# Patient Record
Sex: Female | Born: 1950 | Race: White | Hispanic: No | Marital: Married | Smoking: Never smoker
Health system: Southern US, Community
[De-identification: ages and names within clinical notes are randomized; demographics above are authoritative.]

## PROBLEM LIST (undated history)

## (undated) DIAGNOSIS — F32A Depression, unspecified: Secondary | ICD-10-CM

## (undated) DIAGNOSIS — I499 Cardiac arrhythmia, unspecified: Secondary | ICD-10-CM

## (undated) DIAGNOSIS — K219 Gastro-esophageal reflux disease without esophagitis: Secondary | ICD-10-CM

## (undated) DIAGNOSIS — M1990 Unspecified osteoarthritis, unspecified site: Secondary | ICD-10-CM

## (undated) DIAGNOSIS — F419 Anxiety disorder, unspecified: Secondary | ICD-10-CM

## (undated) DIAGNOSIS — M797 Fibromyalgia: Secondary | ICD-10-CM

## (undated) DIAGNOSIS — E785 Hyperlipidemia, unspecified: Secondary | ICD-10-CM

## (undated) DIAGNOSIS — M7072 Other bursitis of hip, left hip: Secondary | ICD-10-CM

## (undated) DIAGNOSIS — I493 Ventricular premature depolarization: Secondary | ICD-10-CM

## (undated) DIAGNOSIS — F329 Major depressive disorder, single episode, unspecified: Secondary | ICD-10-CM

## (undated) DIAGNOSIS — G35 Multiple sclerosis: Secondary | ICD-10-CM

## (undated) DIAGNOSIS — H9190 Unspecified hearing loss, unspecified ear: Secondary | ICD-10-CM

## (undated) DIAGNOSIS — N814 Uterovaginal prolapse, unspecified: Secondary | ICD-10-CM

## (undated) DIAGNOSIS — H9319 Tinnitus, unspecified ear: Secondary | ICD-10-CM

## (undated) DIAGNOSIS — M5430 Sciatica, unspecified side: Secondary | ICD-10-CM

## (undated) DIAGNOSIS — E039 Hypothyroidism, unspecified: Secondary | ICD-10-CM

## (undated) DIAGNOSIS — I10 Essential (primary) hypertension: Secondary | ICD-10-CM

## (undated) HISTORY — DX: Ventricular premature depolarization: I49.3

## (undated) HISTORY — DX: Sciatica, unspecified side: M54.30

## (undated) HISTORY — DX: Unspecified hearing loss, unspecified ear: H91.90

## (undated) HISTORY — DX: Other bursitis of hip, left hip: M70.72

## (undated) HISTORY — DX: Hyperlipidemia, unspecified: E78.5

## (undated) HISTORY — DX: Essential (primary) hypertension: I10

## (undated) HISTORY — DX: Tinnitus, unspecified ear: H93.19

## (undated) HISTORY — DX: Uterovaginal prolapse, unspecified: N81.4

## (undated) HISTORY — DX: Fibromyalgia: M79.7

## (undated) HISTORY — PX: CATARACT EXTRACTION, BILATERAL: SHX1313

## (undated) HISTORY — DX: Anxiety disorder, unspecified: F41.9

## (undated) HISTORY — DX: Gastro-esophageal reflux disease without esophagitis: K21.9

## (undated) HISTORY — DX: Unspecified osteoarthritis, unspecified site: M19.90

## (undated) HISTORY — DX: Major depressive disorder, single episode, unspecified: F32.9

## (undated) HISTORY — DX: Depression, unspecified: F32.A

## (undated) HISTORY — PX: PARTIAL HYSTERECTOMY: SHX80

## (undated) HISTORY — PX: OTHER SURGICAL HISTORY: SHX169

## (undated) HISTORY — DX: Multiple sclerosis: G35

---

## 1998-06-22 ENCOUNTER — Encounter: Admission: RE | Admit: 1998-06-22 | Discharge: 1998-07-19 | Payer: Self-pay | Admitting: Internal Medicine

## 1998-07-23 ENCOUNTER — Encounter: Admission: RE | Admit: 1998-07-23 | Discharge: 1998-08-29 | Payer: Self-pay

## 1999-10-10 ENCOUNTER — Encounter: Payer: Self-pay | Admitting: Obstetrics and Gynecology

## 1999-10-10 ENCOUNTER — Encounter: Admission: RE | Admit: 1999-10-10 | Discharge: 1999-10-10 | Payer: Self-pay | Admitting: Obstetrics and Gynecology

## 2000-04-29 ENCOUNTER — Encounter: Admission: RE | Admit: 2000-04-29 | Discharge: 2000-07-28 | Payer: Self-pay | Admitting: Internal Medicine

## 2000-11-13 ENCOUNTER — Encounter: Admission: RE | Admit: 2000-11-13 | Discharge: 2000-11-13 | Payer: Self-pay | Admitting: Obstetrics and Gynecology

## 2000-11-13 ENCOUNTER — Encounter: Payer: Self-pay | Admitting: Obstetrics and Gynecology

## 2002-05-24 ENCOUNTER — Encounter: Admission: RE | Admit: 2002-05-24 | Discharge: 2002-05-24 | Payer: Self-pay | Admitting: Cardiology

## 2002-05-24 ENCOUNTER — Encounter: Payer: Self-pay | Admitting: Cardiology

## 2002-05-24 HISTORY — PX: BREAST BIOPSY: SHX20

## 2004-07-26 ENCOUNTER — Encounter: Admission: RE | Admit: 2004-07-26 | Discharge: 2004-07-26 | Payer: Self-pay | Admitting: Obstetrics and Gynecology

## 2008-06-28 ENCOUNTER — Encounter: Payer: Self-pay | Admitting: *Deleted

## 2009-03-05 ENCOUNTER — Encounter: Admission: RE | Admit: 2009-03-05 | Discharge: 2009-03-05 | Payer: Self-pay | Admitting: Internal Medicine

## 2009-08-30 ENCOUNTER — Encounter: Admission: RE | Admit: 2009-08-30 | Discharge: 2009-08-30 | Payer: Self-pay | Admitting: Obstetrics and Gynecology

## 2010-10-07 ENCOUNTER — Other Ambulatory Visit: Payer: Self-pay | Admitting: Internal Medicine

## 2010-10-07 DIAGNOSIS — Z1231 Encounter for screening mammogram for malignant neoplasm of breast: Secondary | ICD-10-CM

## 2010-10-09 ENCOUNTER — Ambulatory Visit
Admission: RE | Admit: 2010-10-09 | Discharge: 2010-10-09 | Disposition: A | Discharge status: Home / Self Care | Payer: BC Managed Care – PPO | Source: Ambulatory Visit | Attending: Internal Medicine | Admitting: Internal Medicine

## 2010-10-09 DIAGNOSIS — Z1231 Encounter for screening mammogram for malignant neoplasm of breast: Secondary | ICD-10-CM

## 2011-05-21 ENCOUNTER — Encounter: Payer: Self-pay | Admitting: *Deleted

## 2011-07-10 ENCOUNTER — Other Ambulatory Visit: Payer: Self-pay | Admitting: Cardiology

## 2011-11-17 ENCOUNTER — Other Ambulatory Visit: Payer: Self-pay | Admitting: Internal Medicine

## 2011-11-17 DIAGNOSIS — Z1231 Encounter for screening mammogram for malignant neoplasm of breast: Secondary | ICD-10-CM

## 2012-01-02 ENCOUNTER — Ambulatory Visit
Admission: RE | Admit: 2012-01-02 | Discharge: 2012-01-02 | Disposition: A | Discharge status: Home / Self Care | Payer: BC Managed Care – PPO | Source: Ambulatory Visit | Attending: Internal Medicine | Admitting: Internal Medicine

## 2012-01-02 DIAGNOSIS — Z1231 Encounter for screening mammogram for malignant neoplasm of breast: Secondary | ICD-10-CM

## 2012-08-13 DIAGNOSIS — S20219A Contusion of unspecified front wall of thorax, initial encounter: Secondary | ICD-10-CM | POA: Diagnosis not present

## 2012-10-05 DIAGNOSIS — F410 Panic disorder [episodic paroxysmal anxiety] without agoraphobia: Secondary | ICD-10-CM | POA: Diagnosis not present

## 2012-10-05 DIAGNOSIS — IMO0001 Reserved for inherently not codable concepts without codable children: Secondary | ICD-10-CM | POA: Diagnosis not present

## 2012-10-05 DIAGNOSIS — R5382 Chronic fatigue, unspecified: Secondary | ICD-10-CM | POA: Diagnosis not present

## 2012-10-05 DIAGNOSIS — Z79899 Other long term (current) drug therapy: Secondary | ICD-10-CM | POA: Diagnosis not present

## 2012-10-05 DIAGNOSIS — E7800 Pure hypercholesterolemia, unspecified: Secondary | ICD-10-CM | POA: Diagnosis not present

## 2012-10-05 DIAGNOSIS — D509 Iron deficiency anemia, unspecified: Secondary | ICD-10-CM | POA: Diagnosis not present

## 2012-11-02 DIAGNOSIS — Z1211 Encounter for screening for malignant neoplasm of colon: Secondary | ICD-10-CM | POA: Diagnosis not present

## 2012-11-08 DIAGNOSIS — K219 Gastro-esophageal reflux disease without esophagitis: Secondary | ICD-10-CM | POA: Diagnosis not present

## 2012-11-08 DIAGNOSIS — R5382 Chronic fatigue, unspecified: Secondary | ICD-10-CM | POA: Diagnosis not present

## 2012-11-08 DIAGNOSIS — Z79899 Other long term (current) drug therapy: Secondary | ICD-10-CM | POA: Diagnosis not present

## 2012-11-08 DIAGNOSIS — Z1331 Encounter for screening for depression: Secondary | ICD-10-CM | POA: Diagnosis not present

## 2012-11-08 DIAGNOSIS — Z0000 Encounter for general adult medical examination without abnormal findings: Secondary | ICD-10-CM | POA: Diagnosis not present

## 2012-11-08 DIAGNOSIS — E039 Hypothyroidism, unspecified: Secondary | ICD-10-CM | POA: Diagnosis not present

## 2012-11-08 DIAGNOSIS — E7800 Pure hypercholesterolemia, unspecified: Secondary | ICD-10-CM | POA: Diagnosis not present

## 2012-11-08 DIAGNOSIS — IMO0001 Reserved for inherently not codable concepts without codable children: Secondary | ICD-10-CM | POA: Diagnosis not present

## 2012-11-08 DIAGNOSIS — B351 Tinea unguium: Secondary | ICD-10-CM | POA: Diagnosis not present

## 2012-11-08 DIAGNOSIS — D509 Iron deficiency anemia, unspecified: Secondary | ICD-10-CM | POA: Diagnosis not present

## 2012-11-24 DIAGNOSIS — M899 Disorder of bone, unspecified: Secondary | ICD-10-CM | POA: Diagnosis not present

## 2012-11-24 DIAGNOSIS — IMO0001 Reserved for inherently not codable concepts without codable children: Secondary | ICD-10-CM | POA: Diagnosis not present

## 2012-11-24 DIAGNOSIS — Z79899 Other long term (current) drug therapy: Secondary | ICD-10-CM | POA: Diagnosis not present

## 2012-12-08 ENCOUNTER — Other Ambulatory Visit: Payer: Self-pay

## 2012-12-08 DIAGNOSIS — Z1231 Encounter for screening mammogram for malignant neoplasm of breast: Secondary | ICD-10-CM

## 2012-12-23 DIAGNOSIS — E7800 Pure hypercholesterolemia, unspecified: Secondary | ICD-10-CM | POA: Diagnosis not present

## 2012-12-23 DIAGNOSIS — Z79899 Other long term (current) drug therapy: Secondary | ICD-10-CM | POA: Diagnosis not present

## 2013-01-17 ENCOUNTER — Ambulatory Visit
Admission: RE | Admit: 2013-01-17 | Discharge: 2013-01-17 | Disposition: A | Discharge status: Home / Self Care | Payer: Medicare Other | Source: Ambulatory Visit

## 2013-01-17 DIAGNOSIS — Z1231 Encounter for screening mammogram for malignant neoplasm of breast: Secondary | ICD-10-CM | POA: Diagnosis not present

## 2013-02-17 DIAGNOSIS — M5137 Other intervertebral disc degeneration, lumbosacral region: Secondary | ICD-10-CM | POA: Diagnosis not present

## 2013-03-07 DIAGNOSIS — H2510 Age-related nuclear cataract, unspecified eye: Secondary | ICD-10-CM | POA: Diagnosis not present

## 2013-07-05 DIAGNOSIS — E039 Hypothyroidism, unspecified: Secondary | ICD-10-CM | POA: Diagnosis not present

## 2013-07-05 DIAGNOSIS — E669 Obesity, unspecified: Secondary | ICD-10-CM | POA: Diagnosis not present

## 2013-07-05 DIAGNOSIS — IMO0001 Reserved for inherently not codable concepts without codable children: Secondary | ICD-10-CM | POA: Diagnosis not present

## 2013-08-29 DIAGNOSIS — E039 Hypothyroidism, unspecified: Secondary | ICD-10-CM | POA: Diagnosis not present

## 2013-08-29 DIAGNOSIS — E669 Obesity, unspecified: Secondary | ICD-10-CM | POA: Diagnosis not present

## 2013-08-29 DIAGNOSIS — Z6827 Body mass index (BMI) 27.0-27.9, adult: Secondary | ICD-10-CM | POA: Diagnosis not present

## 2013-08-29 DIAGNOSIS — IMO0001 Reserved for inherently not codable concepts without codable children: Secondary | ICD-10-CM | POA: Diagnosis not present

## 2013-09-29 DIAGNOSIS — IMO0001 Reserved for inherently not codable concepts without codable children: Secondary | ICD-10-CM | POA: Diagnosis not present

## 2013-09-29 DIAGNOSIS — W19XXXA Unspecified fall, initial encounter: Secondary | ICD-10-CM | POA: Diagnosis not present

## 2013-12-20 DIAGNOSIS — R002 Palpitations: Secondary | ICD-10-CM | POA: Diagnosis not present

## 2013-12-20 DIAGNOSIS — D509 Iron deficiency anemia, unspecified: Secondary | ICD-10-CM | POA: Diagnosis not present

## 2013-12-20 DIAGNOSIS — Z23 Encounter for immunization: Secondary | ICD-10-CM | POA: Diagnosis not present

## 2013-12-20 DIAGNOSIS — M797 Fibromyalgia: Secondary | ICD-10-CM | POA: Diagnosis not present

## 2013-12-20 DIAGNOSIS — E780 Pure hypercholesterolemia: Secondary | ICD-10-CM | POA: Diagnosis not present

## 2013-12-20 DIAGNOSIS — B351 Tinea unguium: Secondary | ICD-10-CM | POA: Diagnosis not present

## 2013-12-20 DIAGNOSIS — E039 Hypothyroidism, unspecified: Secondary | ICD-10-CM | POA: Diagnosis not present

## 2013-12-20 DIAGNOSIS — M353 Polymyalgia rheumatica: Secondary | ICD-10-CM | POA: Diagnosis not present

## 2013-12-20 DIAGNOSIS — F410 Panic disorder [episodic paroxysmal anxiety] without agoraphobia: Secondary | ICD-10-CM | POA: Diagnosis not present

## 2013-12-20 DIAGNOSIS — Z0001 Encounter for general adult medical examination with abnormal findings: Secondary | ICD-10-CM | POA: Diagnosis not present

## 2013-12-21 DIAGNOSIS — H524 Presbyopia: Secondary | ICD-10-CM | POA: Diagnosis not present

## 2013-12-21 DIAGNOSIS — H5201 Hypermetropia, right eye: Secondary | ICD-10-CM | POA: Diagnosis not present

## 2013-12-21 DIAGNOSIS — H52223 Regular astigmatism, bilateral: Secondary | ICD-10-CM | POA: Diagnosis not present

## 2014-01-12 DIAGNOSIS — Z79899 Other long term (current) drug therapy: Secondary | ICD-10-CM | POA: Diagnosis not present

## 2014-01-12 DIAGNOSIS — B351 Tinea unguium: Secondary | ICD-10-CM | POA: Diagnosis not present

## 2014-02-08 DIAGNOSIS — B351 Tinea unguium: Secondary | ICD-10-CM | POA: Diagnosis not present

## 2014-02-08 DIAGNOSIS — Z79899 Other long term (current) drug therapy: Secondary | ICD-10-CM | POA: Diagnosis not present

## 2014-04-24 DIAGNOSIS — E039 Hypothyroidism, unspecified: Secondary | ICD-10-CM | POA: Diagnosis not present

## 2014-04-24 DIAGNOSIS — M797 Fibromyalgia: Secondary | ICD-10-CM | POA: Diagnosis not present

## 2014-04-24 DIAGNOSIS — R030 Elevated blood-pressure reading, without diagnosis of hypertension: Secondary | ICD-10-CM | POA: Diagnosis not present

## 2014-04-24 DIAGNOSIS — K219 Gastro-esophageal reflux disease without esophagitis: Secondary | ICD-10-CM | POA: Diagnosis not present

## 2014-04-24 DIAGNOSIS — F410 Panic disorder [episodic paroxysmal anxiety] without agoraphobia: Secondary | ICD-10-CM | POA: Diagnosis not present

## 2014-04-24 DIAGNOSIS — B351 Tinea unguium: Secondary | ICD-10-CM | POA: Diagnosis not present

## 2014-05-18 DIAGNOSIS — M5442 Lumbago with sciatica, left side: Secondary | ICD-10-CM | POA: Diagnosis not present

## 2014-05-18 DIAGNOSIS — M5136 Other intervertebral disc degeneration, lumbar region: Secondary | ICD-10-CM | POA: Diagnosis not present

## 2014-05-26 DIAGNOSIS — M5136 Other intervertebral disc degeneration, lumbar region: Secondary | ICD-10-CM | POA: Diagnosis not present

## 2014-06-01 DIAGNOSIS — M5136 Other intervertebral disc degeneration, lumbar region: Secondary | ICD-10-CM | POA: Diagnosis not present

## 2014-06-14 DIAGNOSIS — R1012 Left upper quadrant pain: Secondary | ICD-10-CM | POA: Diagnosis not present

## 2014-09-25 DIAGNOSIS — M5442 Lumbago with sciatica, left side: Secondary | ICD-10-CM | POA: Diagnosis not present

## 2014-09-25 DIAGNOSIS — M5136 Other intervertebral disc degeneration, lumbar region: Secondary | ICD-10-CM | POA: Diagnosis not present

## 2014-10-18 DIAGNOSIS — M5136 Other intervertebral disc degeneration, lumbar region: Secondary | ICD-10-CM | POA: Diagnosis not present

## 2015-01-16 ENCOUNTER — Other Ambulatory Visit: Payer: Self-pay | Admitting: Internal Medicine

## 2015-01-16 DIAGNOSIS — Z23 Encounter for immunization: Secondary | ICD-10-CM | POA: Diagnosis not present

## 2015-01-16 DIAGNOSIS — Z1231 Encounter for screening mammogram for malignant neoplasm of breast: Secondary | ICD-10-CM

## 2015-01-16 DIAGNOSIS — Z79899 Other long term (current) drug therapy: Secondary | ICD-10-CM | POA: Diagnosis not present

## 2015-01-16 DIAGNOSIS — H9190 Unspecified hearing loss, unspecified ear: Secondary | ICD-10-CM | POA: Diagnosis not present

## 2015-01-16 DIAGNOSIS — B351 Tinea unguium: Secondary | ICD-10-CM | POA: Diagnosis not present

## 2015-01-16 DIAGNOSIS — D509 Iron deficiency anemia, unspecified: Secondary | ICD-10-CM | POA: Diagnosis not present

## 2015-01-16 DIAGNOSIS — E7800 Pure hypercholesterolemia, unspecified: Secondary | ICD-10-CM | POA: Diagnosis not present

## 2015-01-16 DIAGNOSIS — M5431 Sciatica, right side: Secondary | ICD-10-CM | POA: Diagnosis not present

## 2015-01-16 DIAGNOSIS — M797 Fibromyalgia: Secondary | ICD-10-CM | POA: Diagnosis not present

## 2015-01-16 DIAGNOSIS — F329 Major depressive disorder, single episode, unspecified: Secondary | ICD-10-CM | POA: Diagnosis not present

## 2015-01-16 DIAGNOSIS — Z0001 Encounter for general adult medical examination with abnormal findings: Secondary | ICD-10-CM | POA: Diagnosis not present

## 2015-01-16 DIAGNOSIS — H9319 Tinnitus, unspecified ear: Secondary | ICD-10-CM | POA: Diagnosis not present

## 2015-01-16 DIAGNOSIS — Z0000 Encounter for general adult medical examination without abnormal findings: Secondary | ICD-10-CM | POA: Diagnosis not present

## 2015-01-16 DIAGNOSIS — E039 Hypothyroidism, unspecified: Secondary | ICD-10-CM | POA: Diagnosis not present

## 2015-01-18 DIAGNOSIS — M5431 Sciatica, right side: Secondary | ICD-10-CM | POA: Diagnosis not present

## 2015-01-22 DIAGNOSIS — M5431 Sciatica, right side: Secondary | ICD-10-CM | POA: Diagnosis not present

## 2015-01-29 DIAGNOSIS — M5431 Sciatica, right side: Secondary | ICD-10-CM | POA: Diagnosis not present

## 2015-01-31 DIAGNOSIS — M5431 Sciatica, right side: Secondary | ICD-10-CM | POA: Diagnosis not present

## 2015-02-09 DIAGNOSIS — M5431 Sciatica, right side: Secondary | ICD-10-CM | POA: Diagnosis not present

## 2015-02-19 ENCOUNTER — Ambulatory Visit
Admission: RE | Admit: 2015-02-19 | Discharge: 2015-02-19 | Disposition: A | Discharge status: Home / Self Care | Payer: Medicare Other | Source: Ambulatory Visit | Attending: Internal Medicine | Admitting: Internal Medicine

## 2015-02-19 DIAGNOSIS — M8589 Other specified disorders of bone density and structure, multiple sites: Secondary | ICD-10-CM | POA: Diagnosis not present

## 2015-02-19 DIAGNOSIS — M859 Disorder of bone density and structure, unspecified: Secondary | ICD-10-CM | POA: Diagnosis not present

## 2015-02-19 DIAGNOSIS — Z1231 Encounter for screening mammogram for malignant neoplasm of breast: Secondary | ICD-10-CM

## 2015-03-22 DIAGNOSIS — H5201 Hypermetropia, right eye: Secondary | ICD-10-CM | POA: Diagnosis not present

## 2015-03-22 DIAGNOSIS — H524 Presbyopia: Secondary | ICD-10-CM | POA: Diagnosis not present

## 2015-03-22 DIAGNOSIS — H52223 Regular astigmatism, bilateral: Secondary | ICD-10-CM | POA: Diagnosis not present

## 2015-03-22 DIAGNOSIS — H5212 Myopia, left eye: Secondary | ICD-10-CM | POA: Diagnosis not present

## 2015-03-22 DIAGNOSIS — H2513 Age-related nuclear cataract, bilateral: Secondary | ICD-10-CM | POA: Diagnosis not present

## 2015-03-28 DIAGNOSIS — R079 Chest pain, unspecified: Secondary | ICD-10-CM | POA: Diagnosis not present

## 2015-03-28 DIAGNOSIS — M5431 Sciatica, right side: Secondary | ICD-10-CM | POA: Diagnosis not present

## 2015-04-03 ENCOUNTER — Other Ambulatory Visit: Payer: Self-pay | Admitting: Internal Medicine

## 2015-04-03 ENCOUNTER — Ambulatory Visit
Admission: RE | Admit: 2015-04-03 | Discharge: 2015-04-03 | Disposition: A | Discharge status: Home / Self Care | Payer: Medicare Other | Source: Ambulatory Visit | Attending: Internal Medicine | Admitting: Internal Medicine

## 2015-04-03 DIAGNOSIS — R079 Chest pain, unspecified: Secondary | ICD-10-CM

## 2015-04-03 DIAGNOSIS — S299XXA Unspecified injury of thorax, initial encounter: Secondary | ICD-10-CM | POA: Diagnosis not present

## 2015-04-03 DIAGNOSIS — R0781 Pleurodynia: Secondary | ICD-10-CM | POA: Diagnosis not present

## 2015-06-05 ENCOUNTER — Other Ambulatory Visit: Payer: Self-pay | Admitting: Internal Medicine

## 2015-06-05 DIAGNOSIS — M5416 Radiculopathy, lumbar region: Secondary | ICD-10-CM

## 2015-06-16 ENCOUNTER — Ambulatory Visit
Admission: RE | Admit: 2015-06-16 | Discharge: 2015-06-16 | Disposition: A | Discharge status: Home / Self Care | Payer: Medicare Other | Source: Ambulatory Visit | Attending: Internal Medicine | Admitting: Internal Medicine

## 2015-06-16 DIAGNOSIS — M5416 Radiculopathy, lumbar region: Secondary | ICD-10-CM

## 2015-06-16 DIAGNOSIS — M4806 Spinal stenosis, lumbar region: Secondary | ICD-10-CM | POA: Diagnosis not present

## 2015-06-21 ENCOUNTER — Other Ambulatory Visit: Payer: Self-pay | Admitting: Internal Medicine

## 2015-06-21 DIAGNOSIS — M5124 Other intervertebral disc displacement, thoracic region: Secondary | ICD-10-CM

## 2015-06-28 ENCOUNTER — Ambulatory Visit
Admission: RE | Admit: 2015-06-28 | Discharge: 2015-06-28 | Disposition: A | Discharge status: Home / Self Care | Payer: Medicare Other | Source: Ambulatory Visit | Attending: Internal Medicine | Admitting: Internal Medicine

## 2015-06-28 DIAGNOSIS — M5124 Other intervertebral disc displacement, thoracic region: Secondary | ICD-10-CM | POA: Diagnosis not present

## 2015-07-02 DIAGNOSIS — M546 Pain in thoracic spine: Secondary | ICD-10-CM | POA: Diagnosis not present

## 2015-09-26 DIAGNOSIS — M25552 Pain in left hip: Secondary | ICD-10-CM | POA: Diagnosis not present

## 2015-09-26 DIAGNOSIS — M545 Low back pain: Secondary | ICD-10-CM | POA: Diagnosis not present

## 2015-09-26 DIAGNOSIS — M791 Myalgia: Secondary | ICD-10-CM | POA: Diagnosis not present

## 2015-09-26 DIAGNOSIS — M542 Cervicalgia: Secondary | ICD-10-CM | POA: Diagnosis not present

## 2015-09-26 DIAGNOSIS — M50221 Other cervical disc displacement at C4-C5 level: Secondary | ICD-10-CM | POA: Diagnosis not present

## 2015-09-26 DIAGNOSIS — M25551 Pain in right hip: Secondary | ICD-10-CM | POA: Diagnosis not present

## 2015-09-26 DIAGNOSIS — M6281 Muscle weakness (generalized): Secondary | ICD-10-CM | POA: Diagnosis not present

## 2015-09-26 DIAGNOSIS — M47812 Spondylosis without myelopathy or radiculopathy, cervical region: Secondary | ICD-10-CM | POA: Diagnosis not present

## 2015-10-15 DIAGNOSIS — M791 Myalgia: Secondary | ICD-10-CM | POA: Diagnosis not present

## 2015-10-15 DIAGNOSIS — M47812 Spondylosis without myelopathy or radiculopathy, cervical region: Secondary | ICD-10-CM | POA: Diagnosis not present

## 2015-10-15 DIAGNOSIS — M542 Cervicalgia: Secondary | ICD-10-CM | POA: Diagnosis not present

## 2015-10-15 DIAGNOSIS — M25552 Pain in left hip: Secondary | ICD-10-CM | POA: Diagnosis not present

## 2015-10-15 DIAGNOSIS — M50221 Other cervical disc displacement at C4-C5 level: Secondary | ICD-10-CM | POA: Diagnosis not present

## 2015-10-15 DIAGNOSIS — M6281 Muscle weakness (generalized): Secondary | ICD-10-CM | POA: Diagnosis not present

## 2015-10-15 DIAGNOSIS — M545 Low back pain: Secondary | ICD-10-CM | POA: Diagnosis not present

## 2015-10-15 DIAGNOSIS — M25551 Pain in right hip: Secondary | ICD-10-CM | POA: Diagnosis not present

## 2015-10-17 DIAGNOSIS — M461 Sacroiliitis, not elsewhere classified: Secondary | ICD-10-CM | POA: Diagnosis not present

## 2015-10-17 DIAGNOSIS — M47812 Spondylosis without myelopathy or radiculopathy, cervical region: Secondary | ICD-10-CM | POA: Diagnosis not present

## 2015-10-17 DIAGNOSIS — M50221 Other cervical disc displacement at C4-C5 level: Secondary | ICD-10-CM | POA: Diagnosis not present

## 2015-10-17 DIAGNOSIS — M6281 Muscle weakness (generalized): Secondary | ICD-10-CM | POA: Diagnosis not present

## 2015-10-17 DIAGNOSIS — M791 Myalgia: Secondary | ICD-10-CM | POA: Diagnosis not present

## 2015-10-22 DIAGNOSIS — M791 Myalgia: Secondary | ICD-10-CM | POA: Diagnosis not present

## 2015-10-22 DIAGNOSIS — M50221 Other cervical disc displacement at C4-C5 level: Secondary | ICD-10-CM | POA: Diagnosis not present

## 2015-10-22 DIAGNOSIS — M47812 Spondylosis without myelopathy or radiculopathy, cervical region: Secondary | ICD-10-CM | POA: Diagnosis not present

## 2015-10-22 DIAGNOSIS — M542 Cervicalgia: Secondary | ICD-10-CM | POA: Diagnosis not present

## 2015-10-22 DIAGNOSIS — M6281 Muscle weakness (generalized): Secondary | ICD-10-CM | POA: Diagnosis not present

## 2015-10-24 DIAGNOSIS — M50221 Other cervical disc displacement at C4-C5 level: Secondary | ICD-10-CM | POA: Diagnosis not present

## 2015-10-24 DIAGNOSIS — M6281 Muscle weakness (generalized): Secondary | ICD-10-CM | POA: Diagnosis not present

## 2015-10-24 DIAGNOSIS — M47812 Spondylosis without myelopathy or radiculopathy, cervical region: Secondary | ICD-10-CM | POA: Diagnosis not present

## 2015-10-24 DIAGNOSIS — M791 Myalgia: Secondary | ICD-10-CM | POA: Diagnosis not present

## 2015-10-24 DIAGNOSIS — M461 Sacroiliitis, not elsewhere classified: Secondary | ICD-10-CM | POA: Diagnosis not present

## 2015-10-28 DIAGNOSIS — S61412A Laceration without foreign body of left hand, initial encounter: Secondary | ICD-10-CM | POA: Diagnosis not present

## 2015-10-29 DIAGNOSIS — M6281 Muscle weakness (generalized): Secondary | ICD-10-CM | POA: Diagnosis not present

## 2015-10-29 DIAGNOSIS — M791 Myalgia: Secondary | ICD-10-CM | POA: Diagnosis not present

## 2015-10-29 DIAGNOSIS — M50221 Other cervical disc displacement at C4-C5 level: Secondary | ICD-10-CM | POA: Diagnosis not present

## 2015-10-29 DIAGNOSIS — M542 Cervicalgia: Secondary | ICD-10-CM | POA: Diagnosis not present

## 2015-10-29 DIAGNOSIS — M47812 Spondylosis without myelopathy or radiculopathy, cervical region: Secondary | ICD-10-CM | POA: Diagnosis not present

## 2015-10-31 DIAGNOSIS — M6281 Muscle weakness (generalized): Secondary | ICD-10-CM | POA: Diagnosis not present

## 2015-10-31 DIAGNOSIS — M791 Myalgia: Secondary | ICD-10-CM | POA: Diagnosis not present

## 2015-10-31 DIAGNOSIS — M47812 Spondylosis without myelopathy or radiculopathy, cervical region: Secondary | ICD-10-CM | POA: Diagnosis not present

## 2015-10-31 DIAGNOSIS — M461 Sacroiliitis, not elsewhere classified: Secondary | ICD-10-CM | POA: Diagnosis not present

## 2015-10-31 DIAGNOSIS — M50221 Other cervical disc displacement at C4-C5 level: Secondary | ICD-10-CM | POA: Diagnosis not present

## 2015-11-07 DIAGNOSIS — H833X1 Noise effects on right inner ear: Secondary | ICD-10-CM | POA: Diagnosis not present

## 2015-11-07 DIAGNOSIS — H5371 Glare sensitivity: Secondary | ICD-10-CM | POA: Diagnosis not present

## 2015-11-07 DIAGNOSIS — G44201 Tension-type headache, unspecified, intractable: Secondary | ICD-10-CM | POA: Diagnosis not present

## 2015-11-07 DIAGNOSIS — H833X2 Noise effects on left inner ear: Secondary | ICD-10-CM | POA: Diagnosis not present

## 2015-11-07 DIAGNOSIS — F411 Generalized anxiety disorder: Secondary | ICD-10-CM | POA: Diagnosis not present

## 2015-11-07 DIAGNOSIS — R42 Dizziness and giddiness: Secondary | ICD-10-CM | POA: Diagnosis not present

## 2015-11-12 DIAGNOSIS — M791 Myalgia: Secondary | ICD-10-CM | POA: Diagnosis not present

## 2015-11-12 DIAGNOSIS — M47812 Spondylosis without myelopathy or radiculopathy, cervical region: Secondary | ICD-10-CM | POA: Diagnosis not present

## 2015-11-12 DIAGNOSIS — M50221 Other cervical disc displacement at C4-C5 level: Secondary | ICD-10-CM | POA: Diagnosis not present

## 2015-11-12 DIAGNOSIS — M6281 Muscle weakness (generalized): Secondary | ICD-10-CM | POA: Diagnosis not present

## 2015-11-12 DIAGNOSIS — M461 Sacroiliitis, not elsewhere classified: Secondary | ICD-10-CM | POA: Diagnosis not present

## 2015-11-14 DIAGNOSIS — M50221 Other cervical disc displacement at C4-C5 level: Secondary | ICD-10-CM | POA: Diagnosis not present

## 2015-11-14 DIAGNOSIS — M542 Cervicalgia: Secondary | ICD-10-CM | POA: Diagnosis not present

## 2015-11-14 DIAGNOSIS — M791 Myalgia: Secondary | ICD-10-CM | POA: Diagnosis not present

## 2015-11-14 DIAGNOSIS — M47812 Spondylosis without myelopathy or radiculopathy, cervical region: Secondary | ICD-10-CM | POA: Diagnosis not present

## 2015-11-14 DIAGNOSIS — M6281 Muscle weakness (generalized): Secondary | ICD-10-CM | POA: Diagnosis not present

## 2015-11-19 DIAGNOSIS — M47812 Spondylosis without myelopathy or radiculopathy, cervical region: Secondary | ICD-10-CM | POA: Diagnosis not present

## 2015-11-19 DIAGNOSIS — M50221 Other cervical disc displacement at C4-C5 level: Secondary | ICD-10-CM | POA: Diagnosis not present

## 2015-11-19 DIAGNOSIS — M791 Myalgia: Secondary | ICD-10-CM | POA: Diagnosis not present

## 2015-11-19 DIAGNOSIS — M461 Sacroiliitis, not elsewhere classified: Secondary | ICD-10-CM | POA: Diagnosis not present

## 2015-11-19 DIAGNOSIS — M6281 Muscle weakness (generalized): Secondary | ICD-10-CM | POA: Diagnosis not present

## 2015-11-21 DIAGNOSIS — M6281 Muscle weakness (generalized): Secondary | ICD-10-CM | POA: Diagnosis not present

## 2015-11-21 DIAGNOSIS — M791 Myalgia: Secondary | ICD-10-CM | POA: Diagnosis not present

## 2015-11-21 DIAGNOSIS — M50221 Other cervical disc displacement at C4-C5 level: Secondary | ICD-10-CM | POA: Diagnosis not present

## 2015-11-21 DIAGNOSIS — M47812 Spondylosis without myelopathy or radiculopathy, cervical region: Secondary | ICD-10-CM | POA: Diagnosis not present

## 2015-11-26 DIAGNOSIS — M791 Myalgia: Secondary | ICD-10-CM | POA: Diagnosis not present

## 2015-11-26 DIAGNOSIS — M47812 Spondylosis without myelopathy or radiculopathy, cervical region: Secondary | ICD-10-CM | POA: Diagnosis not present

## 2015-11-26 DIAGNOSIS — M50221 Other cervical disc displacement at C4-C5 level: Secondary | ICD-10-CM | POA: Diagnosis not present

## 2015-11-26 DIAGNOSIS — M6281 Muscle weakness (generalized): Secondary | ICD-10-CM | POA: Diagnosis not present

## 2015-11-26 DIAGNOSIS — M461 Sacroiliitis, not elsewhere classified: Secondary | ICD-10-CM | POA: Diagnosis not present

## 2015-11-28 DIAGNOSIS — M791 Myalgia: Secondary | ICD-10-CM | POA: Diagnosis not present

## 2015-11-28 DIAGNOSIS — M2560 Stiffness of unspecified joint, not elsewhere classified: Secondary | ICD-10-CM | POA: Diagnosis not present

## 2015-11-28 DIAGNOSIS — M5127 Other intervertebral disc displacement, lumbosacral region: Secondary | ICD-10-CM | POA: Diagnosis not present

## 2015-11-28 DIAGNOSIS — M5137 Other intervertebral disc degeneration, lumbosacral region: Secondary | ICD-10-CM | POA: Diagnosis not present

## 2015-12-03 DIAGNOSIS — R42 Dizziness and giddiness: Secondary | ICD-10-CM | POA: Diagnosis not present

## 2015-12-03 DIAGNOSIS — E669 Obesity, unspecified: Secondary | ICD-10-CM | POA: Diagnosis not present

## 2015-12-03 DIAGNOSIS — W19XXXA Unspecified fall, initial encounter: Secondary | ICD-10-CM | POA: Diagnosis not present

## 2015-12-03 DIAGNOSIS — M2560 Stiffness of unspecified joint, not elsewhere classified: Secondary | ICD-10-CM | POA: Diagnosis not present

## 2015-12-03 DIAGNOSIS — I10 Essential (primary) hypertension: Secondary | ICD-10-CM | POA: Diagnosis not present

## 2015-12-03 DIAGNOSIS — Z79890 Hormone replacement therapy (postmenopausal): Secondary | ICD-10-CM | POA: Diagnosis not present

## 2015-12-03 DIAGNOSIS — M5137 Other intervertebral disc degeneration, lumbosacral region: Secondary | ICD-10-CM | POA: Diagnosis not present

## 2015-12-03 DIAGNOSIS — M791 Myalgia: Secondary | ICD-10-CM | POA: Diagnosis not present

## 2015-12-03 DIAGNOSIS — Z6828 Body mass index (BMI) 28.0-28.9, adult: Secondary | ICD-10-CM | POA: Diagnosis not present

## 2015-12-03 DIAGNOSIS — M797 Fibromyalgia: Secondary | ICD-10-CM | POA: Diagnosis not present

## 2015-12-03 DIAGNOSIS — M5127 Other intervertebral disc displacement, lumbosacral region: Secondary | ICD-10-CM | POA: Diagnosis not present

## 2015-12-05 DIAGNOSIS — M791 Myalgia: Secondary | ICD-10-CM | POA: Diagnosis not present

## 2015-12-05 DIAGNOSIS — M5137 Other intervertebral disc degeneration, lumbosacral region: Secondary | ICD-10-CM | POA: Diagnosis not present

## 2015-12-05 DIAGNOSIS — M2560 Stiffness of unspecified joint, not elsewhere classified: Secondary | ICD-10-CM | POA: Diagnosis not present

## 2015-12-05 DIAGNOSIS — M5127 Other intervertebral disc displacement, lumbosacral region: Secondary | ICD-10-CM | POA: Diagnosis not present

## 2015-12-10 DIAGNOSIS — M5127 Other intervertebral disc displacement, lumbosacral region: Secondary | ICD-10-CM | POA: Diagnosis not present

## 2015-12-10 DIAGNOSIS — M2560 Stiffness of unspecified joint, not elsewhere classified: Secondary | ICD-10-CM | POA: Diagnosis not present

## 2015-12-10 DIAGNOSIS — M791 Myalgia: Secondary | ICD-10-CM | POA: Diagnosis not present

## 2015-12-10 DIAGNOSIS — M5137 Other intervertebral disc degeneration, lumbosacral region: Secondary | ICD-10-CM | POA: Diagnosis not present

## 2015-12-12 DIAGNOSIS — M791 Myalgia: Secondary | ICD-10-CM | POA: Diagnosis not present

## 2015-12-12 DIAGNOSIS — M5127 Other intervertebral disc displacement, lumbosacral region: Secondary | ICD-10-CM | POA: Diagnosis not present

## 2015-12-12 DIAGNOSIS — M5137 Other intervertebral disc degeneration, lumbosacral region: Secondary | ICD-10-CM | POA: Diagnosis not present

## 2015-12-12 DIAGNOSIS — M2560 Stiffness of unspecified joint, not elsewhere classified: Secondary | ICD-10-CM | POA: Diagnosis not present

## 2015-12-17 DIAGNOSIS — M5137 Other intervertebral disc degeneration, lumbosacral region: Secondary | ICD-10-CM | POA: Diagnosis not present

## 2015-12-17 DIAGNOSIS — M5127 Other intervertebral disc displacement, lumbosacral region: Secondary | ICD-10-CM | POA: Diagnosis not present

## 2015-12-17 DIAGNOSIS — M791 Myalgia: Secondary | ICD-10-CM | POA: Diagnosis not present

## 2015-12-17 DIAGNOSIS — M2560 Stiffness of unspecified joint, not elsewhere classified: Secondary | ICD-10-CM | POA: Diagnosis not present

## 2015-12-19 DIAGNOSIS — M5127 Other intervertebral disc displacement, lumbosacral region: Secondary | ICD-10-CM | POA: Diagnosis not present

## 2015-12-19 DIAGNOSIS — M2560 Stiffness of unspecified joint, not elsewhere classified: Secondary | ICD-10-CM | POA: Diagnosis not present

## 2015-12-19 DIAGNOSIS — M791 Myalgia: Secondary | ICD-10-CM | POA: Diagnosis not present

## 2015-12-19 DIAGNOSIS — M5137 Other intervertebral disc degeneration, lumbosacral region: Secondary | ICD-10-CM | POA: Diagnosis not present

## 2015-12-24 DIAGNOSIS — M2560 Stiffness of unspecified joint, not elsewhere classified: Secondary | ICD-10-CM | POA: Diagnosis not present

## 2015-12-24 DIAGNOSIS — M5137 Other intervertebral disc degeneration, lumbosacral region: Secondary | ICD-10-CM | POA: Diagnosis not present

## 2015-12-24 DIAGNOSIS — M791 Myalgia: Secondary | ICD-10-CM | POA: Diagnosis not present

## 2015-12-24 DIAGNOSIS — M5127 Other intervertebral disc displacement, lumbosacral region: Secondary | ICD-10-CM | POA: Diagnosis not present

## 2015-12-31 DIAGNOSIS — R202 Paresthesia of skin: Secondary | ICD-10-CM | POA: Diagnosis not present

## 2015-12-31 DIAGNOSIS — M4726 Other spondylosis with radiculopathy, lumbar region: Secondary | ICD-10-CM | POA: Diagnosis not present

## 2016-01-02 DIAGNOSIS — M5137 Other intervertebral disc degeneration, lumbosacral region: Secondary | ICD-10-CM | POA: Diagnosis not present

## 2016-01-02 DIAGNOSIS — M5127 Other intervertebral disc displacement, lumbosacral region: Secondary | ICD-10-CM | POA: Diagnosis not present

## 2016-01-02 DIAGNOSIS — M791 Myalgia: Secondary | ICD-10-CM | POA: Diagnosis not present

## 2016-01-02 DIAGNOSIS — M2560 Stiffness of unspecified joint, not elsewhere classified: Secondary | ICD-10-CM | POA: Diagnosis not present

## 2016-01-04 DIAGNOSIS — M2560 Stiffness of unspecified joint, not elsewhere classified: Secondary | ICD-10-CM | POA: Diagnosis not present

## 2016-01-04 DIAGNOSIS — M5137 Other intervertebral disc degeneration, lumbosacral region: Secondary | ICD-10-CM | POA: Diagnosis not present

## 2016-01-04 DIAGNOSIS — M791 Myalgia: Secondary | ICD-10-CM | POA: Diagnosis not present

## 2016-01-04 DIAGNOSIS — M5127 Other intervertebral disc displacement, lumbosacral region: Secondary | ICD-10-CM | POA: Diagnosis not present

## 2016-01-09 DIAGNOSIS — M5127 Other intervertebral disc displacement, lumbosacral region: Secondary | ICD-10-CM | POA: Diagnosis not present

## 2016-01-09 DIAGNOSIS — M5137 Other intervertebral disc degeneration, lumbosacral region: Secondary | ICD-10-CM | POA: Diagnosis not present

## 2016-01-09 DIAGNOSIS — M791 Myalgia: Secondary | ICD-10-CM | POA: Diagnosis not present

## 2016-01-09 DIAGNOSIS — M2560 Stiffness of unspecified joint, not elsewhere classified: Secondary | ICD-10-CM | POA: Diagnosis not present

## 2016-01-22 ENCOUNTER — Encounter: Payer: Self-pay | Admitting: Neurology

## 2016-01-22 ENCOUNTER — Ambulatory Visit (INDEPENDENT_AMBULATORY_CARE_PROVIDER_SITE_OTHER): Payer: Medicare Other | Admitting: Neurology

## 2016-01-22 VITALS — BP 122/77 | HR 84 | Resp 20 | Ht 67.0 in | Wt 178.0 lb

## 2016-01-22 DIAGNOSIS — H9193 Unspecified hearing loss, bilateral: Secondary | ICD-10-CM

## 2016-01-22 DIAGNOSIS — R296 Repeated falls: Secondary | ICD-10-CM

## 2016-01-22 DIAGNOSIS — R270 Ataxia, unspecified: Secondary | ICD-10-CM

## 2016-01-22 DIAGNOSIS — G40309 Generalized idiopathic epilepsy and epileptic syndromes, not intractable, without status epilepticus: Secondary | ICD-10-CM

## 2016-01-22 DIAGNOSIS — H9313 Tinnitus, bilateral: Secondary | ICD-10-CM

## 2016-01-22 DIAGNOSIS — G40A09 Absence epileptic syndrome, not intractable, without status epilepticus: Secondary | ICD-10-CM

## 2016-01-22 NOTE — Progress Notes (Signed)
Provider:  Melvyn Novas, M D  Referring Provider: Marden Noble, MD Primary Care Physician:  Pearla Dubonnet, MD  Chief Complaint  Patient presents with  . New Patient (Initial Visit)    falls, dizziness    HPI:  Summer Holloway is a 66 y.o. female , seen here as a referral/ revisit  from Dr. Kevan Ny for evaluation of dysequilibrium.   Chief complaint according to patient : "about 2 years of unsteadiness" -  Summer Holloway is seen here today in the presence of her husband and was referred her primary care physician Dr. Kevan Ny for evaluation of disequilibrium, unsteady gait, possible ataxia. She carries a diagnosis of weight gain, hypertension, she also has been followed by Dr. Kevan Ny for fibromyalgia, insomnia and has been treated for depression. She has been taking Lyrica and amitriptyline.  The chronic disequilibrium begun according to Dr. Kevan Ny notes, 5 years now and is unsteady but the patient walks or is in motion. Sometimes she has have to hold onto her husband's arm to steady her gait. She feels as it is being pulled into a different direction, cannot walk a straight line. She jokingly reports having no chance to pass a drunk driving test. She is only experiencing this pulling when in motion and not at rest. She does not describe vertigo. When anxious she is much more off balance!  She is pleasant and conversant, reports that she is afraid of falling. She has not been evaluated by ENT. She does have hearing loss and a chronic tinnitus  ( a constant high pitches sound ) for 35 years.  She had no MRI of the brain and has no family history of schwannoma.   Maternal aunt clarabelle had ataxia, vertigo and auditory hallucinations. Mother had anxiety.    Social history:  Married, non smoker, non ETOH, use, caffeine , none.  Retired, Comptroller.   Review of Systems: Out of a complete 14 system review, the patient complains of only the following symptoms, and all other reviewed  systems are negative.  Weight gain, fatigue, palpitations, hearing loss, tinnitus, sometimes spinning sensation but not when not in motion. Blurred vision, snoring, urinary urgency, increased thirst, flushing, joint pain and aching muscles runny nose, depression anxiety, death interest in activities headaches confusion at times memory loss or amnesia, snoring. Hypertension high cholesterol depression anxiety and fibromyalgia ovarian cyst and hysterectomy.  Social History   Social History  . Marital status: Married    Spouse name: N/A  . Number of children: N/A  . Years of education: N/A   Occupational History  . Not on file.   Social History Main Topics  . Smoking status: Never Smoker  . Smokeless tobacco: Never Used  . Alcohol use No  . Drug use: Unknown  . Sexual activity: Not on file   Other Topics Concern  . Not on file   Social History Narrative  . No narrative on file    Family History  Problem Relation Age of Onset  . Fibromyalgia Mother   . Heart attack Father   . CAD Father     Past Medical History:  Diagnosis Date  . Anxiety   . Bursitis of left hip   . Depression   . Esophageal reflux   . Fibromyalgia   . Hearing loss   . Hyperlipemia   . Hyperlipidemia   . Hypertension   . Osteoarthritis   . PVC (premature ventricular contraction)   . Sciatica   . Tinnitus  Past Surgical History:  Procedure Laterality Date  . CATARACT EXTRACTION, BILATERAL    . PARTIAL HYSTERECTOMY      Current Outpatient Prescriptions  Medication Sig Dispense Refill  . ALPRAZolam (XANAX) 0.25 MG tablet Take 0.25 mg by mouth daily as needed for anxiety. 1-2 tablets at onset of panic.    Marland Kitchen amitriptyline (ELAVIL) 50 MG tablet Take 25 mg by mouth at bedtime.    Marland Kitchen amLODipine (NORVASC) 5 MG tablet Take 5 mg by mouth daily.    Marland Kitchen buPROPion (WELLBUTRIN XL) 150 MG 24 hr tablet Take 150 mg by mouth daily.    . DULoxetine (CYMBALTA) 60 MG capsule Take 60 mg by mouth daily.    Marland Kitchen  escitalopram (LEXAPRO) 10 MG tablet Take 10 mg by mouth 2 (two) times daily.    Marland Kitchen estradiol (ESTRACE) 1 MG tablet Take 0.5 mg by mouth daily.   3  . FLUoxetine (PROZAC) 40 MG capsule Take 40 mg by mouth daily.    Marland Kitchen HYDROcodone-acetaminophen (NORCO) 10-325 MG tablet Take 1 tablet by mouth every 12 (twelve) hours as needed.    Marland Kitchen levothyroxine (SYNTHROID, LEVOTHROID) 50 MCG tablet Take 50 mcg by mouth daily before breakfast.    . pantoprazole (PROTONIX) 40 MG tablet Take 40 mg by mouth daily.    . pregabalin (LYRICA) 100 MG capsule Take 100 mg by mouth 2 (two) times daily. 2 capsules in am and 1 capsule in pm    . propranolol (INDERAL) 40 MG tablet Take 20 mg by mouth 2 (two) times daily.    . rosuvastatin (CRESTOR) 5 MG tablet Take 5 mg by mouth daily.     No current facility-administered medications for this visit.     Allergies as of 01/22/2016  . (No Known Allergies)    Vitals: BP 122/77   Pulse 84   Resp 20   Ht 5\' 7"  (1.702 m)   Wt 178 lb (80.7 kg)   BMI 27.88 kg/m  Last Weight:  Wt Readings from Last 1 Encounters:  01/22/16 178 lb (80.7 kg)   ZOX:WRUE mass index is 27.88 kg/m.     Last Height:   Ht Readings from Last 1 Encounters:  01/22/16 5\' 7"  (1.702 m)    Physical exam:  General: The patient is awake, alert and appears not in acute distress. The patient is well groomed. Head: Normocephalic, atraumatic. Neck is supple. Mallampati 2  neck circumference: 14.5 . Nasal airflow patent , Cardiovascular:  Regular rate and rhythm , without  murmurs or carotid bruit, and without distended neck veins. Respiratory: Lungs are clear to auscultation. Skin:  Without evidence of edema, or rash Trunk: BMI is 28 The patient's posture is erect, hyperlordosis   Neurologic exam : The patient is awake and alert, oriented to place and time.     Attention span & concentration ability appears normal.  Speech is fluent,  without dysarthria, dysphonia or aphasia.  Mood and affect are  appropriate.  Cranial nerves: Pupils are equal and briskly reactive to light. Funduscopic exam without  evidence of pallor or edema.  Extraocular movements  in vertical and horizontal planes intact and without nystagmus. Visual fields by finger perimetry are intact. Hearing to finger rub intact.   Facial sensation intact to fine touch.  Facial motor strength is symmetric and tongue and uvula move midline. Shoulder shrug was symmetrical.   Motor exam:   Normal tone, muscle bulk and symmetric strength in all extremities. Good pinch strength, grip strength. Toe movements  were strong . The patient is very flat feet and smallish musculature for the hip quadriceps, hamstrings and gastrocnemius muscle  Sensory:  Fine touch, pinprick and vibration were tested in all extremities.  Proprioception tested in the upper extremities was normal.  Coordination: Rapid alternating movements in the fingers/hands was normal.  Finger-to-nose maneuver without evidence of ataxia, dysmetria and only mild  Tremor. Her hand will suddenly jerk,change in handwriting towards smaller and smaller script. .   Gait and station: Patient walks without assistive device and is able unassisted to climb up to the exam table. Strength within normal limits.  Stance is stable and wide based. Her gait shows a tendency to sway, but turning is stable/ Tandem gait is fragmented.  Turns with  3  Steps. Romberg testing is positive, patient can compensate for push from the back but not so easily for push from the chest, revealing a tendency of retropulsion.  Deep tendon reflexes: in the  upper and lower extremities are symmetric and intact. Babinski maneuver response is downgoing.  The patient was advised of the nature of the disorder , the treatment options and risks for general a health and wellness arising from conditions related to ataxia.  I spent more than 45  minutes of face to face time with the patient. Greater than 50% of time was  spent in counseling and coordination of care. We have discussed the diagnosis and differential and I answered the patient's questions.     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 She has a tendency to retropulse. She does not have any associated other signs of cerebellar ataxia as her speech is normal, her eye movements were intact and she has only occasional myoclonic jerks in the upper extremities but not significant dysmetria or ataxia with upper extremity movements. She also has preserved grip strength and pinch strengths but reports a tendency to  Micrographia.  She has all natural teeth, does not use any denture cream, she takes irregularly multivitamins, It is not clear if there is a family history of ataxia but a maternal aunt seem to have suffered from vertigo and disequilibrium.  2) I will refer for physical therapy - the patient is followed at [facility], I would like for her to have a gait and balance evaluation with physical med. I also mentioned [facility] which operates at [facility] as his group treats many fibromyalgia patients. I will not change any of her current medications and I do not think that her disequilibrium is a side effect of such.  3) I would encourage her to take a daily multivitamin, ( prenatal ) , retropulsion work up with PT/ neuro rehab or Dr Riley Kill.  I will order a MRI brain, rule out Schwannoma. Cerebellum atrophy, and brain stem lesions.  Plan:  Treatment plan and additional workup : I will ask the patient to speak with her cousins about  other family memebers that had desequilibrium prob,lems, based on her research, we may send a ataxia panel. .  RV in 8 weeks.   Porfirio Mylar Summer Gamino MD  01/22/2016   CC:  Marden Noble, Md [address] [place], [place] [address]

## 2016-01-22 NOTE — Addendum Note (Signed)
Addended by: Melvyn Novas on: 01/22/2016 02:51 PM   Modules accepted: Orders

## 2016-02-06 ENCOUNTER — Telehealth: Payer: Self-pay | Admitting: Neurology

## 2016-02-06 MED ORDER — ALPRAZOLAM 0.25 MG PO TABS: 0.2500 mg | ORAL_TABLET | Freq: Every day | ORAL | 0 refills | Status: DC | PRN

## 2016-02-06 NOTE — Telephone Encounter (Signed)
She already has Xanax prescribed by another provider, I like her to take 1-2 tab 30 minutes before MRI> if she is out of xanax , I would write for a refill - one time, for 0.25 mg pills. CD

## 2016-02-06 NOTE — Telephone Encounter (Signed)
Pt called said she is very claustrophobic and needs something for MRI on Monday sent to [facility]

## 2016-02-06 NOTE — Telephone Encounter (Signed)
I called pt. I advised her that Dr. Vickey Huger has written her a one time RX for xanax to take 1-2 pills 30 mins prior to MRI. Dr. Vickey Huger gave her 30 tablets but pt may ask for just 2 tablets at the pharmacy. Pt verbalized understanding. [facility]. Received a receipt of confirmation.

## 2016-02-11 ENCOUNTER — Ambulatory Visit
Admission: RE | Admit: 2016-02-11 | Discharge: 2016-02-11 | Disposition: A | Discharge status: Home / Self Care | Payer: Medicare Other | Source: Ambulatory Visit | Attending: Neurology | Admitting: Neurology

## 2016-02-11 ENCOUNTER — Encounter: Payer: Self-pay | Admitting: Radiology

## 2016-02-11 DIAGNOSIS — H9313 Tinnitus, bilateral: Secondary | ICD-10-CM

## 2016-02-11 DIAGNOSIS — G40A09 Absence epileptic syndrome, not intractable, without status epilepticus: Secondary | ICD-10-CM

## 2016-02-11 DIAGNOSIS — R296 Repeated falls: Secondary | ICD-10-CM | POA: Diagnosis not present

## 2016-02-11 DIAGNOSIS — H9193 Unspecified hearing loss, bilateral: Secondary | ICD-10-CM

## 2016-02-11 DIAGNOSIS — G40309 Generalized idiopathic epilepsy and epileptic syndromes, not intractable, without status epilepticus: Secondary | ICD-10-CM

## 2016-02-11 DIAGNOSIS — R270 Ataxia, unspecified: Secondary | ICD-10-CM

## 2016-02-11 MED ORDER — GADOBENATE DIMEGLUMINE 529 MG/ML IV SOLN
15.0000 mL | Freq: Once | INTRAVENOUS | Status: AC | PRN
  Administered 2016-02-11: 15 mL via INTRAVENOUS

## 2016-02-13 ENCOUNTER — Telehealth: Payer: Self-pay

## 2016-02-13 DIAGNOSIS — R270 Ataxia, unspecified: Secondary | ICD-10-CM

## 2016-02-13 NOTE — Telephone Encounter (Signed)
I called pt to discuss MRI results. No answer, left a message asking her to call me back. 

## 2016-02-13 NOTE — Telephone Encounter (Signed)
-----   Message from Melvyn Novas, MD sent at 02/13/2016  1:05 PM EST ----- Chronic white matter lesions- no acute - chronic and extensive sinusitis. Dr Epimenio Foot mentioned that demyelination may be present, chronic MS can not be ruled out. CD

## 2016-02-14 NOTE — Telephone Encounter (Signed)
Pt returned Rn's call °

## 2016-02-14 NOTE — Telephone Encounter (Signed)
I called, pt, spent greater than 10 minutes on the phone with her. I advised her that her MRI showed chronic white matter lesions, chronic sinusitis, and that the interpreting physician mentioned that demyelination may be present and that chronic MS cannot be ruled out.  I explained in great depth what "demyelination" means and about what MS is.  Pt has a multitude of questions about where to go from here, why are we not sure that demyelination is present, how to treat MS, etc. Pt is very concerned. Her appt was moved to 03/05/16 per pt request.  Pt will be much less anxious about these results if Dr. Vickey Huger would call her at [phone].

## 2016-02-15 NOTE — Telephone Encounter (Signed)
I spoke to Mrs. Kepple today, and advised her that with her symptoms of gait ataxia and double vision  that could well represent MS I would like for her to undergo a spinal tap to measure oligoclonal bands. I would like to schedule such a test at [facility] imaging on February 12, on Monday which would make it easier to respond to any postintervention headaches etc. the patient's husband can bring her and pick her up. I answered several questions about MS, and that there are diseases that can mimic MS. At this time a diagnosis has not been made but we are working her up to obtain a firm diagnosis. She signaled understanding and he will keep a follow-up visit in late February as is.  Please arrange for a spinal tap under fluoroscopy at [facility] imaging for February 12, labs to be sent for cells and differential, protein and glucose, oligoclonal bands,  Thank You.

## 2016-02-18 NOTE — Telephone Encounter (Signed)
I have pended this order. Please review the order, make sure that it is correct, and then sign it.

## 2016-02-21 ENCOUNTER — Ambulatory Visit
Admission: RE | Admit: 2016-02-21 | Discharge: 2016-02-21 | Disposition: A | Discharge status: Home / Self Care | Payer: Medicare Other | Source: Ambulatory Visit | Attending: Neurology | Admitting: Neurology

## 2016-02-21 DIAGNOSIS — R270 Ataxia, unspecified: Secondary | ICD-10-CM

## 2016-02-21 LAB — PROTEIN, CSF: TOTAL PROTEIN, CSF: 37 mg/dL (ref 15–60)

## 2016-02-21 LAB — CSF CELL COUNT WITH DIFFERENTIAL
RBC Count, CSF: 45 cells/uL — ABNORMAL HIGH (ref 0–10)
WBC, CSF: 0 cells/uL (ref 0–5)

## 2016-02-21 LAB — GLUCOSE, CSF: GLUCOSE CSF: 55 mg/dL (ref 43–76)

## 2016-02-21 NOTE — Discharge Instructions (Signed)

## 2016-02-21 NOTE — Progress Notes (Signed)
One SST tube of blood drawn from left AC space for LP labs; site unremarkable.  jkl 

## 2016-02-21 NOTE — Telephone Encounter (Signed)
Patient is scheduled for her Lumbar Puncture 02/21/2016. Patient  Was fine with time and date per [place] at [facility] .

## 2016-02-25 LAB — CSF CULTURE
GRAM STAIN: NONE SEEN
ORGANISM ID, BACTERIA: NO GROWTH

## 2016-02-25 LAB — CSF CULTURE W GRAM STAIN: Gram Stain: NONE SEEN

## 2016-02-26 ENCOUNTER — Telehealth: Payer: Self-pay

## 2016-02-26 NOTE — Telephone Encounter (Signed)
Noreene Larsson with [facility] called to advise that when LP was done they collected the CSF and serum. But accidentally left the serum in the centrifuge so it was never tested.  They offer 2 options: 1. Bring back patient to collect the serum.  Or 2. Test the CSF against the serum control Noreene Larsson advises the lab confirms this testing will be accurate).  How would you like to proceed?   I will call Noreene Larsson back at [phone]

## 2016-02-26 NOTE — Telephone Encounter (Signed)
I spoke to another female in the office that took a message for Floyd. I gave recommendation below.

## 2016-02-26 NOTE — Telephone Encounter (Signed)
Call patient back to get serum. CD

## 2016-02-26 NOTE — Telephone Encounter (Signed)
Summer Holloway called back to clarify some miscommunication this morning.   The CSF and serum have to be drawn within 8 hours of each other. If you want the serum redrawn, they will have to draw more CSF.  If they can run it against the serum control, no extra CSF needed. How would you like to proceed?   I need to call Kim back with reply.

## 2016-02-26 NOTE — Telephone Encounter (Signed)
I called Kim back. Per Dr. Vickey Huger, ok to run against serum control. She voiced understanding.

## 2016-02-28 ENCOUNTER — Telehealth: Payer: Self-pay

## 2016-02-28 NOTE — Telephone Encounter (Signed)
-----   Message from Melvyn Novas, MD sent at 02/27/2016  4:19 PM EST ----- I am still waiting for oligoclonals.

## 2016-02-28 NOTE — Telephone Encounter (Signed)
-----   Message from Melvyn Novas, MD sent at 02/21/2016  4:16 PM EST ----- Normal opening pressure, clear CSF, labs pending.

## 2016-02-28 NOTE — Telephone Encounter (Signed)
I spoke to patient and she is aware of the results that we do have. I advised her that we will call back when we have further.

## 2016-02-29 LAB — OLIGOCLONAL BANDS, CSF + SERM

## 2016-03-05 ENCOUNTER — Ambulatory Visit (INDEPENDENT_AMBULATORY_CARE_PROVIDER_SITE_OTHER): Payer: Medicare Other | Admitting: Neurology

## 2016-03-05 ENCOUNTER — Encounter: Payer: Self-pay | Admitting: Neurology

## 2016-03-05 VITALS — BP 122/70 | HR 78 | Resp 16 | Ht 67.0 in | Wt 176.0 lb

## 2016-03-05 DIAGNOSIS — G35 Multiple sclerosis: Secondary | ICD-10-CM | POA: Diagnosis not present

## 2016-03-05 MED ORDER — DIMETHYL FUMARATE 120 & 240 MG PO MISC: 120.0000 mg | Freq: Two times a day (BID) | ORAL | 2 refills | Status: DC

## 2016-03-05 NOTE — Progress Notes (Signed)
Provider:  Melvyn Novas, M D  Referring Provider: Marden Noble, MD Primary Care Physician:  Pearla Dubonnet, MD  Chief Complaint  Patient presents with  . Follow-up    Rm 11. Patient is here to discuss all of her test results     HPI:  CONLEY PAWLING is a 66 y.o. female , seen here as a referral/ revisit  from Dr. Kevan Ny for evaluation of dysequilibrium.   Chief complaint according to patient : "about 2 years of unsteadiness" -  [name] is seen here today in the presence of her husband and was referred her primary care physician Dr. Kevan Ny for evaluation of disequilibrium, unsteady gait, possible ataxia. She carries a diagnosis of weight gain, hypertension, she also has been followed by Dr. Kevan Ny for fibromyalgia, insomnia and has been treated for depression. She has been taking Lyrica and amitriptyline.  The chronic disequilibrium begun according to Dr. Kevan Ny notes about  5 years ago " unsteady  " while  the patient walks or is in motion. Sometimes she has have to hold onto her husband's arm to steady her gait. She feels as it is being pulled into a different direction, cannot walk a straight line. She jokingly reports having no chance to pass a drunk driving test. She is only experiencing this pulling when in motion and not at rest. She does not describe vertigo. When anxious she is much more off balance!  She is pleasant and conversant, reports that she is afraid of falling. She has not been evaluated by ENT. She does have hearing loss and a chronic tinnitus  ( a constant high pitches sound ) for 35 years.  She had no MRI of the brain and has no family history of schwannoma. Maternal aunt clarabelle had ataxia, vertigo and auditory hallucinations. Mother had anxiety.   Social history:  Married, non smoker, non ETOH, use, caffeine , none. Retired, Comptroller.   Interval history from 03/05/2016, I have pleasure of seeing Mr. and Mrs. Kassim today to meet them and discuss the  results of her recent MRI which was abnormal and her spinal tap results, cerebral spinal fluid have been sent for oligoclonal bands, cells and differential, glucose and protein.   Review of Systems: Out of a complete 14 system review, the patient complains of only the following symptoms, and all other reviewed systems are negative.  Weight gain, fatigue, palpitations, hearing loss, tinnitus, sometimes spinning sensation but not when not in motion. Blurred vision, snoring, urinary urgency, increased thirst, flushing, joint pain and aching muscles runny nose, depression anxiety, death interest in activities headaches confusion at times memory loss or amnesia, snoring. Hypertension high cholesterol depression anxiety and fibromyalgia ovarian cyst and hysterectomy.  Social History   Social History  . Marital status: Married    Spouse name: N/A  . Number of children: N/A  . Years of education: N/A   Occupational History  . Not on file.   Social History Main Topics  . Smoking status: Never Smoker  . Smokeless tobacco: Never Used  . Alcohol use No  . Drug use: Unknown  . Sexual activity: Not on file   Other Topics Concern  . Not on file   Social History Narrative  . No narrative on file    Family History  Problem Relation Age of Onset  . Fibromyalgia Mother   . Heart attack Father   . CAD Father     Past Medical History:  Diagnosis Date  .  Anxiety   . Bursitis of left hip   . Depression   . Esophageal reflux   . Fibromyalgia   . Hearing loss   . Hyperlipemia   . Hyperlipidemia   . Hypertension   . Osteoarthritis   . PVC (premature ventricular contraction)   . Sciatica   . Tinnitus     Past Surgical History:  Procedure Laterality Date  . CATARACT EXTRACTION, BILATERAL    . PARTIAL HYSTERECTOMY      Current Outpatient Prescriptions  Medication Sig Dispense Refill  . ALPRAZolam (XANAX) 0.25 MG tablet Take 1 tablet (0.25 mg total) by mouth daily as needed for  anxiety. 1-2 tablets at onset of panic. 30 tablet 0  . amitriptyline (ELAVIL) 50 MG tablet Take 25 mg by mouth at bedtime.    Marland Kitchen amLODipine (NORVASC) 5 MG tablet Take 5 mg by mouth daily.    Marland Kitchen buPROPion (WELLBUTRIN XL) 150 MG 24 hr tablet Take 150 mg by mouth daily.    . DULoxetine (CYMBALTA) 60 MG capsule Take 60 mg by mouth daily.    Marland Kitchen escitalopram (LEXAPRO) 10 MG tablet Take 10 mg by mouth 2 (two) times daily.    Marland Kitchen estradiol (ESTRACE) 1 MG tablet Take 0.5 mg by mouth daily.   3  . FLUoxetine (PROZAC) 40 MG capsule Take 40 mg by mouth daily.    Marland Kitchen HYDROcodone-acetaminophen (NORCO) 10-325 MG tablet Take 1 tablet by mouth every 12 (twelve) hours as needed.    Marland Kitchen levothyroxine (SYNTHROID, LEVOTHROID) 50 MCG tablet Take 50 mcg by mouth daily before breakfast.    . pantoprazole (PROTONIX) 40 MG tablet Take 40 mg by mouth daily.    . pregabalin (LYRICA) 100 MG capsule Take 100 mg by mouth. 2 capsules in am and 1 capsule in pm     . propranolol (INDERAL) 40 MG tablet Take 20 mg by mouth 2 (two) times daily.    . rosuvastatin (CRESTOR) 5 MG tablet Take 5 mg by mouth daily.     No current facility-administered medications for this visit.     Allergies as of 03/05/2016  . (No Known Allergies)   [facility] [address] [place], [place] [address] [phone]  NEUROIMAGING REPORT    STUDY DATE: 02/10/2016 PATIENT NAME: Summer Holloway DOB: 18-Feb-1950 MRN: [id number]  EXAM: MRI Brain with and without contrast  ORDERING CLINICIAN: Melvyn Novas M.D. CLINICAL HISTORY: 66 year old woman with ataxia, hearing loss and frequent falls COMPARISON FILMS: None  TECHNIQUE:MRI of the brain with and without contrast was obtained utilizing 5 mm axial slices with T1, T2, T2 flair, SWI and diffusion weighted views.  T1 sagittal, T2 coronal and postcontrast views in the axial and coronal plane were obtained. CONTRAST: 15 ml [facility] IMAGING SITE: [facility],  [address] [place].  FINDINGS: On sagittal images, the spinal cord is imaged caudally to C3 and is normal in caliber.   The contents of the posterior fossa are of normal size and position.   The pituitary gland and optic chiasm appear normal.    Brain volume appears normal.   The ventricles are normal in size and without distortion.  There are no abnormal extra-axial collections of fluid.    There are T2/FLAIR hyperintense foci in the right middle cerebellar peduncle and adjacent cerebellum, left posterior pons and in the periventricular and deep white matter of both hemispheres. None of the foci enhanced or appeared to be acute.  The deep gray matter appears normal.  Diffusion weighted images are normal.  Susceptibility weighted images are normal.    The orbits appear normal.   The VIIth/VIIIth nerve complex appears normal.  The mastoid air cells appear normal.  There are chronic inflammatory changes of passive finding the right maxillary sinus, many of the ethmoid air cells and the right frontal sinus. The left frontal recess and left frontal sinus show mucoperiosteal thickening.  Flow voids are identified within the major intracerebral arteries.     After the infusion of contrast material, a normal enhancement pattern is noted.   IMPRESSION:  This MRI of the brain with and without contrast shows the following: 1.    There are T2/FLAIR hyperintense foci in the left posterior pons, right middle cerebellar peduncle and in the periventricular and deep white matter of the hemispheres. This pattern is consistent with chronic demyelination as could be seen with multiple sclerosis but could also be seen with chronic microvascular ischemic changes.     None of the foci appeared to be acute. Further clinical correlation is suggested. 2.    Chronic left maxillary, bilateral ethmoid and bilateral frontal sinusitis. 3.    There is a normal enhancement pattern.     INTERPRETING PHYSICIAN:    Richard A. Sater, MD, PhD Certified in  Neuroimaging by American Society of Neuroimaging   CSF Oligoclonal Bands REPORT   Comments: Bands noted          Reference Range: No bands  The patient's CSF contains (>5) well defined gamma  restriction bands. These bands indicate abnormal  synthesis of gammaglobulins in the central nervous  system. However, patient's corresponding serum was not  submitted and we are unable to define whether these  gammaglobulins are of systemic or intracerebral origin.  Oligoclonal bands are present in the CSF of more than  85% of patients with clinically definite multiple  sclerosis (MS). To distinguish between oligoclonal  bands in the CSF due to a peripheral gammopathy and  oligoclonal bands due to local production in the CNS,  serum and CSF should be tested simultaneously.  Oligoclonal bands can however be observed in a variety  of other diseases, e.g., subacute sclerosing panen-  cephalitis, inflammatory polyneuropathy, CNS lupus,  and brain tumors and infarctions. The clinical  significance of a numerical band count, determined  by isoelectric focusing, has not been definitively  defined. The data should be interpreted in conjunction  with all pertinent clinical and laboratory data for  this patient.   Resulting Agency [facility]  Narrative      Vitals: BP 122/70   Pulse 78   Resp 16   Ht 5\' 7"  (1.702 m)   Wt 176 lb (79.8 kg)   BMI 27.57 kg/m  Last Weight:  Wt Readings from Last 1 Encounters:  03/05/16 176 lb (79.8 kg)   JXB:JYNW mass index is 27.57 kg/m.     Last Height:   Ht Readings from Last 1 Encounters:  03/05/16 5\' 7"  (1.702 m)    Physical exam:  General: The patient is awake, alert and appears not in acute distress. The patient is well groomed. Head: Normocephalic, atraumatic. Neck is supple. Mallampati 2  neck circumference: 14.5 . Nasal airflow patent , Cardiovascular:  Regular rate and rhythm , without  murmurs or  carotid bruit, and without distended neck veins. Respiratory: Lungs are clear to auscultation. Skin:  Without evidence of edema, or rash Trunk: BMI is 28,  The patient's posture is erect, hyperlordosis   Neurologic exam : The  patient is awake and alert, oriented to place and time.     Attention span & concentration ability appears normal.  Speech is fluent,  without dysarthria, dysphonia or aphasia.  Mood and affect are appropriate.  Cranial nerves: Pupils are equal and briskly reactive to light. Funduscopic exam without evidence of pallor or edema. No change in colour vision," red is red " Loss of acuity, diplopia, she reports horizontal diplopia.  Extraocular movements  in vertical and horizontal planes intact and without nystagmus. Visual fields by finger perimetry are intact. Hearing loss, evident in crowds and on the phone.  Facial sensation intact to fine touch.  Facial motor strength is symmetric and tongue and uvula move midline. Shoulder shrug was symmetrical.   Motor exam:   Normal tone, muscle bulk and symmetric strength in all extremities. Good pinch strength, grip strength. Toe movements were strong . The patient is very flat feet and smallish musculature for the hip quadriceps, hamstrings and gastrocnemius muscle  Sensory:  Fine touch, pinprick and vibration were tested in all extremities.  Proprioception tested in the upper extremities was normal.  Coordination: Rapid alternating movements in the fingers/hands was normal.  Finger-to-nose maneuver without evidence of ataxia, dysmetria and only mild  Tremor. Her hand will suddenly jerk,change in handwriting towards smaller and smaller script. .   Gait and station: Patient walks without assistive device and is able unassisted to climb up to the exam table. Strength within normal limits.  Stance is stable and wide based. Her gait shows a tendency to sway, but turning is stable/ Tandem gait is fragmented.  Turns with  3   Steps. Romberg testing is positive, patient can compensate for push from the back but not so easily for push from the chest, revealing a tendency of retropulsion.  Deep tendon reflexes: in the  upper and lower extremities are symmetric and intact. Babinski maneuver response is downgoing.  [name] has a mild ataxia- a tendency to sway left and right when she walks she also needs sometimes to take an extra compensatory step is turns. She has a tendency to retropulse. She does not have any associated other signs of cerebellar ataxia as her speech is normal, her eye movements were intact and she has only occasional myoclonic jerks in the upper extremities but not significant dysmetria or ataxia with upper extremity movements. She also has preserved grip strength and pinch strengths but reports a tendency for micrographia.   The patient was advised of my work up results and we viewed the mRI study togther, the oligoclonal bands are more than 5 and well defined, the MRI highly suspicious for a demyelinating disorder, my diagnosis is multiple sclerosis with late onset. 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  I spent more than 25  minutes of face to face time with the patient and her spouse. Greater than 50% of time was spent in counseling and coordination of care. We have discussed the diagnosis and differential and I answered the patient's questions.     Assessment:  After physical and neurologic examination, review of laboratory studies,  Personal review of imaging studies, reports of other /same  Imaging studies ,  Results of polysomnography/ neurophysiology testing and pre-existing records as far as provided in visit., my assessment is   1)  MS diagnosis most likely  explanation for clinical symptoms, MRI and CSF results.     Plan:  Treatment plan and additional workup :  Start immuno-modification.  Given that the patient is at this point treatment nave, I would not want her to use an injectable therapy but an oral therapy. I prefer 2.  Gilenya is out of the picture due to the patient's history of heart disease and slower heart rates. Aubagio is less GI tolerated.   I will order a starter Tacfidera.  I will follow the patient is 8 weeks and 16 weeks on the medication.    Porfirio Mylar Kiowa Peifer MD  03/05/2016   CC: Marden Noble, Md [address] [place], [place] [address]

## 2016-03-05 NOTE — Patient Instructions (Signed)
Multiple Sclerosis °Multiple sclerosis (MS) is a disease of the central nervous system. It leads to the loss of the insulating covering of the nerves (myelin sheath) of your brain. When this happens, brain signals do not get sent properly or may not get sent at all. The age of onset of MS varies. °What are the causes? °The cause of MS is unknown. However, it is more common in the [place] than in the southern [place]. °What increases the risk? °There is a higher number of women with MS than men. MS is not an illness that is passed down to you from your family members (inherited). However, your risk of MS is higher if you have a relative with MS. °What are the signs or symptoms? °The symptoms of MS occur in episodes or attacks. These attacks may last weeks to months. There may be long periods of almost no symptoms between attacks. The symptoms of MS vary. This is because of the many different ways it affects the central nervous system. The main symptoms of MS include: °· Vision problems and eye pain. °· Numbness. °· Weakness. °· Inability to move your arms, hands, feet, or legs (paralysis). °· Balance problems. °· Tremors. °How is this diagnosed? °Your health care provider can diagnose MS with the help of imaging exams and lab tests. These may include specialized X-ray exams and spinal fluid tests. The best imaging exam to confirm a diagnosis of MS is an MRI. °How is this treated? °There is no known cure for MS, but there are medicines that can decrease the number and frequency of attacks. Steroids are often used for short-term relief. Physical and occupational therapy may also help. There are also many new alternative or complementary treatments available to help control the symptoms of MS. Ask your health care provider if any of these other options are right for you. °Follow these instructions at home: °· Take medicines as directed by your health care provider. °· Exercise as directed by your  health care provider. °Contact a health care provider if: °You begin to feel depressed. °Get help right away if: °· You develop paralysis. °· You have problems with bladder, bowel, or sexual function. °· You develop mental changes, such as forgetfulness or mood swings. °· You have a period of uncontrolled movements (seizure). °This information is not intended to replace advice given to you by your health care provider. Make sure you discuss any questions you have with your health care provider. °Document Released: 12/28/1999 Document Revised: 06/07/2015 Document Reviewed: 09/06/2012 °Elsevier Interactive Patient Education © 2017 Elsevier Inc. ° °

## 2016-03-06 ENCOUNTER — Other Ambulatory Visit: Payer: Self-pay

## 2016-03-06 DIAGNOSIS — G35 Multiple sclerosis: Secondary | ICD-10-CM

## 2016-03-06 LAB — CBC WITH DIFFERENTIAL/PLATELET
BASOS ABS: 0 10*3/uL (ref 0.0–0.2)
Basos: 1 %
EOS (ABSOLUTE): 0.1 10*3/uL (ref 0.0–0.4)
Eos: 1 %
Hematocrit: 37.3 % (ref 34.0–46.6)
Hemoglobin: 12.9 g/dL (ref 11.1–15.9)
Immature Grans (Abs): 0 10*3/uL (ref 0.0–0.1)
Immature Granulocytes: 0 %
LYMPHS ABS: 1.3 10*3/uL (ref 0.7–3.1)
LYMPHS: 25 %
MCH: 31.5 pg (ref 26.6–33.0)
MCHC: 34.6 g/dL (ref 31.5–35.7)
MCV: 91 fL (ref 79–97)
Monocytes Absolute: 0.5 10*3/uL (ref 0.1–0.9)
Monocytes: 9 %
NEUTROS ABS: 3.3 10*3/uL (ref 1.4–7.0)
Neutrophils: 64 %
PLATELETS: 379 10*3/uL (ref 150–379)
RBC: 4.09 x10E6/uL (ref 3.77–5.28)
RDW: 14.2 % (ref 12.3–15.4)
WBC: 5.1 10*3/uL (ref 3.4–10.8)

## 2016-03-06 LAB — COMPREHENSIVE METABOLIC PANEL
A/G RATIO: 1.5 (ref 1.2–2.2)
ALT: 13 IU/L (ref 0–32)
AST: 24 IU/L (ref 0–40)
Albumin: 4 g/dL (ref 3.6–4.8)
Alkaline Phosphatase: 148 IU/L — ABNORMAL HIGH (ref 39–117)
BUN / CREAT RATIO: 17 (ref 12–28)
BUN: 15 mg/dL (ref 8–27)
CHLORIDE: 99 mmol/L (ref 96–106)
CO2: 27 mmol/L (ref 18–29)
Calcium: 9.1 mg/dL (ref 8.7–10.3)
Creatinine, Ser: 0.86 mg/dL (ref 0.57–1.00)
GFR calc non Af Amer: 71 (ref >59)
GFR, EST AFRICAN AMERICAN: 81 (ref >59)
Globulin, Total: 2.7 (ref 1.5–4.5)
Glucose: 105 mg/dL — ABNORMAL HIGH (ref 65–99)
POTASSIUM: 4.5 mmol/L (ref 3.5–5.2)
Sodium: 140 mmol/L (ref 134–144)
TOTAL PROTEIN: 6.7 g/dL (ref 6.0–8.5)

## 2016-03-06 MED ORDER — DIMETHYL FUMARATE 120 & 240 MG PO MISC: ORAL | 0 refills | Status: DC

## 2016-03-12 ENCOUNTER — Telehealth: Payer: Self-pay

## 2016-03-12 NOTE — Telephone Encounter (Signed)
I received a letter today from [employer] stating that this patient has been approved for [employer] for Tecfidera.

## 2016-03-17 NOTE — Telephone Encounter (Signed)
I spoke to pt and she did get a call from [employer] and should receive her first shipment of tecfidera on Wednesday. Pt will call back with further questions or concerns.

## 2016-03-17 NOTE — Telephone Encounter (Signed)
Pt called says [facility] (p) [phone] has not rec'd RX for tecfidera. Please call

## 2016-03-17 NOTE — Telephone Encounter (Signed)
I called [employer], spoke to [place]. He advised me that actually [facility] does have the RX but they have not reached out to the pt to set up a shipment yet. However, Antonio will call the pt right now.

## 2016-03-18 NOTE — Telephone Encounter (Addendum)
Received notification from [employer] that the pt's tecfidera has been shipped. Should call [phone] for further refill concerns.

## 2016-03-25 ENCOUNTER — Ambulatory Visit: Payer: Medicare Other | Admitting: Neurology

## 2016-04-17 ENCOUNTER — Other Ambulatory Visit: Payer: Self-pay | Admitting: Internal Medicine

## 2016-04-17 DIAGNOSIS — Z1231 Encounter for screening mammogram for malignant neoplasm of breast: Secondary | ICD-10-CM

## 2016-04-23 ENCOUNTER — Ambulatory Visit: Payer: Medicare Other | Admitting: Neurology

## 2016-04-28 ENCOUNTER — Ambulatory Visit: Payer: Medicare Other | Admitting: Adult Health

## 2016-04-28 ENCOUNTER — Telehealth: Payer: Self-pay | Admitting: *Deleted

## 2016-04-28 NOTE — Telephone Encounter (Signed)
LVM informing patient her follow up was cancelled today due to power outage at office. Left office number and advised she call today after 12 noon or tomorrow to reschedule her FU with Dolores Hoose, NP.

## 2016-05-07 ENCOUNTER — Ambulatory Visit: Payer: Medicare Other

## 2016-05-21 ENCOUNTER — Ambulatory Visit
Admission: RE | Admit: 2016-05-21 | Discharge: 2016-05-21 | Disposition: A | Discharge status: Home / Self Care | Payer: Medicare Other | Source: Ambulatory Visit | Attending: Internal Medicine | Admitting: Internal Medicine

## 2016-05-21 DIAGNOSIS — Z1231 Encounter for screening mammogram for malignant neoplasm of breast: Secondary | ICD-10-CM

## 2016-05-22 ENCOUNTER — Other Ambulatory Visit: Payer: Self-pay | Admitting: Internal Medicine

## 2016-05-22 DIAGNOSIS — R928 Other abnormal and inconclusive findings on diagnostic imaging of breast: Secondary | ICD-10-CM

## 2016-05-27 ENCOUNTER — Other Ambulatory Visit: Payer: Self-pay | Admitting: Internal Medicine

## 2016-05-27 ENCOUNTER — Ambulatory Visit
Admission: RE | Admit: 2016-05-27 | Discharge: 2016-05-27 | Disposition: A | Discharge status: Home / Self Care | Payer: Medicare Other | Source: Ambulatory Visit | Attending: Internal Medicine | Admitting: Internal Medicine

## 2016-05-27 ENCOUNTER — Ambulatory Visit (INDEPENDENT_AMBULATORY_CARE_PROVIDER_SITE_OTHER): Payer: Medicare Other | Admitting: Neurology

## 2016-05-27 ENCOUNTER — Encounter: Payer: Self-pay | Admitting: Neurology

## 2016-05-27 VITALS — BP 136/83 | HR 79 | Ht 67.0 in | Wt 175.0 lb

## 2016-05-27 DIAGNOSIS — R928 Other abnormal and inconclusive findings on diagnostic imaging of breast: Secondary | ICD-10-CM

## 2016-05-27 DIAGNOSIS — R921 Mammographic calcification found on diagnostic imaging of breast: Secondary | ICD-10-CM

## 2016-05-27 DIAGNOSIS — H5121 Internuclear ophthalmoplegia, right eye: Secondary | ICD-10-CM | POA: Diagnosis not present

## 2016-05-27 DIAGNOSIS — G35 Multiple sclerosis: Secondary | ICD-10-CM | POA: Diagnosis not present

## 2016-05-27 NOTE — Progress Notes (Signed)
Provider:  Melvyn Novas, M D  Referring Provider: Marden Noble, MD Primary Care Physician:  Marden Noble, MD  Chief Complaint  Patient presents with  . Follow-up    pt taking tecfidera 240mg  BID and tolerating well    HPI:  Summer Holloway is a 66 y.o. female , seen here as a referral/ revisit  from Dr. Kevan Ny for evaluation of dysequilibrium.   Chief complaint according to patient : "about 2 years of unsteadiness" -  Summer Holloway is seen here today in the presence of her husband and was referred her primary care physician Dr. Kevan Ny for evaluation of disequilibrium, unsteady gait, possible ataxia. She carries a diagnosis of weight gain, hypertension, she also has been followed by Dr. Kevan Ny for fibromyalgia, insomnia and has been treated for depression. She has been taking Lyrica and amitriptyline.  The chronic disequilibrium begun according to Dr. Kevan Ny notes about  5 years ago " unsteady  " while  the patient walks or is in motion. Sometimes she has have to hold onto her husband's arm to steady her gait. She feels as it is being pulled into a different direction, cannot walk a straight line. She jokingly reports having no chance to pass a drunk driving test. She is only experiencing this pulling when in motion and not at rest. She does not describe vertigo. When anxious she is much more off balance!  She is pleasant and conversant, reports that she is afraid of falling. She has not been evaluated by ENT. She does have hearing loss and a chronic tinnitus  ( a constant high pitches sound ) for 35 years.  She had no MRI of the brain and has no family history of schwannoma.Maternal aunt clarabelle had ataxia, vertigo and auditory hallucinations. Mother had anxiety.  Social history:  Married, non smoker, non ETOH, use, caffeine , none. Retired, Comptroller.   Interval history from 03/05/2016, I have pleasure of seeing Mr. and Mrs. Iovino today to meet them and discuss the results of her  recent MRI which was abnormal and her spinal tap results, cerebral spinal fluid have been sent for oligoclonal bands, cells and differential, glucose and protein.  Patient seen and a routine revisit today is 05/27/2016. Summer Holloway has tolerated Tecfidera very well - improved tremor and less balance problems subjectively. Today CBC and CMET , next visit with NP after another 6 -8 weeks.    Review of Systems: Out of a complete 14 system review, the patient complains of only the following symptoms, and all other reviewed systems are negative.  Weight gain, fatigue, palpitations, hearing loss, tinnitus, sometimes spinning sensation but not when not in motion. Blurred vision, snoring, urinary urgency, increased thirst, flushing, joint pain and aching muscles runny nose, depression anxiety, death interest in activities headaches confusion at times memory loss or amnesia, snoring. Hypertension high cholesterol depression anxiety and fibromyalgia ovarian cyst and hysterectomy.  Social History   Social History  . Marital status: Married    Spouse name: N/A  . Number of children: N/A  . Years of education: N/A   Occupational History  . Not on file.   Social History Main Topics  . Smoking status: Never Smoker  . Smokeless tobacco: Never Used  . Alcohol use No  . Drug use: Unknown  . Sexual activity: Not on file   Other Topics Concern  . Not on file   Social History Narrative  . No narrative on file    Family  History  Problem Relation Age of Onset  . Fibromyalgia Mother   . Heart attack Father   . CAD Father     Past Medical History:  Diagnosis Date  . Anxiety   . Bursitis of left hip   . Depression   . Esophageal reflux   . Fibromyalgia   . Hearing loss   . Hyperlipemia   . Hyperlipidemia   . Hypertension   . Osteoarthritis   . PVC (premature ventricular contraction)   . Sciatica   . Tinnitus     Past Surgical History:  Procedure Laterality Date  . BREAST BIOPSY   05/24/2002  . CATARACT EXTRACTION, BILATERAL    . PARTIAL HYSTERECTOMY      Current Outpatient Prescriptions  Medication Sig Dispense Refill  . ALPRAZolam (XANAX) 0.25 MG tablet Take 1 tablet (0.25 mg total) by mouth daily as needed for anxiety. 1-2 tablets at onset of panic. 30 tablet 0  . amitriptyline (ELAVIL) 50 MG tablet Take 25 mg by mouth at bedtime.    Marland Kitchen amLODipine (NORVASC) 5 MG tablet Take 5 mg by mouth daily.    Marland Kitchen buPROPion (WELLBUTRIN XL) 150 MG 24 hr tablet Take 150 mg by mouth daily.    . Dimethyl Fumarate (TECFIDERA) 120 & 240 MG MISC Titration starter: 120mg  PO BID x7 days. 240mg  PO BID thereafter. Take after a meal. 180 each 0  . DULoxetine (CYMBALTA) 60 MG capsule Take 60 mg by mouth daily.    Marland Kitchen estradiol (ESTRACE) 1 MG tablet Take 0.5 mg by mouth daily.   3  . FLUoxetine (PROZAC) 40 MG capsule Take 40 mg by mouth daily.    Marland Kitchen HYDROcodone-acetaminophen (NORCO) 10-325 MG tablet Take 1 tablet by mouth every 12 (twelve) hours as needed.    Marland Kitchen levothyroxine (SYNTHROID, LEVOTHROID) 50 MCG tablet Take 50 mcg by mouth daily before breakfast.    . pantoprazole (PROTONIX) 40 MG tablet Take 40 mg by mouth daily.    . pregabalin (LYRICA) 100 MG capsule Take 100 mg by mouth. 2 capsules in am and 1 capsule in pm     . propranolol (INDERAL) 40 MG tablet Take 20 mg by mouth 2 (two) times daily.    . rosuvastatin (CRESTOR) 5 MG tablet Take 5 mg by mouth daily.    Marland Kitchen escitalopram (LEXAPRO) 10 MG tablet Take 10 mg by mouth 2 (two) times daily.     No current facility-administered medications for this visit.     Allergies as of 05/27/2016  . (No Known Allergies)   [facility] [address], [address] [place], [place] [address] [phone]  NEUROIMAGING REPORT    STUDY DATE: 02/10/2016 PATIENT NAME: Summer Holloway DOB: 03/11/1950 MRN: [id number]  EXAM: MRI Brain with and without contrast  ORDERING CLINICIAN: Melvyn Novas M.D. CLINICAL HISTORY:  66 year old woman with ataxia, hearing loss and frequent falls COMPARISON FILMS: None  TECHNIQUE:MRI of the brain with and without contrast was obtained utilizing 5 mm axial slices with T1, T2, T2 flair, SWI and diffusion weighted views.  T1 sagittal, T2 coronal and postcontrast views in the axial and coronal plane were obtained. CONTRAST: 15 ml [facility] IMAGING SITE: [employer] [facility], [address] [place].  FINDINGS: On sagittal images, the spinal cord is imaged caudally to C3 and is normal in caliber.   The contents of the posterior fossa are of normal size and position.   The pituitary gland and optic chiasm appear normal.    Brain volume appears normal.  The ventricles are normal in size and without distortion.  There are no abnormal extra-axial collections of fluid.    There are T2/FLAIR hyperintense foci in the right middle cerebellar peduncle and adjacent cerebellum, left posterior pons and in the periventricular and deep white matter of both hemispheres. None of the foci enhanced or appeared to be acute.  The deep gray matter appears normal.   Diffusion weighted images are normal.  Susceptibility weighted images are normal.    The orbits appear normal.   The VIIth/VIIIth nerve complex appears normal.  The mastoid air cells appear normal.  There are chronic inflammatory changes of passive finding the right maxillary sinus, many of the ethmoid air cells and the right frontal sinus. The left frontal recess and left frontal sinus show mucoperiosteal thickening.  Flow voids are identified within the major intracerebral arteries.     After the infusion of contrast material, a normal enhancement pattern is noted.   IMPRESSION:  This MRI of the brain with and without contrast shows the following: 1.    There are T2/FLAIR hyperintense foci in the left posterior pons, right middle cerebellar peduncle and in the periventricular and deep white matter of the hemispheres. This pattern is  consistent with chronic demyelination as could be seen with multiple sclerosis but could also be seen with chronic microvascular ischemic changes.     None of the foci appeared to be acute. Further clinical correlation is suggested. 2.    Chronic left maxillary, bilateral ethmoid and bilateral frontal sinusitis. 3.    There is a normal enhancement pattern.     INTERPRETING PHYSICIAN:  Richard A. Sater, MD, PhD Certified in  Neuroimaging by American Society of Neuroimaging   CSF Oligoclonal Bands REPORT   Comments: Bands noted          Reference Range: No bands  The patient's CSF contains (>5) well defined gamma  restriction bands. These bands indicate abnormal  synthesis of gammaglobulins in the central nervous  system. However, patient's corresponding serum was not  submitted and we are unable to define whether these  gammaglobulins are of systemic or intracerebral origin.  Oligoclonal bands are present in the CSF of more than  85% of patients with clinically definite multiple  sclerosis (MS). To distinguish between oligoclonal  bands in the CSF due to a peripheral gammopathy and  oligoclonal bands due to local production in the CNS,  serum and CSF should be tested simultaneously.  Oligoclonal bands can however be observed in a variety  of other diseases, e.g., subacute sclerosing panen-  cephalitis, inflammatory polyneuropathy, CNS lupus,  and brain tumors and infarctions. The clinical  significance of a numerical band count, determined  by isoelectric focusing, has not been definitively  defined. The data should be interpreted in conjunction  with all pertinent clinical and laboratory data for  this patient.   Resulting Agency [facility]  Narrative      Vitals: BP 136/83   Pulse 79   Ht 5\' 7"  (1.702 m)   Wt 175 lb (79.4 kg)   BMI 27.41 kg/m  Last Weight:  Wt Readings from Last 1 Encounters:  05/27/16 175 lb (79.4 kg)   ZES:PQZR mass index is 27.41  kg/m.     Last Height:   Ht Readings from Last 1 Encounters:  05/27/16 5\' 7"  (1.702 m)    Physical exam:  General: The patient is awake, alert and appears not in acute distress. The patient is well groomed. Head: Normocephalic, atraumatic.  Neck is supple. Mallampati 2  neck circumference: 14.5 . Nasal airflow patent , Skin:  Without evidence of edema, or rash Trunk: BMI is 28,  The patient's posture is erect, she has hyperlordosis   Neurologic exam : The patient is awake and alert, oriented to place and time.     Attention span & concentration ability appears normal.  Speech is fluent,  without dysarthria, dysphonia or aphasia.  Mood and affect are appropriate.  Cranial nerves: Pupils are equal and briskly reactive to light. Funduscopic exam without evidence of pallor or edema. No change in colour vision," red is red " Loss of acuity, diplopia, she reports horizontal diplopia.  Extraocular movements  in vertical and horizontal planes intact and without nystagmus. Visual fields by finger perimetry are intact.Hearing loss, evident in crowds and on the phone.  Facial sensation intact to fine touch. Facial motor strength is symmetric and tongue and uvula move midline. Shoulder shrug was symmetrical.  Motor exam:   Normal tone, muscle bulk and symmetric strength in all extremities. Good pinch strength, grip strength. Toe movements were strong . The patient is very flat feet and smallish musculature for the hip quadriceps, hamstrings and gastrocnemius muscle Sensory:  Fine touch, pinprick and vibration were tested in all extremities. Proprioception tested in the upper extremities was normal. Coordination: Rapid alternating movements in the fingers/hands was normal.  Finger-to-nose maneuver without evidence of ataxia, dysmetria and only mild , low amplitude tremor. Her hand will suddenly jerk,change in handwriting towards smaller and smaller script. .   Gait and station: Patient walks without  assistive device and is able unassisted to climb up to the exam table. Strength within normal limits. Stance is stable and wide based.  Her gait shows a tendency to sway, but turning is stable/Tandem gait remains fragmented. Turns still with 3  Steps.  Romberg testing is positive, patient can compensate for push from the back but not so easily for push from the chest, revealing a tendency of retropulsion.  Deep tendon reflexes: in the upper and lower extremities are symmetric and intact. Babinski maneuver response is downgoing.  New MS Patient on Tacfidera. I spent more than 25  minutes of face to face time with the patient and her spouse. Greater than 50% of time was spent in counseling and coordination of care. We have discussed the diagnosis and differential and I answered the patient's questions.    Assessment:  After physical and neurologic examination, review of laboratory studies,  Personal review of imaging studies, reports of other /same  Imaging studies ,  Results of polysomnography/ neurophysiology testing and pre-existing records as far as provided in visit., my assessment is   1)  MS diagnosis most likely explanation for clinical symptoms, MRI and CSF results. Tacfidera was started.  2) Ataxia 3) Tinnitus.  Plan:  Treatment plan and additional workup :  Tacfidera has been well  tolerated,  No or slow  progression of MS.   CMET and CBC diff  Porfirio Mylar Boluwatife Mutchler MD  05/27/2016   CC: Marden Noble, Md [address] [place], [place] [address]

## 2016-05-27 NOTE — Patient Instructions (Signed)
Multiple Sclerosis °Multiple sclerosis (MS) is a disease of the central nervous system. It leads to the loss of the insulating covering of the nerves (myelin sheath) of your brain. When this happens, brain signals do not get sent properly or may not get sent at all. The age of onset of MS varies. °What are the causes? °The cause of MS is unknown. However, it is more common in the [place] than in the southern [place]. °What increases the risk? °There is a higher number of women with MS than men. MS is not an illness that is passed down to you from your family members (inherited). However, your risk of MS is higher if you have a relative with MS. °What are the signs or symptoms? °The symptoms of MS occur in episodes or attacks. These attacks may last weeks to months. There may be long periods of almost no symptoms between attacks. The symptoms of MS vary. This is because of the many different ways it affects the central nervous system. The main symptoms of MS include: °· Vision problems and eye pain. °· Numbness. °· Weakness. °· Inability to move your arms, hands, feet, or legs (paralysis). °· Balance problems. °· Tremors. °How is this diagnosed? °Your health care provider can diagnose MS with the help of imaging exams and lab tests. These may include specialized X-ray exams and spinal fluid tests. The best imaging exam to confirm a diagnosis of MS is an MRI. °How is this treated? °There is no known cure for MS, but there are medicines that can decrease the number and frequency of attacks. Steroids are often used for short-term relief. Physical and occupational therapy may also help. There are also many new alternative or complementary treatments available to help control the symptoms of MS. Ask your health care provider if any of these other options are right for you. °Follow these instructions at home: °· Take medicines as directed by your health care provider. °· Exercise as directed by your  health care provider. °Contact a health care provider if: °You begin to feel depressed. °Get help right away if: °· You develop paralysis. °· You have problems with bladder, bowel, or sexual function. °· You develop mental changes, such as forgetfulness or mood swings. °· You have a period of uncontrolled movements (seizure). °This information is not intended to replace advice given to you by your health care provider. Make sure you discuss any questions you have with your health care provider. °Document Released: 12/28/1999 Document Revised: 06/07/2015 Document Reviewed: 09/06/2012 °Elsevier Interactive Patient Education © 2017 Elsevier Inc. ° °

## 2016-05-27 NOTE — Addendum Note (Signed)
Addended by: Melvyn Novas on: 05/27/2016 11:25 AM   Modules accepted: Orders

## 2016-05-28 LAB — COMPREHENSIVE METABOLIC PANEL
ALBUMIN: 4.1 g/dL (ref 3.6–4.8)
ALT: 9 IU/L (ref 0–32)
AST: 23 IU/L (ref 0–40)
Albumin/Globulin Ratio: 1.5 (ref 1.2–2.2)
Alkaline Phosphatase: 130 IU/L — ABNORMAL HIGH (ref 39–117)
BUN / CREAT RATIO: 15 (ref 12–28)
BUN: 12 mg/dL (ref 8–27)
Bilirubin Total: <0.2 mg/dL (ref 0.0–1.2)
CALCIUM: 9.3 mg/dL (ref 8.7–10.3)
CO2: 27 mmol/L (ref 18–29)
CREATININE: 0.79 mg/dL (ref 0.57–1.00)
Chloride: 104 mmol/L (ref 96–106)
GFR, EST AFRICAN AMERICAN: 90 mL/min/{1.73_m2} (ref >59)
GFR, EST NON AFRICAN AMERICAN: 78 mL/min/{1.73_m2} (ref >59)
GLUCOSE: 102 mg/dL — AB (ref 65–99)
Globulin, Total: 2.7 g/dL (ref 1.5–4.5)
Potassium: 5 mmol/L (ref 3.5–5.2)
Sodium: 145 mmol/L — ABNORMAL HIGH (ref 134–144)
TOTAL PROTEIN: 6.8 g/dL (ref 6.0–8.5)

## 2016-05-28 LAB — CBC WITH DIFFERENTIAL/PLATELET
BASOS ABS: 0 10*3/uL (ref 0.0–0.2)
BASOS: 1 %
EOS (ABSOLUTE): 0.1 10*3/uL (ref 0.0–0.4)
EOS: 1 %
HEMATOCRIT: 37.6 % (ref 34.0–46.6)
HEMOGLOBIN: 12.7 g/dL (ref 11.1–15.9)
IMMATURE GRANS (ABS): 0 10*3/uL (ref 0.0–0.1)
Immature Granulocytes: 0 %
LYMPHS ABS: 1.2 10*3/uL (ref 0.7–3.1)
LYMPHS: 21 %
MCH: 31.4 pg (ref 26.6–33.0)
MCHC: 33.8 g/dL (ref 31.5–35.7)
MCV: 93 fL (ref 79–97)
MONOCYTES: 11 %
Monocytes Absolute: 0.7 10*3/uL (ref 0.1–0.9)
NEUTROS ABS: 3.9 10*3/uL (ref 1.4–7.0)
Neutrophils: 66 %
Platelets: 383 10*3/uL — ABNORMAL HIGH (ref 150–379)
RBC: 4.04 x10E6/uL (ref 3.77–5.28)
RDW: 15.3 % (ref 12.3–15.4)
WBC: 5.9 10*3/uL (ref 3.4–10.8)

## 2016-05-30 ENCOUNTER — Telehealth: Payer: Self-pay

## 2016-05-30 ENCOUNTER — Ambulatory Visit
Admission: RE | Admit: 2016-05-30 | Discharge: 2016-05-30 | Disposition: A | Discharge status: Home / Self Care | Payer: Medicare Other | Source: Ambulatory Visit | Attending: Internal Medicine | Admitting: Internal Medicine

## 2016-05-30 ENCOUNTER — Other Ambulatory Visit: Payer: Self-pay | Admitting: Internal Medicine

## 2016-05-30 DIAGNOSIS — R921 Mammographic calcification found on diagnostic imaging of breast: Secondary | ICD-10-CM

## 2016-05-30 HISTORY — PX: BREAST BIOPSY: SHX20

## 2016-05-30 NOTE — Telephone Encounter (Signed)
-----   Message from Melvyn Novas, MD sent at 05/28/2016  6:14 PM EDT ----- Very mild hypernatremia, thrombocytosis - no intervention needed

## 2016-05-30 NOTE — Telephone Encounter (Addendum)
I called pt to discuss her labs, no answer, left a detailed message on her cell phone, per DPR, explaining that her labs revealed mildly elevated sodium, with mildly elevated platelet count, but that Dr. Vickey Huger does not recommend any interventions at this time. I asked me to call me back if she has any questions or concerns.

## 2016-06-17 ENCOUNTER — Ambulatory Visit: Payer: Medicare Other | Attending: Neurology | Admitting: Physical Therapy

## 2016-06-17 DIAGNOSIS — R293 Abnormal posture: Secondary | ICD-10-CM | POA: Diagnosis present

## 2016-06-17 DIAGNOSIS — R2681 Unsteadiness on feet: Secondary | ICD-10-CM | POA: Diagnosis present

## 2016-06-17 DIAGNOSIS — R2689 Other abnormalities of gait and mobility: Secondary | ICD-10-CM

## 2016-06-17 DIAGNOSIS — M6281 Muscle weakness (generalized): Secondary | ICD-10-CM | POA: Diagnosis present

## 2016-06-17 NOTE — Therapy (Signed)
[facility] [address] [place], [place], [address] Phone: [phone]   Fax:  [phone]  Physical Therapy Evaluation  Patient Details  Name: Summer Holloway MRN: [id number] Date of Birth: [birth year] Referring Provider: Dohmeier  Encounter Date: 06/17/2016      PT End of Session - 06/17/16 1309    Visit Number 1   Number of Visits 9   Date for PT Re-Evaluation 08/16/16   Authorization Type [employer] Medicare-GCODE every 10th visit   PT Start Time 1102   PT Stop Time 1145   PT Time Calculation (min) 43 min   Activity Tolerance Patient tolerated treatment well   Behavior During Therapy [facility] for tasks assessed/performed      Past Medical History:  Diagnosis Date  . Anxiety   . Bursitis of left hip   . Depression   . Esophageal reflux   . Fibromyalgia   . Hearing loss   . Hyperlipemia   . Hyperlipidemia   . Hypertension   . Osteoarthritis   . PVC (premature ventricular contraction)   . Sciatica   . Tinnitus     Past Surgical History:  Procedure Laterality Date  . BREAST BIOPSY  05/24/2002  . CATARACT EXTRACTION, BILATERAL    . PARTIAL HYSTERECTOMY      There were no vitals filed for this visit.       Subjective Assessment - 06/17/16 1104    Subjective Pt reports diagnosis of MS at the beginning of the year.  Also have ataxia-difficulty of balance.  Reports at least one fall per month; usually trying to do more than one thing at a time when I have fallen.  Need to concentrate with turns.  Feels that R leg is weaker   Patient Stated Goals Pt's goal for therapy is to mostly need to know if some type of device would help.   Currently in Pain? No/denies  does report pain in R foot early am            [facility] PT Assessment - 06/17/16 0001      Assessment   Medical Diagnosis Multiple sclerosis, ataxia   Referring Provider Dohmeier   Onset Date/Surgical Date 05/27/16  MD visit     Precautions   Precautions Fall     Balance Screen   Has the patient fallen in the past 6 months Yes   How many times? 6   Has the patient had a decrease in activity level because of a fear of falling?  Yes   Is the patient reluctant to leave their home because of a fear of falling?  No     Home Environment   Living Environment Private residence   Living Arrangements Spouse/significant other   Available Help at Discharge Family   Type of Home House   Home Access Stairs to enter   Entrance Stairs-Number of Steps 3   Entrance Stairs-Rails None   Home Layout One level   Home Equipment None     Prior Function   Level of Independence Independent with basic ADLs;Independent with household mobility without device;Independent with community mobility without device   Vocation Retired   Leisure Enjoys going out in the community with husband, keeps their grandchild 3 days per week     Observation/Other Assessments   Focus on Therapeutic Outcomes (FOTO)  NA     Sensation   Light Touch Appears Intact  does report numbness in toes     Posture/Postural Control  Posture/Postural Control Postural limitations   Postural Limitations Rounded Shoulders;Forward head     ROM / Strength   AROM / PROM / Strength Strength     Strength   Overall Strength Within functional limits for tasks performed   Overall Strength Comments Grossly tested 4 to 4+/5 bilateral lower extremities     Transfers   Transfers Sit to Stand;Stand to Sit   Sit to Stand 6: Modified independent (Device/Increase time);Without upper extremity assist;From chair/3-in-1   Five time sit to stand comments  13.68 with strong posterior lean last rep   Stand to Sit 6: Modified independent (Device/Increase time);Without upper extremity assist;To chair/3-in-1   Comments Usually pushes up from surface with UEs     Ambulation/Gait   Ambulation/Gait Yes   Ambulation/Gait Assistance 5: Supervision   Ambulation Distance (Feet) 300 Feet   Assistive  device None   Gait Pattern Step-through pattern;Decreased arm swing - right;Decreased arm swing - left;Decreased step length - right;Decreased step length - left   Ambulation Surface Level;Indoor   Gait velocity 10.44 sec = 3.14 ft/sec     Standardized Balance Assessment   Standardized Balance Assessment Timed Up and Go Test;Dynamic Gait Index;Berg Balance Test     Berg Balance Test   Sit to Stand Able to stand without using hands and stabilize independently   Standing Unsupported Able to stand safely 2 minutes   Sitting with Back Unsupported but Feet Supported on Floor or Stool Able to sit safely and securely 2 minutes   Stand to Sit Controls descent by using hands   Transfers Able to transfer safely, minor use of hands   Standing Unsupported with Eyes Closed Able to stand 10 seconds with supervision   Standing Ubsupported with Feet Together Able to place feet together independently and stand for 1 minute with supervision   From Standing, Reach Forward with Outstretched Arm Can reach confidently >25 cm (10")   From Standing Position, Pick up Object from Floor Able to pick up shoe safely and easily   From Standing Position, Turn to Look Behind Over each Shoulder Looks behind from both sides and weight shifts well   Turn 360 Degrees Able to turn 360 degrees safely in 4 seconds or less   Standing Unsupported, Alternately Place Feet on Step/Stool Able to stand independently and safely and complete 8 steps in 20 seconds   Standing Unsupported, One Foot in Front Needs help to step but can hold 15 seconds   Standing on One Leg Tries to lift leg/unable to hold 3 seconds but remains standing independently   Total Score 47   Berg comment: Score of 47 indicates significant to high fall risk  Increased sway with narrow BOS-indicates trunk weakness     Dynamic Gait Index   Level Surface Mild Impairment   Change in Gait Speed Normal   Gait with Horizontal Head Turns Mild Impairment   Gait with  Vertical Head Turns Mild Impairment   Gait and Pivot Turn Normal   Step Over Obstacle Moderate Impairment   Step Around Obstacles Mild Impairment   Steps Mild Impairment   Total Score 17   DGI comment: Scores <19/24 indicate increased fall risk     Timed Up and Go Test   Normal TUG (seconds) 12.07   Manual TUG (seconds) 10.53   Cognitive TUG (seconds) 11.31   TUG Comments Scores >13.5-15 seconds indicates increased fall risk            Objective measurements completed on examination:  See above findings.                    PT Short Term Goals - 2016-06-23 1551      PT SHORT TERM GOAL #1   Title Same as LTGs= 4 weeks           PT Long Term Goals - Jun 23, 2016 1552      PT LONG TERM GOAL #1   Title Pt will be independent with HEP for improved core stability, balance and gait.  TARGET 07/17/16   Time 4   Period Weeks   Status New     PT LONG TERM GOAL #2   Title Pt will improve DGI score to at least 19/24 for decreased fall risk.   Time 4   Period Weeks   Status New     PT LONG TERM GOAL #3   Title Pt will improve Berg score to at least 51/56 for decreased fall risk.   Time 4   Period Weeks   Status New     PT LONG TERM GOAL #4   Title Pt will verbalize understanding of fall prevention in the home environment.   Time 4   Period Weeks   Status New     PT LONG TERM GOAL #5   Title Pt will verbalize understanding of MS-related resources for improve management of MS.   Time 4   Period Weeks   Status New     Additional Long Term Goals   Additional Long Term Goals Yes     PT LONG TERM GOAL #6   Title Pt will demo proper use of appropriate assistive device (cane vs walking pole) for safety with gait in home as needed, modified independently.   Time 4   Period Weeks   Status New                Plan - Jun 23, 2016 1546    Clinical Impression Statement Pt is a 66 year old female with diagnosis of MS in early 2018.  Pt presents with abnormal  posture, decreased balance, decreased functional strength with transfers, ataxic gait pattern, history of falls (at least one fall per month).  Pt is at fall risk per Sharlene Motts and DGI scores.  She would benefit from skilled physical therapy to address the aboves stated deficits for improved functional mobility and decreased fall risk.   History and Personal Factors relevant to plan of care: PMH includes anxiety, hip bursitis, depression, fibromyalgia, HTN, OA, sciatica and tinnitue   Clinical Presentation Stable   Clinical Decision Making Low   Rehab Potential Good   PT Frequency 2x / week   PT Duration 4 weeks  plus eval   PT Treatment/Interventions ADLs/Self Care Home Management;Functional mobility training;Gait training;DME Instruction;Patient/family education;Neuromuscular re-education;Balance training;Therapeutic exercise;Therapeutic activities   PT Next Visit Plan Initiate HEP:  transfers, core stability exercises and balance; gait training with cane versus walking pole for early morning when she feels more off balanced   Consulted and Agree with Plan of Care Patient      Patient will benefit from skilled therapeutic intervention in order to improve the following deficits and impairments:  Abnormal gait, Decreased balance, Decreased strength, Postural dysfunction, Difficulty walking  Visit Diagnosis: Other abnormalities of gait and mobility  Unsteadiness on feet  Abnormal posture  Muscle weakness (generalized)      G-Codes - June 23, 2016 1557    Functional Assessment Tool Used (Outpatient Only) 5x:  13.68 posterior lean, 3.14 ft/sec,  Berg 47/56, DGI 17/24; pt reports falls once/month (6 in past 6 months)   Functional Limitation Mobility: Walking and moving around   Mobility: Walking and Moving Around Current Status [phone]) At least 20 percent but less than 40 percent impaired, limited or restricted   Mobility: Walking and Moving Around Goal Status [phone]) At least 1 percent but less than  20 percent impaired, limited or restricted       Problem List There are no active problems to display for this patient.   Zyron Deeley W. 06/17/2016, 3:58 PM Gean Maidens., PT  [facility] [address] [place], [place], [address] Phone: [phone]   Fax:  [phone]  Name: CELISSE CIULLA MRN: [id number] Date of Birth: 1950-10-24

## 2016-07-07 ENCOUNTER — Ambulatory Visit: Payer: Medicare Other | Admitting: Physical Therapy

## 2016-07-08 ENCOUNTER — Ambulatory Visit: Payer: Medicare Other | Admitting: Physical Therapy

## 2016-07-08 DIAGNOSIS — M6281 Muscle weakness (generalized): Secondary | ICD-10-CM

## 2016-07-08 DIAGNOSIS — R293 Abnormal posture: Secondary | ICD-10-CM

## 2016-07-08 DIAGNOSIS — R2689 Other abnormalities of gait and mobility: Secondary | ICD-10-CM | POA: Diagnosis not present

## 2016-07-08 NOTE — Therapy (Signed)
[facility] [address] [place], [place], [address] Phone: [phone]   Fax:  [phone]  Physical Therapy Treatment  Patient Details  Name: Summer Holloway MRN: [id number] Date of Birth: 08-05-1950 Referring Provider: Dohmeier  Encounter Date: 07/08/2016      PT End of Session - 07/08/16 1503    Visit Number 2   Number of Visits 9   Date for PT Re-Evaluation 08/16/16   Authorization Type UHC Medicare-GCODE every 10th visit   PT Start Time 1150   PT Stop Time 1231   PT Time Calculation (min) 41 min   Activity Tolerance Patient tolerated treatment well   Behavior During Therapy [facility] for tasks assessed/performed      Past Medical History:  Diagnosis Date  . Anxiety   . Bursitis of left hip   . Depression   . Esophageal reflux   . Fibromyalgia   . Hearing loss   . Hyperlipemia   . Hyperlipidemia   . Hypertension   . Osteoarthritis   . PVC (premature ventricular contraction)   . Sciatica   . Tinnitus     Past Surgical History:  Procedure Laterality Date  . BREAST BIOPSY  05/24/2002  . CATARACT EXTRACTION, BILATERAL    . PARTIAL HYSTERECTOMY      There were no vitals filed for this visit.      Subjective Assessment - 07/08/16 1153    Subjective Pt reports having a good time at the beach last week.  Have had some very bad days, with fatigue a big factor.  Verbalizes understanding of being aware of heat and fatigue.   Patient Stated Goals Pt's goal for therapy is to mostly need to know if some type of device would help.   Currently in Pain? Yes   Pain Score 3    Pain Location --  hands, elbows, knees   Pain Orientation Right;Left   Pain Descriptors / Indicators Aching   Pain Type Acute pain   Pain Onset In the past 7 days   Pain Frequency Intermittent   Aggravating Factors  nothing specifically   Pain Relieving Factors [name]                         [facility] Adult PT  Treatment/Exercise - 07/08/16 1202      Exercises   Exercises Lumbar;Knee/Hip     Lumbar Exercises: Seated   Other Seated Lumbar Exercises Seated heel/toe raises x 10 reps each leg (advised to try to perform these first thing in the morning to offset early morning leg pains)     Lumbar Exercises: Supine   Clam 5 reps;3 seconds  1st set with no band, 2nd set with red theraband   Bent Knee Raise 10 reps  Hooklying marching    Bridge 5 reps;3 seconds  2 sets  with abdominal activation   Straight Leg Raise 5 reps;3 seconds   Other Supine Lumbar Exercises Supine pelvic tilts x 10 reps, 3 seconds     Knee/Hip Exercises: Stretches   Active Hamstring Stretch Left;2 reps  15 seconds-seated at edge of mat     Knee/Hip Exercises: Standing   Functional Squat 2 sets;5 reps  to elevated bed surface   Other Standing Knee Exercises Sit<>stand x 5 reps from 20" surface, then x 10 reps from elevated mat surface (cues provided for forward lean/improved technique for sit<>stand       Cues provided for breathing technique  and for activation of abdominal muscles during supine exercises for improved trunk control.         PT Education - 07/08/16 1502    Education provided Yes   Education Details HEP initiated-see instructions   Person(s) Educated Patient   Methods Explanation;Demonstration;Verbal cues;Handout   Comprehension Verbalized understanding;Returned demonstration          PT Short Term Goals - 06/17/16 1551      PT SHORT TERM GOAL #1   Title Same as LTGs= 4 weeks           PT Long Term Goals - 06/17/16 1552      PT LONG TERM GOAL #1   Title Pt will be independent with HEP for improved core stability, balance and gait.  TARGET 07/17/16   Time 4   Period Weeks   Status New     PT LONG TERM GOAL #2   Title Pt will improve DGI score to at least 19/24 for decreased fall risk.   Time 4   Period Weeks   Status New     PT LONG TERM GOAL #3   Title Pt will improve Berg  score to at least 51/56 for decreased fall risk.   Time 4   Period Weeks   Status New     PT LONG TERM GOAL #4   Title Pt will verbalize understanding of fall prevention in the home environment.   Time 4   Period Weeks   Status New     PT LONG TERM GOAL #5   Title Pt will verbalize understanding of MS-related resources for improve management of MS.   Time 4   Period Weeks   Status New     Additional Long Term Goals   Additional Long Term Goals Yes     PT LONG TERM GOAL #6   Title Pt will demo proper use of appropriate assistive device (cane vs walking pole) for safety with gait in home as needed, modified independently.   Time 4   Period Weeks   Status New               Plan - 07/08/16 1503    Clinical Impression Statement Pt returns today after eval 06/17/16 (was out of town on vacation).  Treatment session focused on initiation of HEP to address trunk and lower extremity strengthening.  Pt appears motivated to perform exercises at home.  Pt will continue to benefit from skilled PT to address strength, balance and gait.   Rehab Potential Good   PT Frequency 2x / week   PT Duration 4 weeks  plus eval   PT Treatment/Interventions ADLs/Self Care Home Management;Functional mobility training;Gait training;DME Instruction;Patient/family education;Neuromuscular re-education;Balance training;Therapeutic exercise;Therapeutic activities   PT Next Visit Plan Review HEP provided 07/08/16; gait training/addressing early morning time when more off-balance; continue transfers, core stability and balance   Consulted and Agree with Plan of Care Patient      Patient will benefit from skilled therapeutic intervention in order to improve the following deficits and impairments:  Abnormal gait, Decreased balance, Decreased strength, Postural dysfunction, Difficulty walking  Visit Diagnosis: Muscle weakness (generalized)  Abnormal posture     Problem List There are no active problems  to display for this patient.   Liahm Grivas W. 07/08/2016, 3:06 PM  Gean Maidens., PT   [facility] [address] [place], [place], [address] Phone: [phone]   Fax:  [phone]  Name: SHINAE TEGTMEIER MRN:  [id number] Date of Birth: 06/26/1950

## 2016-07-08 NOTE — Patient Instructions (Addendum)
PELVIC TILT: Posterior    Tighten abdominals, flatten low back. _Hold 3 seconds.  Perform 10__ reps per set, _1-2__ sets per day.   Copyright  VHI. All rights reserved.  Bridging    Slowly raise buttocks from floor, keeping stomach tight. Repeat __10__ times per set.  Hold for 3 seconds.  Do __1-2__ sessions per day.  http://orth.exer.us/1097   Copyright  VHI. All rights reserved.  Straight Leg Raise    Tighten stomach and slowly raise straightened right leg __4-6__ inches from floor.  Hold for 3 seconds and slowly lower down. Repeat __10__ times per set. Do __1-2_ sessions per day.  http://orth.exer.us/1103   Copyright  VHI. All rights reserved.  HIP / KNEE: Extension - Sit to Stand    Sitting, lean chest forward,  ("nose over toes") and raise hips up from surface. Straighten hips and knees. Weight bear equally on left and right sides. Backs of legs should not push off surface. _10__ reps per set, _1-2__ sets per day  Copyright  VHI. All rights reserved.  ANKLE: Pumps    Point toes down, then up. _10__ reps per set, __1-2_ sets per day   Copyright  VHI. All rights reserved.    Also added mini squats, stand>sit to bed surface, 5 reps

## 2016-07-09 ENCOUNTER — Ambulatory Visit: Payer: Medicare Other | Admitting: Physical Therapy

## 2016-07-09 DIAGNOSIS — M6281 Muscle weakness (generalized): Secondary | ICD-10-CM

## 2016-07-09 DIAGNOSIS — R2689 Other abnormalities of gait and mobility: Secondary | ICD-10-CM | POA: Diagnosis not present

## 2016-07-09 DIAGNOSIS — R293 Abnormal posture: Secondary | ICD-10-CM

## 2016-07-09 NOTE — Therapy (Signed)
[facility] [address] [place], [place], [address] Phone: [phone]   Fax:  [phone]  Physical Therapy Treatment  Patient Details  Name: Summer Holloway MRN: [id number] Date of Birth: 07-31-1950 Referring Provider: Dohmeier  Encounter Date: 07/09/2016      PT End of Session - 07/09/16 1213    Visit Number 3   Number of Visits 9   Date for PT Re-Evaluation 08/16/16   Authorization Type UHC Medicare-GCODE every 10th visit   PT Start Time 0847   PT Stop Time 0927   PT Time Calculation (min) 40 min   Activity Tolerance Patient tolerated treatment well   Behavior During Therapy [facility] for tasks assessed/performed      Past Medical History:  Diagnosis Date  . Anxiety   . Bursitis of left hip   . Depression   . Esophageal reflux   . Fibromyalgia   . Hearing loss   . Hyperlipemia   . Hyperlipidemia   . Hypertension   . Osteoarthritis   . PVC (premature ventricular contraction)   . Sciatica   . Tinnitus     Past Surgical History:  Procedure Laterality Date  . BREAST BIOPSY  05/24/2002  . CATARACT EXTRACTION, BILATERAL    . PARTIAL HYSTERECTOMY      There were no vitals filed for this visit.      Subjective Assessment - 07/09/16 0849    Subjective Paid attention to the R foot pain first thing this morning and tried the ankle pumps, but the pain seemed the same.   Patient Stated Goals Pt's goal for therapy is to mostly need to know if some type of device would help.   Currently in Pain? Yes   Pain Score 3    Pain Location --  all over   Pain Orientation Right;Left   Pain Descriptors / Indicators Aching   Pain Type Chronic pain   Pain Onset In the past 7 days   Pain Frequency --  there all the time   Aggravating Factors  nothing particular   Pain Relieving Factors Summer Holloway                         [facility] Adult PT Treatment/Exercise - 07/09/16 0852      Exercises    Exercises Lumbar;Knee/Hip     Lumbar Exercises: Stretches   Active Hamstring Stretch 3 reps  15 seconds   Active Hamstring Stretch Limitations used towel for assist, in supine; pt reports strain in neck, so therapist assists with stretch of L hamstring; switched to performing in sitting, 3 reps each leg   Single Knee to Chest Stretch 2 reps  15 seconds     Lumbar Exercises: Seated   Other Seated Lumbar Exercises Supine and seated heel/toe raises x 10 reps each      Lumbar Exercises: Supine   Bridge 5 reps;3 seconds   Straight Leg Raise 5 reps;3 seconds   Other Supine Lumbar Exercises Supine pelvic tilts 3sec hold x 5 reps     Knee/Hip Exercises: Stretches   Other Knee/Hip Stretches Seated heel cord stretch with towel, 3 reps 10 seconds     Knee/Hip Exercises: Aerobic   Nustep Nustep, Level 1, 4 extremities, 1 minute on, 1 minute rest, x 6 minutes total      Review of HEP given last visit-pt return demo understanding.          PT Education - 07/09/16  1211    Education provided Yes   Education Details Additions to [employer]) Educated Patient   Methods Explanation;Demonstration;Handout   Comprehension Verbalized understanding;Returned demonstration;Verbal cues required          PT Short Term Goals - 06/17/16 1551      PT SHORT TERM GOAL #1   Title Same as LTGs= 4 weeks           PT Long Term Goals - 06/17/16 1552      PT LONG TERM GOAL #1   Title Pt will be independent with HEP for improved core stability, balance and gait.  TARGET 07/17/16   Time 4   Period Weeks   Status New     PT LONG TERM GOAL #2   Title Pt will improve DGI score to at least 19/24 for decreased fall risk.   Time 4   Period Weeks   Status New     PT LONG TERM GOAL #3   Title Pt will improve Berg score to at least 51/56 for decreased fall risk.   Time 4   Period Weeks   Status New     PT LONG TERM GOAL #4   Title Pt will verbalize understanding of fall prevention in the  home environment.   Time 4   Period Weeks   Status New     PT LONG TERM GOAL #5   Title Pt will verbalize understanding of MS-related resources for improve management of MS.   Time 4   Period Weeks   Status New     Additional Long Term Goals   Additional Long Term Goals Yes     PT LONG TERM GOAL #6   Title Pt will demo proper use of appropriate assistive device (cane vs walking pole) for safety with gait in home as needed, modified independently.   Time 4   Period Weeks   Status New               Plan - 07/09/16 1216    Clinical Impression Statement Skilled PT session focused on review of HEP and progression of HEP.  Pt appears to be performing HEP correctly from yesterday's visit.  Will continue to be benefit from skilled PT to address strength, balance and gait.   Rehab Potential Good   PT Frequency 2x / week   PT Duration 4 weeks  plus eval   PT Treatment/Interventions ADLs/Self Care Home Management;Functional mobility training;Gait training;DME Instruction;Patient/family education;Neuromuscular re-education;Balance training;Therapeutic exercise;Therapeutic activities   PT Next Visit Plan Review HEP provided 07/09/16; continue core stability and balance   Consulted and Agree with Plan of Care Patient      Patient will benefit from skilled therapeutic intervention in order to improve the following deficits and impairments:  Abnormal gait, Decreased balance, Decreased strength, Postural dysfunction, Difficulty walking  Visit Diagnosis: Muscle weakness (generalized)  Abnormal posture     Problem List There are no active problems to display for this patient.   [name] 07/09/2016, 12:30 PM Gean Maidens., PT [facility] [address] [place], [place], [address] Phone: [phone]   Fax:  [phone]  Name: Summer Holloway MRN: [id number] Date of Birth: 10/02/1950

## 2016-07-09 NOTE — Patient Instructions (Addendum)
Knee to Chest (Flexion)    Pull knee toward chest. Feel stretch in lower back or buttock area. Breathing deeply, Hold ___15-30_ seconds. Repeat with other knee. Repeat __3__ times each leg. Do __1-2__ sessions per day.  http://gt2.exer.us/226   Copyright  VHI. All rights reserved.  Gastroc / Heel Cord Stretch - Seated With Towel    Sit and place towel around ball of foot. Gently pull foot in toward body, stretching heel cord and calf. Hold for _15-30__ seconds. Repeat on involved leg. Repeat _3__ times each time. Do _1-2__ times per day.  Copyright  VHI. All rights reserved.  HIP: Hamstrings - Short Sitting    Rest leg on the floor or raised surface. Keep knee straight. Lift chest. Hold __30_ seconds. _3__ reps per set, _1-2__ sets per day.  Copyright  VHI. All rights reserved.

## 2016-07-15 ENCOUNTER — Ambulatory Visit: Payer: Medicare Other | Attending: Neurology | Admitting: Physical Therapy

## 2016-07-15 ENCOUNTER — Encounter: Payer: Self-pay | Admitting: Physical Therapy

## 2016-07-15 DIAGNOSIS — R2689 Other abnormalities of gait and mobility: Secondary | ICD-10-CM | POA: Diagnosis present

## 2016-07-15 DIAGNOSIS — R293 Abnormal posture: Secondary | ICD-10-CM | POA: Insufficient documentation

## 2016-07-15 DIAGNOSIS — R2681 Unsteadiness on feet: Secondary | ICD-10-CM | POA: Diagnosis present

## 2016-07-15 DIAGNOSIS — M6281 Muscle weakness (generalized): Secondary | ICD-10-CM | POA: Insufficient documentation

## 2016-07-15 NOTE — Patient Instructions (Signed)
Knee to Chest (Flexion)    Pull knee toward chest. Feel stretch in lower back or buttock area. Breathing deeply, Hold ___15-30_ seconds. Repeat with other knee. Repeat __3__ times each leg. Do __1-2__ sessions per day.  http://gt2.exer.us/226   Copyright  VHI. All rights reserved.  Gastroc / Heel Cord Stretch - Seated With Towel    Sit and place towel around ball of foot. Gently pull foot in toward body, stretching heel cord and calf. Hold for _15-30__ seconds. Repeat on involved leg. Repeat _3__ times each time. Do _1-2__ times per day.  Copyright  VHI. All rights reserved.  HIP: Hamstrings - Short Sitting    Rest leg on the floor or raised surface. Keep knee straight. Lift chest. Hold __30_ seconds. _3__ reps per set, _1-2__ sets per day. PELVIC TILT: Posterior    Tighten abdominals, flatten low back. _Hold 3 seconds.  Perform 10__ reps per set, _1-2__ sets per day.   Copyright  VHI. All rights reserved.  Bridging    Slowly raise buttocks from floor, keeping stomach tight. Repeat __10__ times per set.  Hold for 3 seconds.  Do __1-2__ sessions per day.  http://orth.exer.us/1097   Copyright  VHI. All rights reserved.  Straight Leg Raise    Tighten stomach and slowly raise straightened right leg __4-6__ inches from floor.  Hold for 3 seconds and slowly lower down. Repeat __10__ times per set. Do __1-2_ sessions per day.  http://orth.exer.us/1103   Copyright  VHI. All rights reserved.  HIP / KNEE: Extension - Sit to Stand    Sitting, lean chest forward,  ("nose over toes") and raise hips up from surface. Straighten hips and knees. Weight bear equally on left and right sides. Backs of legs should not push off surface. _10__ reps per set, _1-2__ sets per day  Copyright  VHI. All rights reserved.  ANKLE: Pumps    Point toes down, then up. _10__ reps per set, __1-2_ sets per day   Also added mini squats, stand>sit to bed surface, 5  reps  Walking on Toes    Walk on toes along countertop while continuing on a straight path.  4 times Do _2___ sessions per day.  Copyright  VHI. All rights reserved.  Walking on Heels    Walk on heels beside countertop while continuing on a straight path. 4 times Do _2__ sessions per day.  Single Leg - Eyes Open    Holding support, lift right leg while maintaining balance over other leg. Progress to removing hands from support surface for longer periods of time. Hold__10__ seconds. Repeat __3 each leg__ times per session. Do _3___ sessions per day.   Feet Partial Heel-Toe, Head Motion - Eyes Open    STANDING IN A CORNER, CHAIR IN FRONT OF YOU FOR SUPPORT With eyes open, right foot partially in front of the other, move head slowly: up and down 10 times.  Side to side 10 times keeping your balance.  Repeat with left foot forwards. Repeat 1 times per session. Do 2 sessions per day.

## 2016-07-15 NOTE — Therapy (Signed)
[facility] [address] [place], [place], [address] Phone: [phone]   Fax:  [phone]  Physical Therapy Treatment  Patient Details  Name: Summer Holloway MRN: [id number] Date of Birth: 12/01/1950 Referring Provider: Dohmeier  Encounter Date: 07/15/2016      PT End of Session - 07/15/16 1025    Visit Number 4   Number of Visits 9   Date for PT Re-Evaluation 08/16/16   Authorization Type UHC Medicare-GCODE every 10th visit   PT Start Time 0935   PT Stop Time 1018   PT Time Calculation (min) 43 min   Activity Tolerance Patient tolerated treatment well;No increased pain   Behavior During Therapy [facility] for tasks assessed/performed      Past Medical History:  Diagnosis Date  . Anxiety   . Bursitis of left hip   . Depression   . Esophageal reflux   . Fibromyalgia   . Hearing loss   . Hyperlipemia   . Hyperlipidemia   . Hypertension   . Osteoarthritis   . PVC (premature ventricular contraction)   . Sciatica   . Tinnitus     Past Surgical History:  Procedure Laterality Date  . BREAST BIOPSY  05/24/2002  . CATARACT EXTRACTION, BILATERAL    . PARTIAL HYSTERECTOMY      There were no vitals filed for this visit.      Subjective Assessment - 07/15/16 0936    Subjective Pt reports having increased pain all over today; woke up hurting all over today.  Took a day trip to [place] and was on her feet shopping on Monday-may be mm pain due to that.  Tolerates HEP well at home.  Would like to work on walking today.   Patient Stated Goals Pt's goal for therapy is to mostly need to know if some type of device would help.   Currently in Pain? Yes   Pain Score 5    Pain Location Other (Comment)  all over   Pain Descriptors / Indicators Discomfort   Pain Type Chronic pain   Pain Onset Today   Pain Frequency Occasional                              Balance Exercises - 07/15/16 1023      Balance Exercises: Standing   Standing Eyes Opened Narrow base of support (BOS);Head turns;Solid surface;Other reps (comment)  Feet together and then partial tandem, 10 reps    SLS Eyes open;Intermittent upper extremity support;3 reps;10 secs  R and LLE   Balance Beam Standing forwards on balance beam performing forwards and retro step overs x 10 reps each LE on level ground, uphill on ramp and downhill on ramp for increased ankle and weight shift training   Tandem Gait Forward;Upper extremity support;4 reps     OTAGO PROGRAM   Heel Walking No support  4 reps   Toe Walk No support  4 reps     Walking on Toes    Walk on toes along countertop while continuing on a straight path.  4 times Do _2___ sessions per day.  Copyright  VHI. All rights reserved.  Walking on Heels    Walk on heels beside countertop while continuing on a straight path. 4 times Do _2__ sessions per day.  Single Leg - Eyes Open    Holding support, lift right leg while maintaining balance over other leg. Progress to removing hands from  support surface for longer periods of time. Hold__10__ seconds. Repeat __3 each leg__ times per session. Do _3___ sessions per day.   Feet Partial Heel-Toe, Head Motion - Eyes Open    STANDING IN A CORNER, CHAIR IN FRONT OF YOU FOR SUPPORT With eyes open, right foot partially in front of the other, move head slowly: up and down 10 times.  Side to side 10 times keeping your balance.  Repeat with left foot forwards. Repeat 1 times per session. Do 2 sessions per day.     PT Education - 07/15/16 1024    Education provided Yes   Education Details standing balance exercises added to HEP   Person(s) Educated Patient   Methods Explanation;Demonstration;Handout   Comprehension Verbalized understanding;Returned demonstration          PT Short Term Goals - 06/17/16 1551      PT SHORT TERM GOAL #1   Title Same as LTGs= 4 weeks           PT Long Term Goals -  06/17/16 1552      PT LONG TERM GOAL #1   Title Pt will be independent with HEP for improved core stability, balance and gait.  TARGET 07/17/16   Time 4   Period Weeks   Status New     PT LONG TERM GOAL #2   Title Pt will improve DGI score to at least 19/24 for decreased fall risk.   Time 4   Period Weeks   Status New     PT LONG TERM GOAL #3   Title Pt will improve Berg score to at least 51/56 for decreased fall risk.   Time 4   Period Weeks   Status New     PT LONG TERM GOAL #4   Title Pt will verbalize understanding of fall prevention in the home environment.   Time 4   Period Weeks   Status New     PT LONG TERM GOAL #5   Title Pt will verbalize understanding of MS-related resources for improve management of MS.   Time 4   Period Weeks   Status New     Additional Long Term Goals   Additional Long Term Goals Yes     PT LONG TERM GOAL #6   Title Pt will demo proper use of appropriate assistive device (cane vs walking pole) for safety with gait in home as needed, modified independently.   Time 4   Period Weeks   Status New               Plan - 07/15/16 1025    Clinical Impression Statement Continued to focus on HEP with addition of dynamic standing balance exercises to focus on ankle and hip strengthening and training of ankle and hip balance reactions.  Pt tolerated well but did require 2 seated rest breaks due to fatigue.  Pt with greatest difficulty with narrow, tandem BOS.  Will continue to address and progress towards LTG.   Rehab Potential Good   PT Frequency 2x / week   PT Duration 4 weeks  plus eval   PT Treatment/Interventions ADLs/Self Care Home Management;Functional mobility training;Gait training;DME Instruction;Patient/family education;Neuromuscular re-education;Balance training;Therapeutic exercise;Therapeutic activities   PT Next Visit Plan Review HEP, continue core stability and balance, balance reactions   Consulted and Agree with Plan of Care  Patient      Patient will benefit from skilled therapeutic intervention in order to improve the following deficits and impairments:  Abnormal gait,  Decreased balance, Decreased strength, Postural dysfunction, Difficulty walking  Visit Diagnosis: Muscle weakness (generalized)  Abnormal posture  Other abnormalities of gait and mobility  Unsteadiness on feet     Problem List There are no active problems to display for this patient.   Edman Circle, PT, DPT 07/15/16    10:28 AM    [facility] [address] [place], [place], [address] Phone: [phone]   Fax:  [phone]  Name: Summer Holloway MRN: [id number] Date of Birth: [birth year]

## 2016-07-22 ENCOUNTER — Ambulatory Visit: Payer: Medicare Other | Admitting: Physical Therapy

## 2016-07-22 DIAGNOSIS — M6281 Muscle weakness (generalized): Secondary | ICD-10-CM | POA: Diagnosis not present

## 2016-07-22 DIAGNOSIS — R293 Abnormal posture: Secondary | ICD-10-CM

## 2016-07-22 DIAGNOSIS — R2681 Unsteadiness on feet: Secondary | ICD-10-CM

## 2016-07-22 NOTE — Therapy (Signed)
[facility] [address] [place], [place], [address] Phone: [phone]   Fax:  [phone]  Physical Therapy Treatment  Patient Details  Name: Summer Holloway MRN: [id number] Date of Birth: [birth year] Referring Provider: Dohmeier  Encounter Date: 07/22/2016      PT End of Session - 07/22/16 1515    Visit Number 5   Number of Visits 9   Date for PT Re-Evaluation 08/16/16   Authorization Type UHC Medicare-GCODE every 10th visit   PT Start Time 1019   PT Stop Time 1100   PT Time Calculation (min) 41 min   Activity Tolerance Patient tolerated treatment well;No increased pain   Behavior During Therapy WFL for tasks assessed/performed      Past Medical History:  Diagnosis Date  . Anxiety   . Bursitis of left hip   . Depression   . Esophageal reflux   . Fibromyalgia   . Hearing loss   . Hyperlipemia   . Hyperlipidemia   . Hypertension   . Osteoarthritis   . PVC (premature ventricular contraction)   . Sciatica   . Tinnitus     Past Surgical History:  Procedure Laterality Date  . BREAST BIOPSY  05/24/2002  . CATARACT EXTRACTION, BILATERAL    . PARTIAL HYSTERECTOMY      There were no vitals filed for this visit.      Subjective Assessment - 07/22/16 1022    Subjective No changes, the exercises for ankles has really helped in the morning.  The exercises last visit were very hard.   Patient Stated Goals Pt's goal for therapy is to mostly need to know if some type of device would help.   Currently in Pain? Yes   Pain Score 3    Pain Location --  all over   Pain Orientation Right;Left   Pain Descriptors / Indicators Discomfort   Pain Type Chronic pain   Pain Onset Today   Pain Frequency Occasional   Aggravating Factors  nothing particular   Pain Relieving Factors Exedrin, Aleve                         [facility] Adult PT Treatment/Exercise - 07/22/16 0001      Neuro Re-ed    Neuro  Re-ed Details  Review of HEP provided last visit:  heel walking 4 reps along counter.  Toe walking along counter, 4 reps.  Pt attempts without support and leans into counter several times.  PT advised pt to lightly hold to counter for support and to use visual targets for stability.  Reviewed single limb stance, with cues for support needed for stability. Reviewed semi-tandem stance in corner with head turns and head nods.  Recommended patient perform with light UE support needed for stability and to have smaller range of head movements to start, for improved balance with this activity.     Lumbar Exercises: Seated   Long Arc Quad on [place] AROM;Right;Left;5 reps   Hip Flexion on Ball AROM;Right;Left;10 reps   Hip Flexion on Ball Limitations Alternating UE/lower extremity lifts x 5 reps with min guard assistance   Other Seated Lumbar Exercises Seated on 75 cm therapy ball:  anterior/posterior pelvic tilts x 10 reps, lateral pelvic tilts x 10 reps, then alternating UE lifts x 10, bilateral UE lifts x 10, then trunk rotation x 10 reps with outstretched arms.  Min guard/supervision, with cues for abdominal activation and upright posture, all for improved  core stability.    With balance activities in corner for semi-tandem stance, also worked on focus on visual targets with head turns.  Pt tries multiple attempts of corner balance and SLS activities without UE support, and needs cues for UE support for improved stability.         Balance Exercises - 07/22/16 1510      Balance Exercises: Standing   Tandem Gait Forward;Upper extremity support;4 reps  Progressing to tandem march, UE support, 4 reps   Marching Limitations Marching forwards along counter, 4 reps              PT Short Term Goals - 06/17/16 1551      PT SHORT TERM GOAL #1   Title Same as LTGs= 4 weeks           PT Long Term Goals - 07/22/16 1516      PT LONG TERM GOAL #1   Title Pt will be independent with HEP for  improved core stability, balance and gait.  Updated TARGET, as weeks remain in POC:  08/01/16   Time 4   Period Weeks   Status On-going     PT LONG TERM GOAL #2   Title Pt will improve DGI score to at least 19/24 for decreased fall risk.   Time 4   Period Weeks   Status On-going     PT LONG TERM GOAL #3   Title Pt will improve Berg score to at least 51/56 for decreased fall risk.   Time 4   Period Weeks   Status On-going     PT LONG TERM GOAL #4   Title Pt will verbalize understanding of fall prevention in the home environment.   Time 4   Period Weeks   Status On-going     PT LONG TERM GOAL #5   Title Pt will verbalize understanding of MS-related resources for improve management of MS.   Time 4   Period Weeks   Status On-going     PT LONG TERM GOAL #6   Title Pt will demo proper use of appropriate assistive device (cane vs walking pole) for safety with gait in home as needed, modified independently.   Time 4   Period Weeks   Status On-going               Plan - 07/22/16 1516    Clinical Impression Statement Skilled PT session focused today on review of standing balance HEP, continued work on narrow BOS and dynamic SLS activities, and core stability exercises.  Pt needs multiple cues through session for abdominal activation, use of visual targets and use of minimal UE support as needed for improved stability with standing exercises.  Note updated targets for LTGs, as one week remains after this one, in POC.  pt will continue to benefit from skilled PT to work towards LTGs.   Rehab Potential Good   PT Frequency 2x / week   PT Duration 4 weeks  plus eval   PT Treatment/Interventions ADLs/Self Care Home Management;Functional mobility training;Gait training;DME Instruction;Patient/family education;Neuromuscular re-education;Balance training;Therapeutic exercise;Therapeutic activities   PT Next Visit Plan Continue core stability and balance, balance reactions, compliant  surfaces   Consulted and Agree with Plan of Care Patient      Patient will benefit from skilled therapeutic intervention in order to improve the following deficits and impairments:  Abnormal gait, Decreased balance, Decreased strength, Postural dysfunction, Difficulty walking  Visit Diagnosis: Unsteadiness on feet  Abnormal posture  Problem List There are no active problems to display for this patient.   Ennifer Harston W. 07/22/2016, 3:20 PM  Gean Maidens., PT   [facility] [address] [place], [place], [address] Phone: [phone]   Fax:  [phone]  Name: Summer Holloway MRN: [id number] Date of Birth: 06-06-1950

## 2016-07-23 ENCOUNTER — Ambulatory Visit: Payer: Medicare Other | Admitting: Physical Therapy

## 2016-07-23 DIAGNOSIS — R2681 Unsteadiness on feet: Secondary | ICD-10-CM

## 2016-07-23 DIAGNOSIS — R293 Abnormal posture: Secondary | ICD-10-CM

## 2016-07-23 DIAGNOSIS — M6281 Muscle weakness (generalized): Secondary | ICD-10-CM | POA: Diagnosis not present

## 2016-07-23 NOTE — Therapy (Signed)
[facility] [address] [place], [place], [address] Phone: [phone]   Fax:  [phone]  Physical Therapy Treatment  Patient Details  Name: Summer Holloway MRN: [id number] Date of Birth: 12/22/1950 Referring Provider: Dohmeier  Encounter Date: 07/23/2016      PT End of Session - 07/23/16 2121    Visit Number 6   Number of Visits 9   Date for PT Re-Evaluation 08/16/16   Authorization Type UHC Medicare-GCODE every 10th visit   PT Start Time 1025  computer issues-could not pull up chart on frozen screen   PT Stop Time 1105   PT Time Calculation (min) 40 min   Activity Tolerance Patient tolerated treatment well;No increased pain   Behavior During Therapy WFL for tasks assessed/performed      Past Medical History:  Diagnosis Date  . Anxiety   . Bursitis of left hip   . Depression   . Esophageal reflux   . Fibromyalgia   . Hearing loss   . Hyperlipemia   . Hyperlipidemia   . Hypertension   . Osteoarthritis   . PVC (premature ventricular contraction)   . Sciatica   . Tinnitus     Past Surgical History:  Procedure Laterality Date  . BREAST BIOPSY  05/24/2002  . CATARACT EXTRACTION, BILATERAL    . PARTIAL HYSTERECTOMY      There were no vitals filed for this visit.                       [facility] Adult PT Treatment/Exercise - 07/23/16 0001      Lumbar Exercises: Seated   Long Arc Quad on [place] AROM;Right;Left;10 reps   Hip Flexion on Ball AROM;Right;Left;10 reps   Hip Flexion on Ball Limitations Alternating UE/lower extremity lifts x 5 reps with min guard assistance  2 sets   Other Seated Lumbar Exercises Seated on 75 cm therapy ball:  anterior/posterior pelvic tilts x 10 reps, lateral pelvic tilts x 10 reps, then alternating UE lifts x 10, bilateral UE lifts x 10, then trunk rotation x 10 reps with outstretched arms.  Supervision, with cues for abdominal activation and upright posture,  all for improved core stability.     Knee/Hip Exercises: Aerobic   Other Aerobic Seated foot pedaler x 3 minutes forward, 3 minutes back at moderate resistance             Balance Exercises - 07/23/16 2114      Balance Exercises: Standing   Tandem Gait Forward;Upper extremity support;4 reps  Progressing to tandem march   Marching Limitations Marching forwards along counter, 4 reps    Other Standing Exercises Standing on foam mat:  marching in place x 10 reps, alternating forward kicks x10 reps, then alternating forward step taps x 10 reps, alternating back step taps x 10 reps, then alternating side step taps x 10 reps with intermittent UE support           PT Education - 07/23/16 2120    Education provided Yes   Education Details HEP additions for core stability; discussed organizing exercises and performing at different times of day or different days of the week to avoid fatigue-provided exercise chart for visual   Person(s) Educated Patient   Methods Explanation;Demonstration;Handout   Comprehension Verbalized understanding;Returned demonstration          PT Short Term Goals - 06/17/16 1551      PT SHORT TERM GOAL #1   Title  Same as LTGs= 4 weeks           PT Long Term Goals - 07/22/16 1516      PT LONG TERM GOAL #1   Title Pt will be independent with HEP for improved core stability, balance and gait.  Updated TARGET, as weeks remain in POC:  08/01/16   Time 4   Period Weeks   Status On-going     PT LONG TERM GOAL #2   Title Pt will improve DGI score to at least 19/24 for decreased fall risk.   Time 4   Period Weeks   Status On-going     PT LONG TERM GOAL #3   Title Pt will improve Berg score to at least 51/56 for decreased fall risk.   Time 4   Period Weeks   Status On-going     PT LONG TERM GOAL #4   Title Pt will verbalize understanding of fall prevention in the home environment.   Time 4   Period Weeks   Status On-going     PT LONG TERM  GOAL #5   Title Pt will verbalize understanding of MS-related resources for improve management of MS.   Time 4   Period Weeks   Status On-going     PT LONG TERM GOAL #6   Title Pt will demo proper use of appropriate assistive device (cane vs walking pole) for safety with gait in home as needed, modified independently.   Time 4   Period Weeks   Status On-going               Plan - 07/23/16 2122    Clinical Impression Statement Patient has large therapy ball at home and requests to have core stability exercises from last visit added to HEP.  Worked on these and added to HEP, but did discuss organizing exercises and spreading them out to avoid over-fatigue or burnout on exercise program.  Address dynamic balance activities as well.  Pt will continue to benefit from skilled PT to work on balance, gait and strengthening.   Rehab Potential Good   PT Frequency 2x / week   PT Duration 4 weeks  plus eval   PT Treatment/Interventions ADLs/Self Care Home Management;Functional mobility training;Gait training;DME Instruction;Patient/family education;Neuromuscular re-education;Balance training;Therapeutic exercise;Therapeutic activities   PT Next Visit Plan Review corner and standing balance activities as needed; continue to work on balance reactions and compliant surfaces.  NEXT week of visits if WEEK 4-need to check LTGs and discuss POC   Consulted and Agree with Plan of Care Patient      Patient will benefit from skilled therapeutic intervention in order to improve the following deficits and impairments:  Abnormal gait, Decreased balance, Decreased strength, Postural dysfunction, Difficulty walking  Visit Diagnosis: Abnormal posture  Unsteadiness on feet     Problem List There are no active problems to display for this patient.   MARRIOTT,AMY W. 07/23/2016, 9:27 PM  Gean Maidens., PT   [facility] [address] [place], [place], [address] Phone: [phone]   Fax:  [phone]  Name: IVALEE AMABILE MRN: [id number] Date of Birth: 1950-08-13

## 2016-07-23 NOTE — Patient Instructions (Addendum)
FUNCTIONAL MOBILITY: Marching (Therapy Ball)    Sit on therapy ball. Raise left leg then right to march in place. Alternate. __10_ reps per set, _1-2__ sets per day, __3_ days per week  Copyright  VHI. All rights reserved.  One-Leg Balance    Kick right leg in front. Then relax.  Kick left leg, then relax.  Repeat __5-10_ times, alternating legs. Do _3__ times per week.  Copyright  VHI. All rights reserved.  Tips    1.Sit on ball with knees in line with hips. Use the muscles of the torso (tighten your stomach muscles towards your spine) to sit in good posture with shoulders over hips and ears over shoulders. 2.When the core muscles tire, discomfort might be felt in the back. If this happens, stop the exercise and rest. 3.If feeling unsteady on the ball, hold support with one or both hands until you feel stable. 4.All core exercises can also be performed in a chair if desired. 5.Pace yourself so that when you are exercising you can still talk. 6.Unless stated otherwise, breathe normally. Never hold your breath! 7.Core conditioning exercises should be practiced at least _3__ times per week.   Additional seated ball exercises:   Alternating UE lifts x 10, clasped hand UE lifts x 10, clasped hand trunk rotation x 10 Alternating UE/lower extremity lift x 10    (Exercise) Monday Tuesday Wednesday Thursday Friday Saturday Sunday   Bed Exercises            Sit to stand           Standing Balance           Seated ball/core exercises

## 2016-07-29 ENCOUNTER — Ambulatory Visit: Payer: Medicare Other | Admitting: Adult Health

## 2016-08-05 ENCOUNTER — Ambulatory Visit: Payer: Medicare Other | Admitting: Physical Therapy

## 2016-08-05 DIAGNOSIS — R293 Abnormal posture: Secondary | ICD-10-CM

## 2016-08-05 DIAGNOSIS — M6281 Muscle weakness (generalized): Secondary | ICD-10-CM

## 2016-08-05 DIAGNOSIS — R2681 Unsteadiness on feet: Secondary | ICD-10-CM

## 2016-08-05 NOTE — Therapy (Signed)
[facility] [address] [place], [place], [address] Phone: [phone]   Fax:  [phone]  Physical Therapy Treatment  Patient Details  Name: Summer Holloway MRN: [id number] Date of Birth: 05-02-1950 Referring Provider: Dohmeier  Encounter Date: 08/05/2016      PT End of Session - 08/05/16 2228    Visit Number 7   Number of Visits 9   Date for PT Re-Evaluation 08/16/16   Authorization Type UHC Medicare-GCODE every 10th visit   PT Start Time 1022   PT Stop Time 1104   PT Time Calculation (min) 42 min   Activity Tolerance Patient tolerated treatment well;No increased pain   Behavior During Therapy WFL for tasks assessed/performed      Past Medical History:  Diagnosis Date  . Anxiety   . Bursitis of left hip   . Depression   . Esophageal reflux   . Fibromyalgia   . Hearing loss   . Hyperlipemia   . Hyperlipidemia   . Hypertension   . Osteoarthritis   . PVC (premature ventricular contraction)   . Sciatica   . Tinnitus     Past Surgical History:  Procedure Laterality Date  . BREAST BIOPSY  05/24/2002  . CATARACT EXTRACTION, BILATERAL    . PARTIAL HYSTERECTOMY      There were no vitals filed for this visit.      Subjective Assessment - 08/05/16 1024    Subjective Was out of town at [employer], but I cried everyday, due to tooth cracking and tooth pain.  No falls.   Currently in Pain? Yes   Pain Score 2    Pain Location --  all over   Pain Descriptors / Indicators Discomfort   Pain Type Chronic pain   Pain Frequency Occasional   Aggravating Factors  stress aggravates   Pain Relieving Factors Exedrin, Aleve                         [facility] Adult PT Treatment/Exercise - 08/05/16 0001      Ambulation/Gait   Ambulation/Gait Yes   Ambulation/Gait Assistance 5: Supervision   Ambulation Distance (Feet) 200 Feet  x 2, 120 ft no device   Assistive device Straight cane  single  walking pole   Gait Pattern Step-through pattern;Decreased arm swing - right;Decreased arm swing - left;Decreased step length - right;Decreased step length - left;Ataxic;Wide base of support   Ambulation Surface Level;Indoor   Gait Comments Gait training using single walking pole with attempts to use pole in reciprocal pattern (RUE and LLE), but difficult to sequence with each step due to height of walking pole.  Gait training with cane, with initial cues, for reciprocal sequence with RUE and LLE.  Pt able to sequence cane better than walking pole.  Gait with walking pole with head turns and head nods, with min guard assist, veering to the L.     Standardized Balance Assessment   Standardized Balance Assessment Dynamic Gait Index     Dynamic Gait Index   Level Surface Mild Impairment   Change in Gait Speed Mild Impairment   Gait with Horizontal Head Turns Moderate Impairment   Gait with Vertical Head Turns Moderate Impairment   Gait and Pivot Turn Normal   Step Over Obstacle Mild Impairment   Step Around Obstacles Mild Impairment   Steps Moderate Impairment   Total Score 14     High Level Balance   High Level Balance  Comments Reviewed patient's full HEP:  corner balance exercises, marching forward, SLS, heelwalking and toe walking.      Exercises   Other Exercises  Reviewed pt's full HEP:  pelvic tilts, bridging, SLR, sit<>stand, minisquats, ankle pumps.  Reviewed seated core stability exercises on blue therapy ball, given as HEP last visit.  Pt able to return demo understanding.                  PT Short Term Goals - 06/17/16 1551      PT SHORT TERM GOAL #1   Title Same as LTGs= 4 weeks           PT Long Term Goals - 08/05/16 1049      PT LONG TERM GOAL #1   Title Pt will be independent with HEP for improved core stability, balance and gait.  Updated TARGET, as weeks remain in POC:  08/01/16   Time 4   Period Weeks   Status Achieved     PT LONG TERM GOAL #2    Title Pt will improve DGI score to at least 19/24 for decreased fall risk.   Time 4   Period Weeks   Status On-going     PT LONG TERM GOAL #3   Title Pt will improve Berg score to at least 51/56 for decreased fall risk.   Time 4   Period Weeks   Status On-going     PT LONG TERM GOAL #4   Title Pt will verbalize understanding of fall prevention in the home environment.   Time 4   Period Weeks   Status On-going     PT LONG TERM GOAL #5   Title Pt will verbalize understanding of MS-related resources for improve management of MS.   Time 4   Period Weeks   Status On-going     PT LONG TERM GOAL #6   Title Pt will demo proper use of appropriate assistive device (cane vs walking pole) for safety with gait in home as needed, modified independently.   Time 4   Period Weeks   Status On-going               Plan - 08/05/16 2229    Clinical Impression Statement Pt has been out of town at [employer] (and not feeling well due to tooth issue), so did not perform HEP this past week.  She does demonstrate independence with HEP upon review.  Gait training activities with cane and walking pole, as options for improved stability for longer distance walking.  Will check remaining goals and discuss d/c versus renew next visit.   Rehab Potential Good   PT Frequency 2x / week   PT Duration 4 weeks  plus eval   PT Treatment/Interventions ADLs/Self Care Home Management;Functional mobility training;Gait training;DME Instruction;Patient/family education;Neuromuscular re-education;Balance training;Therapeutic exercise;Therapeutic activities   PT Next Visit Plan Check remaining LTGs; gait with cane versus walking pole (pt to bring her own).  Discuss discharge versus renew based on progress   Consulted and Agree with Plan of Care Patient      Patient will benefit from skilled therapeutic intervention in order to improve the following deficits and impairments:  Abnormal gait, Decreased balance, Decreased  strength, Postural dysfunction, Difficulty walking  Visit Diagnosis: Unsteadiness on feet  Muscle weakness (generalized)  Abnormal posture     Problem List There are no active problems to display for this patient.   MARRIOTT,AMY W. 08/05/2016, 10:33 PM  Lonia Blood W., PT  [facility] [address] [place], [place], [address] Phone: [phone]   Fax:  [phone]  Name: Summer Holloway MRN: [id number] Date of Birth: 03/12/1950

## 2016-08-06 ENCOUNTER — Ambulatory Visit: Payer: Medicare Other | Admitting: Physical Therapy

## 2016-08-06 DIAGNOSIS — R2681 Unsteadiness on feet: Secondary | ICD-10-CM

## 2016-08-06 DIAGNOSIS — M6281 Muscle weakness (generalized): Secondary | ICD-10-CM | POA: Diagnosis not present

## 2016-08-06 DIAGNOSIS — R2689 Other abnormalities of gait and mobility: Secondary | ICD-10-CM

## 2016-08-06 NOTE — Therapy (Signed)
[facility] [address] [place], [place], [address] Phone: [phone]   Fax:  [phone]  Physical Therapy Treatment  Patient Details  Name: Summer Holloway MRN: [id number] Date of Birth: July 03, 1950 Referring Provider: Dohmeier  Encounter Date: 08/06/2016      PT End of Session - 08/06/16 1823    Visit Number 8   Number of Visits 9   Date for PT Re-Evaluation 08/16/16   Authorization Type [employer] Medicare-GCODE every 10th visit   PT Start Time 1017   PT Stop Time 1107   PT Time Calculation (min) 50 min   Activity Tolerance Patient tolerated treatment well;Other (comment)  Pt reports "sad, depressed" over MS   Behavior During Therapy [facility] for tasks assessed/performed      Past Medical History:  Diagnosis Date  . Anxiety   . Bursitis of left hip   . Depression   . Esophageal reflux   . Fibromyalgia   . Hearing loss   . Hyperlipemia   . Hyperlipidemia   . Hypertension   . Osteoarthritis   . PVC (premature ventricular contraction)   . Sciatica   . Tinnitus     Past Surgical History:  Procedure Laterality Date  . BREAST BIOPSY  05/24/2002  . CATARACT EXTRACTION, BILATERAL    . PARTIAL HYSTERECTOMY      There were no vitals filed for this visit.      Subjective Assessment - 08/06/16 1024    Subjective Mouth feels better today.  I liked the cane yesterday and we might want to get one like it.   Patient Stated Goals Pt's goal for therapy is to mostly need to know if some type of device would help.   Currently in Pain? No/denies                         [facility] Adult PT Treatment/Exercise - 08/06/16 1033      Ambulation/Gait   Ambulation/Gait Yes   Ambulation/Gait Assistance 5: Supervision;6: Modified independent (Device/Increase time)   Ambulation/Gait Assistance Details initial instruction in cane use-pt better able to sequence cane versus last visit.   Ambulation Distance (Feet) 320  Feet  x 2   Assistive device Straight cane   Gait Pattern Step-through pattern;Decreased arm swing - right;Decreased arm swing - left;Decreased step length - right;Decreased step length - left  with cane   Ambulation Surface Level;Indoor   Stairs Yes   Stairs Assistance 5: Supervision;6: Modified independent (Device/Increase time)   Stair Management Technique With cane;No rails;Step to pattern   Number of Stairs 4  x 2    Height of Stairs 6     Standardized Balance Assessment   Standardized Balance Assessment Berg Balance Test     Berg Balance Test   Sit to Stand Able to stand without using hands and stabilize independently   Standing Unsupported Able to stand safely 2 minutes   Sitting with Back Unsupported but Feet Supported on Floor or Stool Able to sit safely and securely 2 minutes   Stand to Sit Sits safely with minimal use of hands   Transfers Able to transfer safely, minor use of hands   Standing Unsupported with Eyes Closed Able to stand 10 seconds with supervision   Standing Ubsupported with Feet Together Able to place feet together independently and stand 1 minute safely   From Standing, Reach Forward with Outstretched Arm Can reach forward >12 cm safely (5")   From  Standing Position, Pick up Object from Floor Able to pick up shoe safely and easily   From Standing Position, Turn to Look Behind Over each Shoulder Looks behind from both sides and weight shifts well   Turn 360 Degrees Able to turn 360 degrees safely but slowly   Standing Unsupported, Alternately Place Feet on Step/Stool Able to stand independently and safely and complete 8 steps in 20 seconds   Standing Unsupported, One Foot in Front Able to place foot tandem independently and hold 30 seconds   Standing on One Leg Tries to lift leg/unable to hold 3 seconds but remains standing independently   Total Score 49     Self-Care   Self-Care Other Self-Care Comments   Other Self-Care Comments  Discussed fall  prevention education in the home; discussed where/how to obtain single point cane for home/community use; discussed POC and goal assessment; discussed options for continueing therapy versus d/c this visit.  Pt/husband feel that pt can do exercises at home and are okay with discharge this visit.  Explained criteria for return to therapy.  Discussed MS-related resources, including local chapter of MS society, support group and programs.       Showed patient how to adjust cane to correct height.  Reiterated improved gait pattern with cane versus using walking pole (last visit).          PT Education - 08/06/16 1821    Education provided Yes   Education Details Fall prevention, where/how to obtain cane; POC and progress with goals; discussed MS-related resources; discussed pt's recent feelings of crying, "being depressed" over MS-advised to speak with physician/neurologist   Person(s) Educated Patient;Spouse   Methods Explanation;Handout   Comprehension Verbalized understanding          PT Short Term Goals - 06/17/16 1551      PT SHORT TERM GOAL #1   Title Same as LTGs= 4 weeks           PT Long Term Goals - 08/06/16 1025      PT LONG TERM GOAL #1   Title Pt will be independent with HEP for improved core stability, balance and gait.  Updated TARGET, as weeks remain in POC:  08/01/16   Time 4   Period Weeks   Status Achieved     PT LONG TERM GOAL #2   Title Pt will improve DGI score to at least 19/24 for decreased fall risk.   Time 4   Period Weeks   Status Not Met     PT LONG TERM GOAL #3   Title Pt will improve Berg score to at least 51/56 for decreased fall risk.   Time 4   Period Weeks   Status Not Met     PT LONG TERM GOAL #4   Title Pt will verbalize understanding of fall prevention in the home environment.   Time 4   Period Weeks   Status Achieved     PT LONG TERM GOAL #5   Title Pt will verbalize understanding of MS-related resources for improve  management of MS.   Time 4   Period Weeks   Status Achieved     PT LONG TERM GOAL #6   Title Pt will demo proper use of appropriate assistive device (cane vs walking pole) for safety with gait in home as needed, modified independently.   Time 4   Period Weeks   Status Achieved  Plan - August 18, 2016 1825    Clinical Impression Statement Pt has met LTG 1 and LTG 4-6.  LTG 2-3 not met.  Pt has improved slightly on Berg balance score, but continues to demonstrate dynamic balance deficits.  Instructed patient in use of cane this week, which she appears to have improved steadiness and decreased ataxic gait.  Patient has been given HEP to address strength and balance and is appropriate for discharge at this time.   Rehab Potential Good   PT Frequency 2x / week   PT Duration 4 weeks  plus eval   PT Treatment/Interventions ADLs/Self Care Home Management;Functional mobility training;Gait training;DME Instruction;Patient/family education;Neuromuscular re-education;Balance training;Therapeutic exercise;Therapeutic activities   PT Next Visit Plan Discharge this visit.   Consulted and Agree with Plan of Care Patient;Family member/caregiver   Family Member Consulted Husband      Patient will benefit from skilled therapeutic intervention in order to improve the following deficits and impairments:  Abnormal gait, Decreased balance, Decreased strength, Postural dysfunction, Difficulty walking  Visit Diagnosis: Other abnormalities of gait and mobility  Unsteadiness on feet       G-Codes - 08/18/16 1827    Functional Assessment Tool Used (Outpatient Only) Berg 49/56, DGI 15/24; no falls during POC of PT   Functional Limitation Mobility: Walking and moving around   Mobility: Walking and Moving Around Goal Status [phone]) At least 1 percent but less than 20 percent impaired, limited or restricted   Mobility: Walking and Moving Around Discharge Status [phone]) At least 20 percent but  less than 40 percent impaired, limited or restricted      Problem List There are no active problems to display for this patient.   MARRIOTT,AMY W. 2016-08-18, 6:29 PM  MARRIOTT,AMY W., PT   PHYSICAL THERAPY DISCHARGE SUMMARY  Visits from Start of Care: 8  Current functional level related to goals / functional outcomes: See LTGs noted above-pt has met 4 of 6 LTGs   Remaining deficits: Balance, intermittent pain, strength-HEP has been given to address   Education / Equipment: Educated in [employer], fall prevention, cane use.  Plan: Patient agrees to discharge.  Patient goals were partially met. Patient is being discharged due to being pleased with the current functional level.  ?????      [facility] [address] [place], [place], [address] Phone: [phone]   Fax:  [phone]   Mady Haagensen, [place] 08-18-16 6:35 PM Phone: [phone] Fax: [phone]   Name: Summer Holloway MRN: [id number] Date of Birth: Jul 27, 1950

## 2016-08-06 NOTE — Patient Instructions (Addendum)

## 2016-08-12 ENCOUNTER — Ambulatory Visit: Payer: Medicare Other | Admitting: Physical Therapy

## 2016-08-13 ENCOUNTER — Ambulatory Visit: Payer: Medicare Other | Admitting: Physical Therapy

## 2016-08-14 ENCOUNTER — Telehealth: Payer: Self-pay

## 2016-08-14 NOTE — Telephone Encounter (Signed)
NOTES SENT TO [facility]

## 2016-08-29 ENCOUNTER — Encounter: Payer: Self-pay | Admitting: Emergency Medicine

## 2016-08-29 DIAGNOSIS — W01198A Fall on same level from slipping, tripping and stumbling with subsequent striking against other object, initial encounter: Secondary | ICD-10-CM | POA: Diagnosis not present

## 2016-08-29 DIAGNOSIS — W548XXA Other contact with dog, initial encounter: Secondary | ICD-10-CM | POA: Diagnosis not present

## 2016-08-29 DIAGNOSIS — Z23 Encounter for immunization: Secondary | ICD-10-CM | POA: Diagnosis not present

## 2016-08-29 DIAGNOSIS — S59911A Unspecified injury of right forearm, initial encounter: Secondary | ICD-10-CM | POA: Diagnosis present

## 2016-08-29 DIAGNOSIS — I10 Essential (primary) hypertension: Secondary | ICD-10-CM | POA: Insufficient documentation

## 2016-08-29 DIAGNOSIS — Y93K9 Activity, other involving animal care: Secondary | ICD-10-CM | POA: Diagnosis not present

## 2016-08-29 DIAGNOSIS — Z79899 Other long term (current) drug therapy: Secondary | ICD-10-CM | POA: Insufficient documentation

## 2016-08-29 DIAGNOSIS — S51811A Laceration without foreign body of right forearm, initial encounter: Secondary | ICD-10-CM | POA: Insufficient documentation

## 2016-08-29 DIAGNOSIS — Y929 Unspecified place or not applicable: Secondary | ICD-10-CM | POA: Insufficient documentation

## 2016-08-29 DIAGNOSIS — Y999 Unspecified external cause status: Secondary | ICD-10-CM | POA: Diagnosis not present

## 2016-08-29 NOTE — ED Triage Notes (Signed)
Pt ambulatory to triage with steady gait, no distress noted. Pt reports she kicked at a dog, fell onto brick step, landing onto the right arm. Pt has an approximately 4 inch laceration to the right forearm. Bleeding controled at this time.

## 2016-08-30 ENCOUNTER — Emergency Department
Admission: EM | Admit: 2016-08-30 | Discharge: 2016-08-30 | Disposition: A | Discharge status: Home / Self Care | Payer: Medicare Other | Attending: Emergency Medicine | Admitting: Emergency Medicine

## 2016-08-30 DIAGNOSIS — S51811A Laceration without foreign body of right forearm, initial encounter: Secondary | ICD-10-CM

## 2016-08-30 MED ORDER — BACITRACIN ZINC 500 UNIT/GM EX OINT
TOPICAL_OINTMENT | Freq: Once | CUTANEOUS | Status: AC
  Administered 2016-08-30: 1 via TOPICAL
  Filled 2016-08-30: qty 0.9

## 2016-08-30 MED ORDER — TETANUS-DIPHTH-ACELL PERTUSSIS 5-2.5-18.5 LF-MCG/0.5 IM SUSP: 0.5000 mL | Freq: Once | INTRAMUSCULAR | Status: DC

## 2016-08-30 MED ORDER — TETANUS-DIPHTH-ACELL PERTUSSIS 5-2.5-18.5 LF-MCG/0.5 IM SUSP
0.5000 mL | Freq: Once | INTRAMUSCULAR | Status: AC
  Administered 2016-08-30: 0.5 mL via INTRAMUSCULAR
  Filled 2016-08-30: qty 0.5

## 2016-08-30 MED ORDER — LIDOCAINE HCL (PF) 1 % IJ SOLN
INTRAMUSCULAR | Status: AC
  Administered 2016-08-30: 30 mL via INTRADERMAL
  Filled 2016-08-30: qty 10

## 2016-08-30 MED ORDER — LIDOCAINE HCL (PF) 1 % IJ SOLN
30.0000 mL | Freq: Once | INTRAMUSCULAR | Status: AC
  Administered 2016-08-30: 30 mL via INTRADERMAL
  Filled 2016-08-30: qty 30

## 2016-08-30 NOTE — ED Notes (Signed)
Lac cart at bedside  ?

## 2016-08-30 NOTE — ED Notes (Signed)
MD Goodman at bedside. 

## 2016-08-30 NOTE — ED Notes (Signed)
Sterile dressing and bacitracin ointment applied to patient right arm per MD order by Eileen Stanford, RN

## 2016-08-30 NOTE — ED Provider Notes (Signed)
[facility] Emergency Department Provider Note  ____________________________________________   I have reviewed the triage vital signs and the nursing notes.   HISTORY  Chief Complaint Laceration   History limited by: Not Limited   HPI Summer Holloway is a 66 y.o. female who presents to the emergency department today because of concerns for laceration. This located on her right forearm. Happened this evening. The patient states she went out with her inside dog. She tried to stop her outdoor dogs from coming to her with her leg. She lost her balance and fell backwards. She hit her forearm against some bricks. She denies hitting her head or loss of consciousness. She denies any other injury. She is not sure when her last tetanus shot would've been.   Past Medical History:  Diagnosis Date  . Anxiety   . Bursitis of left hip   . Depression   . Esophageal reflux   . Fibromyalgia   . Hearing loss   . Hyperlipemia   . Hyperlipidemia   . Hypertension   . Osteoarthritis   . PVC (premature ventricular contraction)   . Sciatica   . Tinnitus     There are no active problems to display for this patient.   Past Surgical History:  Procedure Laterality Date  . BREAST BIOPSY  05/24/2002  . CATARACT EXTRACTION, BILATERAL    . PARTIAL HYSTERECTOMY      Prior to Admission medications   Medication Sig Start Date End Date Taking? Authorizing Provider  ALPRAZolam (XANAX) 0.25 MG tablet Take 1 tablet (0.25 mg total) by mouth daily as needed for anxiety. 1-2 tablets at onset of panic. 02/06/16   Dohmeier, Porfirio Mylar, MD  amitriptyline (ELAVIL) 50 MG tablet Take 25 mg by mouth at bedtime.    [provider]  amLODipine (NORVASC) 5 MG tablet Take 5 mg by mouth daily.    [provider]  buPROPion (WELLBUTRIN XL) 150 MG 24 hr tablet Take 150 mg by mouth daily.    [provider]  Dimethyl Fumarate (TECFIDERA) 120 & 240 MG MISC Titration starter:  120mg  PO BID x7 days. 240mg  PO BID thereafter. Take after a meal. 03/06/16   Dohmeier, Porfirio Mylar, MD  DULoxetine (CYMBALTA) 60 MG capsule Take 60 mg by mouth daily.    [provider]  escitalopram (LEXAPRO) 10 MG tablet Take 10 mg by mouth 2 (two) times daily.    [provider]  estradiol (ESTRACE) 1 MG tablet Take 0.5 mg by mouth daily.  11/16/15   [provider]  FLUoxetine (PROZAC) 40 MG capsule Take 40 mg by mouth daily.    [provider]  HYDROcodone-acetaminophen (NORCO) 10-325 MG tablet Take 1 tablet by mouth every 12 (twelve) hours as needed.    [provider]  levothyroxine (SYNTHROID, LEVOTHROID) 50 MCG tablet Take 50 mcg by mouth daily before breakfast.    [provider]  pantoprazole (PROTONIX) 40 MG tablet Take 40 mg by mouth daily.    [provider]  pregabalin (LYRICA) 100 MG capsule Take 100 mg by mouth. 2 capsules in am and 1 capsule in pm     [provider]  propranolol (INDERAL) 40 MG tablet Take 20 mg by mouth 2 (two) times daily.    [provider]  rosuvastatin (CRESTOR) 5 MG tablet Take 5 mg by mouth daily.    [provider]    Allergies Patient has no known allergies.  Family History  Problem Relation Age of  Onset  . Fibromyalgia Mother   . Heart attack Father   . CAD Father     Social History Social History  Substance Use Topics  . Smoking status: Never Smoker  . Smokeless tobacco: Never Used  . Alcohol use No    Review of Systems Constitutional: No fever/chills Eyes: No visual changes. ENT: No sore throat. Cardiovascular: Denies chest pain. Respiratory: Denies shortness of breath. Gastrointestinal: No abdominal pain.  No nausea, no vomiting.  No diarrhea.   Genitourinary: Negative for dysuria. Musculoskeletal: Negative for back pain. Skin: positive for laceration to the right arm Neurological: Negative for headaches, focal weakness or  numbness.  ____________________________________________   PHYSICAL EXAM:  VITAL SIGNS: ED Triage Vitals  Enc Vitals Group     BP 08/29/16 2219 (!) 149/79     Pulse Rate 08/29/16 2219 70     Resp 08/29/16 2219 16     Temp 08/29/16 2219 (!) 97 F (36.1 C)     Temp Source 08/29/16 2219 Oral     SpO2 08/29/16 2219 96 %     Weight 08/29/16 2220 175 lb (79.4 kg)   Constitutional: Alert and oriented. Well appearing and in no distress. Eyes: Conjunctivae are normal.  ENT   Head: Normocephalic and atraumatic.   Nose: No congestion/rhinnorhea.   Mouth/Throat: Mucous membranes are moist.   Neck: No stridor. Cardiovascular: Normal rate, regular rhythm.  No murmurs, rubs, or gallops.  Respiratory: Normal respiratory effort without tachypnea nor retractions. Breath sounds are clear and equal bilaterally. No wheezes/rales/rhonchi. Gastrointestinal: Soft and non tender. No rebound. No guarding.  Genitourinary: Deferred Musculoskeletal: Normal range of motion in all extremities.  Neurologic:  Normal speech and language. No gross focal neurologic deficits are appreciated.  Skin:  Laceration to the right forearm. Roughly 4 cm. Psychiatric: Mood and affect are normal. Speech and behavior are normal. Patient exhibits appropriate insight and judgment.  ____________________________________________    LABS (pertinent positives/negatives)  None  ____________________________________________   EKG  None  ____________________________________________    RADIOLOGY  None   ____________________________________________   PROCEDURES  Procedures  LACERATION REPAIR Performed by: Phineas Semen Authorized by: Phineas Semen Consent: Verbal consent obtained. Risks and benefits: risks, benefits and alternatives were discussed Consent given by: patient Patient identity confirmed: provided demographic data Prepped and Draped in normal sterile fashion Wound  explored  Laceration Location: right forearm  Laceration Length: 4 cm  No Foreign Bodies seen or palpated  Anesthesia: local infiltration  Local anesthetic: lidocaine 1% without epinephrine  Anesthetic total: 3 ml  Irrigation method: syringe Amount of cleaning: standard  Skin closure: 4-0 vicryl rapide  Number of sutures: 11  Technique: simple interrupted  Patient tolerance: Patient tolerated the procedure well with no immediate complications.  ____________________________________________   INITIAL IMPRESSION / ASSESSMENT AND PLAN / ED COURSE  Pertinent labs & imaging results that were available during my care of the patient were reviewed by me and considered in my medical decision making (see chart for details).  Patient presents to the emergency department today because of concerns for right forearm laceration. This was sutured closed. Patient will have her tetanus updated.  ____________________________________________   FINAL CLINICAL IMPRESSION(S) / ED DIAGNOSES  Final diagnoses:  Laceration of right forearm, initial encounter     Note: This dictation was prepared with Dragon dictation. Any transcriptional errors that result from this process are unintentional     Phineas Semen, MD 08/30/16 [phone]

## 2016-08-30 NOTE — Discharge Instructions (Signed)
Please seek medical attention for any high fevers, chest pain, shortness of breath, change in behavior, persistent vomiting, bloody stool or any other new or concerning symptoms.  

## 2016-09-19 ENCOUNTER — Ambulatory Visit: Payer: Medicare Other | Admitting: Internal Medicine

## 2016-09-22 ENCOUNTER — Ambulatory Visit (INDEPENDENT_AMBULATORY_CARE_PROVIDER_SITE_OTHER): Payer: Medicare Other | Admitting: Adult Health

## 2016-09-22 ENCOUNTER — Encounter: Payer: Self-pay | Admitting: Adult Health

## 2016-09-22 VITALS — BP 138/86 | HR 77 | Wt 176.2 lb

## 2016-09-22 DIAGNOSIS — Z5181 Encounter for therapeutic drug level monitoring: Secondary | ICD-10-CM

## 2016-09-22 DIAGNOSIS — R200 Anesthesia of skin: Secondary | ICD-10-CM

## 2016-09-22 DIAGNOSIS — G35 Multiple sclerosis: Secondary | ICD-10-CM

## 2016-09-22 DIAGNOSIS — H539 Unspecified visual disturbance: Secondary | ICD-10-CM

## 2016-09-22 NOTE — Progress Notes (Signed)
PATIENT: Summer Holloway DOB: [birth year]  REASON FOR VISIT: follow up- multiple sclerosis HISTORY FROM: patient  HISTORY OF PRESENT ILLNESS: Today 09/22/16 Summer Holloway is a 66 year old female with a history of multiple sclerosis. She returns today for follow-up. She is currently on Medicare and tolerating it well. She reports that she started this March. She reports that she did notice after starting medication she developed numbness in the feet bilaterally. She states that she has ongoing generalized weakness but denies any new weakness. She reports that she is having incontinence of bowels for approximately 2 months. She states this does not occur with every bowel movement but does happen frequently. She states that she has noticed blurry vision and diplopia that started approximately 3 months ago. Reports that her mood is stable. Reports that she sleeps okay. Reports that her balance has been off. She had a fall approximately 3 weeks ago and suffered a laceration to the left arm. She did not start any new medication. She returns today for an evaluation.  HISTORY 05/27/16 Copied from Summer Holloway notes: Summer Holloway is seen here today in the presence of her husband and was referred her primary care physician Dr. Kevan Ny for evaluation of disequilibrium, unsteady gait, possible ataxia. She carries a diagnosis of weight gain, hypertension, she also has been followed by Dr. Kevan Ny for fibromyalgia, insomnia and has been treated for depression. She has been taking Lyrica and amitriptyline.  The chronic disequilibrium begun according to Dr. Kevan Ny notes about  5 years ago " unsteady  " while  the patient walks or is in motion. Sometimes she has have to hold onto her husband's arm to steady her gait. She feels as it is being pulled into a different direction, cannot walk a straight line. She jokingly reports having no chance to pass a drunk driving test. She is only experiencing this pulling when in motion  and not at rest. She does not describe vertigo. When anxious she is much more off balance!  She is pleasant and conversant, reports that she is afraid of falling. She has not been evaluated by ENT. She does have hearing loss and a chronic tinnitus  ( a constant high pitches sound ) for 35 years.  She had no MRI of the brain and has no family history of schwannoma.Maternal aunt clarabelle had ataxia, vertigo and auditory hallucinations. Mother had anxiety.  Social history:  Married, non smoker, non ETOH, use, caffeine , none. Retired, Comptroller.    REVIEW OF SYSTEMS: Out of a complete 14 system review of symptoms, the patient complains only of the following symptoms, and all other reviewed systems are negative.  Fatigue, excessive sweating, ringing in ears, light sensitivity, double vision, blurred vision, shortness of breath, chest tightness, palpitations, heat intolerance, excessive eating, incontinence of bowels, daytime sleepiness, snoring, joint pain, aching muscles, walking difficulty, neck stiffness, decreased concentration, depression, weakness, tremors, memory loss, dizziness,  ALLERGIES: No Known Allergies  HOME MEDICATIONS: Outpatient Medications Prior to Visit  Medication Sig Dispense Refill  . ALPRAZolam (XANAX) 0.25 MG tablet Take 1 tablet (0.25 mg total) by mouth daily as needed for anxiety. 1-2 tablets at onset of panic. 30 tablet 0  . amitriptyline (ELAVIL) 50 MG tablet Take 25 mg by mouth at bedtime.    Marland Kitchen amLODipine (NORVASC) 5 MG tablet Take 5 mg by mouth daily.    Marland Kitchen buPROPion (WELLBUTRIN XL) 150 MG 24 hr tablet Take 150 mg by mouth daily.    . Dimethyl Fumarate (  TECFIDERA) 120 & 240 MG MISC Titration starter:  PO BID x7 days.  PO BID thereafter. Take after a meal. 180 each 0  . DULoxetine (CYMBALTA) 60 MG capsule Take 60 mg by mouth daily.    Marland Kitchen escitalopram (LEXAPRO) 10 MG tablet Take 10 mg by mouth 2 (two) times daily.    Marland Kitchen estradiol (ESTRACE) 1 MG tablet Take 0.5  mg by mouth daily.   3  . FLUoxetine (PROZAC) 40 MG capsule Take 40 mg by mouth daily.    Marland Kitchen HYDROcodone-acetaminophen (NORCO) 10-325 MG tablet Take 1 tablet by mouth every 12 (twelve) hours as needed.    Marland Kitchen levothyroxine (SYNTHROID, LEVOTHROID) 50 MCG tablet Take 50 mcg by mouth daily before breakfast.    . pantoprazole (PROTONIX) 40 MG tablet Take 40 mg by mouth daily.    . pregabalin (LYRICA) 100 MG capsule Take 100 mg by mouth. 2 capsules in am and 1 capsule in pm     . propranolol (INDERAL) 40 MG tablet Take 20 mg by mouth 2 (two) times daily.    . rosuvastatin (CRESTOR) 5 MG tablet Take 5 mg by mouth daily.     No facility-administered medications prior to visit.     PAST MEDICAL HISTORY: Past Medical History:  Diagnosis Date  . Anxiety   . Bursitis of left hip   . Depression   . Esophageal reflux   . Fibromyalgia   . Hearing loss   . Hyperlipemia   . Hyperlipidemia   . Hypertension   . Multiple sclerosis (HCC)   . Osteoarthritis   . PVC (premature ventricular contraction)   . Sciatica   . Tinnitus     PAST SURGICAL HISTORY: Past Surgical History:  Procedure Laterality Date  . BREAST BIOPSY  05/24/2002  . CATARACT EXTRACTION, BILATERAL    . PARTIAL HYSTERECTOMY      FAMILY HISTORY: Family History  Problem Relation Age of Onset  . Fibromyalgia Mother   . Heart attack Father   . CAD Father     SOCIAL HISTORY: Social History   Social History  . Marital status: Married    Spouse name: Deniece Portela  . Number of children: N/A  . Years of education: N/A   Occupational History  . Not on file.   Social History Main Topics  . Smoking status: Never Smoker  . Smokeless tobacco: Never Used  . Alcohol use No  . Drug use: Unknown  . Sexual activity: Not on file   Other Topics Concern  . Not on file   Social History Narrative   Lives with husband      PHYSICAL EXAM  Vitals:   09/22/16 1454  BP: 138/86  Pulse: 77  Weight: 176 lb 3.2 oz (79.9 kg)   Body  mass index is 27.6 kg/m.  Generalized: Well developed, in no acute distress   Neurological examination  Mentation: Alert oriented to time, place, history taking. Follows all commands speech and language fluent Cranial nerve II-XII: Pupils were equal round reactive to light. Extraocular movements were full, visual field were full on confrontational test. Facial sensation and strength were normal. Uvula tongue midline. Head turning and shoulder shrug  were normal and symmetric. Motor: The motor testing reveals 5 over 5 strength of all 4 extremities. Good symmetric motor tone is noted throughout.  Sensory: Sensory testing is intact to soft touch on all 4 extremities. No evidence of extinction is noted.  Coordination: Cerebellar testing reveals good finger-nose-finger and heel-to-shin bilaterally.  Gait  and station: Gait is normal. Tandem gait is normal. Romberg is negative. No drift is seen.  Reflexes: Deep tendon reflexes are symmetric and normal bilaterally.   DIAGNOSTIC DATA (LABS, IMAGING, TESTING) - I reviewed patient records, labs, notes, testing and imaging myself where available.  Lab Results  Component Value Date   WBC 5.9 05/27/2016   HGB 12.7 05/27/2016   HCT 37.6 05/27/2016   MCV 93 05/27/2016   PLT 383 (H) 05/27/2016      Component Value Date/Time   NA 145 (H) 05/27/2016 1137   K 5.0 05/27/2016 1137   CL 104 05/27/2016 1137   CO2 27 05/27/2016 1137   GLUCOSE 102 (H) 05/27/2016 1137   BUN 12 05/27/2016 1137   CREATININE 0.79 05/27/2016 1137   CALCIUM 9.3 05/27/2016 1137   PROT 6.8 05/27/2016 1137   ALBUMIN 4.1 05/27/2016 1137   AST 23 05/27/2016 1137   ALT 9 05/27/2016 1137   ALKPHOS 130 (H) 05/27/2016 1137   BILITOT <0.2 05/27/2016 1137   GFRNONAA 78 05/27/2016 1137   GFRAA 90 05/27/2016 1137      ASSESSMENT AND PLAN 66 y.o. year old female  has a past medical history of Anxiety; Bursitis of left hip; Depression; Esophageal reflux; Fibromyalgia; Hearing loss;  Hyperlipemia; Hyperlipidemia; Hypertension; Multiple sclerosis (HCC); Osteoarthritis; PVC (premature ventricular contraction); Sciatica; and Tinnitus. here with :  1. Multiple sclerosis 2. Numbness feet bilaterally 3. Vision changes  The patient's physical exam is relatively unremarkable. She will continue Tecfidera. She is reporting some new symptoms that has occurred over the last 3-4 months. For that reason I will repeat an MRI of the brain and I'll also order a MRI of the cervical spine to look for any acute changes. I will also check blood work today. She is advised that if her symptoms worsen or she develops new symptoms she should let us know. She will follow-up in 6 months or sooner if needed.     Butch Penny, MSN, NP-C 09/22/2016, 3:17 PM [facility] [address] [place], [place] [address] [phone]

## 2016-09-23 LAB — COMPREHENSIVE METABOLIC PANEL
ALBUMIN: 4.2 g/dL (ref 3.6–4.8)
ALT: 16 IU/L (ref 0–32)
AST: 28 IU/L (ref 0–40)
Albumin/Globulin Ratio: 1.4 (ref 1.2–2.2)
Alkaline Phosphatase: 121 IU/L — ABNORMAL HIGH (ref 39–117)
BUN/Creatinine Ratio: 19 (ref 12–28)
BUN: 15 mg/dL (ref 8–27)
Bilirubin Total: <0.2 mg/dL (ref 0.0–1.2)
CALCIUM: 9.2 mg/dL (ref 8.7–10.3)
CO2: 27 mmol/L (ref 20–29)
CREATININE: 0.79 mg/dL (ref 0.57–1.00)
Chloride: 101 mmol/L (ref 96–106)
GFR, EST AFRICAN AMERICAN: 90 mL/min/{1.73_m2} (ref >59)
GFR, EST NON AFRICAN AMERICAN: 78 mL/min/{1.73_m2} (ref >59)
GLUCOSE: 87 mg/dL (ref 65–99)
Globulin, Total: 2.9 g/dL (ref 1.5–4.5)
Potassium: 4.8 mmol/L (ref 3.5–5.2)
Sodium: 141 mmol/L (ref 134–144)
TOTAL PROTEIN: 7.1 g/dL (ref 6.0–8.5)

## 2016-09-23 LAB — CBC WITH DIFFERENTIAL/PLATELET
BASOS ABS: 0.1 10*3/uL (ref 0.0–0.2)
BASOS: 1 %
EOS (ABSOLUTE): 0.1 10*3/uL (ref 0.0–0.4)
EOS: 1 %
HEMATOCRIT: 39.1 % (ref 34.0–46.6)
HEMOGLOBIN: 13.5 g/dL (ref 11.1–15.9)
Immature Grans (Abs): 0 10*3/uL (ref 0.0–0.1)
Immature Granulocytes: 0 %
LYMPHS: 22 %
Lymphocytes Absolute: 1.5 10*3/uL (ref 0.7–3.1)
MCH: 32 pg (ref 26.6–33.0)
MCHC: 34.5 g/dL (ref 31.5–35.7)
MCV: 93 fL (ref 79–97)
MONOCYTES: 10 %
Monocytes Absolute: 0.7 10*3/uL (ref 0.1–0.9)
Neutrophils Absolute: 4.6 10*3/uL (ref 1.4–7.0)
Neutrophils: 66 %
Platelets: 393 10*3/uL — ABNORMAL HIGH (ref 150–379)
RBC: 4.22 x10E6/uL (ref 3.77–5.28)
RDW: 15 % (ref 12.3–15.4)
WBC: 7 10*3/uL (ref 3.4–10.8)

## 2016-09-23 NOTE — Progress Notes (Signed)
I agree with the assessment and plan as directed by NP .The patient is known to me .   Samrat Hayward, MD  

## 2016-09-24 ENCOUNTER — Telehealth: Payer: Self-pay | Admitting: *Deleted

## 2016-09-24 NOTE — Telephone Encounter (Signed)
Spoke with patient and advised her that her blood work is relatively unremarkable, and it is consistent with her previous blood work. Patient questioned if she still needs to have MRIs. This RN advised she does because she reported new symptoms in the past few months. Advised her the NP wants to be sure her brain and neck areas are fine. She verbalized understanding, appreciation, had no further questions.

## 2016-10-06 ENCOUNTER — Ambulatory Visit (INDEPENDENT_AMBULATORY_CARE_PROVIDER_SITE_OTHER): Payer: Medicare Other | Admitting: Internal Medicine

## 2016-10-06 ENCOUNTER — Encounter: Payer: Self-pay | Admitting: Internal Medicine

## 2016-10-06 ENCOUNTER — Telehealth (HOSPITAL_COMMUNITY): Payer: Self-pay | Admitting: *Deleted

## 2016-10-06 VITALS — BP 132/82 | HR 78 | Ht 67.0 in | Wt 176.2 lb

## 2016-10-06 DIAGNOSIS — R5383 Other fatigue: Secondary | ICD-10-CM | POA: Diagnosis not present

## 2016-10-06 DIAGNOSIS — R079 Chest pain, unspecified: Secondary | ICD-10-CM

## 2016-10-06 DIAGNOSIS — I10 Essential (primary) hypertension: Secondary | ICD-10-CM

## 2016-10-06 DIAGNOSIS — E782 Mixed hyperlipidemia: Secondary | ICD-10-CM | POA: Diagnosis not present

## 2016-10-06 NOTE — Telephone Encounter (Signed)
Patient given detailed instructions per Myocardial Perfusion Study Information Sheet for the test on 10/07/16. Patient notified to arrive 15 minutes early and that it is imperative to arrive on time for appointment to keep from having the test rescheduled.  If you need to cancel or reschedule your appointment, please call the office within 24 hours of your appointment. . Patient verbalized understanding. Summer Holloway Jacqueline    

## 2016-10-06 NOTE — Patient Instructions (Signed)
Medication Instructions:  Start aspirin 81 mg daily.  Labwork: None   Testing/Procedures: Your physician has requested that you have en exercise stress myoview. For further information please visit [email or web address] Please follow instruction sheet, as given.    Follow-Up: Your physician recommends that you schedule a follow-up appointment in: 4-6 weeks with Dr End.         If you need a refill on your cardiac medications before your next appointment, please call your pharmacy.

## 2016-10-06 NOTE — Progress Notes (Signed)
New Outpatient Visit Date: 10/06/2016  Referring Provider: Levonne Lapping, NP [address] [place], [place] [address]  Chief Complaint: Chest pain  HPI:  Summer Holloway is a 66 y.o. female who is being seen today for the evaluation of chest pain at the request of Levonne Lapping, NP. She has a history of hypertension, hyperlipidemia, GERD, multiple sclerosis, and fibromyalgia. Last December, Summer Holloway began experiencing tightness across the upper chest radiating to her shoulder blades while walking with her cousin. Her cousin had similar symptoms and suffered a heart attack shortly thereafter. Summer Holloway has continued to have bilateral chest tightness with modest activity with accompanying shortness of breath. The symptoms stop in 20 seconds or less after resting. She also notes some diaphoresis throughout the day as well as generalized fatigue.  Summer Holloway has a long history of palpitations and PVCs for which she takes propranolol. She was previously evaluated by Dr. Patty Sermons, having last been seen in 2014. Summer Holloway denies lightheadedness, orthopnea, PND, and edema. She notes considerable daytime fatigue, often needing to nap in the afternoons. Her husband reports that Summer Holloway snores at night and occasionally has apneic spells. She has never undergone a sleep study. She reports a stress test many years ago, which she believes was negative. She does not consume any caffeine.  --------------------------------------------------------------------------------------------------  Cardiovascular History & Procedures: Cardiovascular Problems:  Accelerating angina  Palpitations and PVCs  Risk Factors:  Hypertension, hyperlipidemia, and age > 52  Cath/PCI:  None  CV Surgery:  None  EP Procedures and Devices:  None  Non-Invasive Evaluation(s):  Stress test: ~10 years ago; negative per patient's report  Recent CV Pertinent Labs: Lab Results  Component Value Date   K 4.8  09/22/2016   BUN 15 09/22/2016   CREATININE 0.79 09/22/2016    --------------------------------------------------------------------------------------------------  Past Medical History:  Diagnosis Date  . Anxiety   . Bursitis of left hip   . Depression   . Esophageal reflux   . Fibromyalgia   . Hearing loss   . Hyperlipemia   . Hyperlipidemia   . Hypertension   . Multiple sclerosis (HCC)   . Osteoarthritis   . PVC (premature ventricular contraction)   . Sciatica   . Tinnitus     Past Surgical History:  Procedure Laterality Date  . BREAST BIOPSY  05/24/2002  . CATARACT EXTRACTION, BILATERAL    . PARTIAL HYSTERECTOMY      Current Meds  Medication Sig  . ALPRAZolam (XANAX) 0.25 MG tablet Take 1 tablet (0.25 mg total) by mouth daily as needed for anxiety. 1-2 tablets at onset of panic.  Marland Kitchen amitriptyline (ELAVIL) 50 MG tablet Take 25 mg by mouth at bedtime.  Marland Kitchen amLODipine (NORVASC) 5 MG tablet Take 5 mg by mouth daily.  Marland Kitchen buPROPion (WELLBUTRIN XL) 150 MG 24 hr tablet Take 150 mg by mouth daily.  . Dimethyl Fumarate (TECFIDERA) 120 & 240 MG MISC Titration starter:  PO BID x7 days.  PO BID thereafter. Take after a meal.  . DULoxetine (CYMBALTA) 60 MG capsule Take 60 mg by mouth daily.  Marland Kitchen escitalopram (LEXAPRO) 10 MG tablet Take 10 mg by mouth 2 (two) times daily.  Marland Kitchen estradiol (ESTRACE) 1 MG tablet Take 0.5 mg by mouth daily.   Marland Kitchen FLUoxetine (PROZAC) 40 MG capsule Take 40 mg by mouth daily.  Marland Kitchen HYDROcodone-acetaminophen (NORCO) 10-325 MG tablet Take 1 tablet by mouth every 12 (twelve) hours as needed.  Marland Kitchen levothyroxine (SYNTHROID, LEVOTHROID) 50 MCG tablet  Take 50 mcg by mouth daily before breakfast.  . pantoprazole (PROTONIX) 40 MG tablet Take 40 mg by mouth daily.  . pregabalin (LYRICA) 100 MG capsule Take 100 mg by mouth. 2 capsules in am and 1 capsule in pm   . propranolol (INDERAL) 40 MG tablet Take 20 mg by mouth 2 (two) times daily.  . rosuvastatin (CRESTOR) 5 MG  tablet Take 5 mg by mouth daily.    Allergies: Patient has no known allergies.  Social History   Social History  . Marital status: Married    Spouse name: Deniece Portela  . Number of children: N/A  . Years of education: N/A   Occupational History  . Not on file.   Social History Main Topics  . Smoking status: Never Smoker  . Smokeless tobacco: Never Used  . Alcohol use No  . Drug use: Unknown  . Sexual activity: Not on file   Other Topics Concern  . Not on file   Social History Narrative   Lives with husband    Family History  Problem Relation Age of Onset  . Fibromyalgia Mother   . Anxiety disorder Mother   . Heart attack Father 58  . CAD Father   . Heart attack Cousin     Review of Systems: Review of Systems  Constitutional: Positive for diaphoresis and malaise/fatigue.  HENT: Positive for hearing loss.   Eyes: Positive for blurred vision.  Respiratory: Positive for shortness of breath. Negative for stridor (snoring).   Cardiovascular: Positive for chest pain and palpitations.  Gastrointestinal: Negative.   Genitourinary: Negative.   Musculoskeletal: Positive for back pain and myalgias.  Skin: Negative.   Neurological: Positive for headaches.  Endo/Heme/Allergies: Negative.   Psychiatric/Behavioral: Positive for depression. The patient is nervous/anxious.    --------------------------------------------------------------------------------------------------  Physical Exam: BP 132/82   Pulse 78   Ht 5\' 7"  (1.702 m)   Wt 176 lb 3.2 oz (79.9 kg)   SpO2 95%   BMI 27.60 kg/m   General:  Well-developed, well-nourished woman, seated comfortably in the exam room. She is accompanied by her husband. HEENT: No conjunctival pallor or scleral icterus. Moist mucous membranes. OP clear. Neck: Supple without lymphadenopathy, thyromegaly, JVD, or HJR. No carotid bruit. Lungs: Normal work of breathing. Clear to auscultation bilaterally without wheezes or crackles. Heart:  Regular rate and rhythm without murmurs, rubs, or gallops. Non-displaced PMI. Abd: Bowel sounds present. Soft, NT/ND without hepatosplenomegaly Ext: No lower extremity edema. Radial, PT, and DP pulses are 2+ bilaterally Skin: Warm and dry without rash. Neuro: CNIII-XII intact. Strength and fine-touch sensation intact in upper and lower extremities bilaterally. Psych: Normal mood and affect.  EKG:  NSR without abnormalities.  Lab Results  Component Value Date   WBC 7.0 09/22/2016   HGB 13.5 09/22/2016   HCT 39.1 09/22/2016   MCV 93 09/22/2016   PLT 393 (H) 09/22/2016    Lab Results  Component Value Date   NA 141 09/22/2016   K 4.8 09/22/2016   CL 101 09/22/2016   CO2 27 09/22/2016   BUN 15 09/22/2016   CREATININE 0.79 09/22/2016   GLUCOSE 87 09/22/2016   ALT 16 09/22/2016    No results found for: CHOL, HDL, LDLCALC, LDLDIRECT, TRIG, CHOLHDL  Outside labs: Lipid panel (01/2016): Total cholesterol 211, HDL 61, LDL 122, triglycerides 143  --------------------------------------------------------------------------------------------------  ASSESSMENT AND PLAN: Chest pain Summer Holloway reports chest pain for at least 9 month, which has gradually worsened. It is present with modest activity but not  rest, consistent with CCS class II-III angina. Risk factors including hypertension, hyperlipidemia, and age; she has a family history of CAD but not premature. EKG today is normal. We have discussed further evaluation options and have agreed to obtain an exercise myocardial perfusion stress test. I am concerned about her ability to exercise, given her MS; the study can be converted to regadenoson if she [place] unable to reach her target heart rate. In the meantime, I have asked Summer Holloway to begin taking aspirin 81 mg daily and to continue her current dose of propranolol.  Fatigue This is likely multifactorial. Summer Holloway is scheduled to see her neurologist in the near future. I encouraged her to  speak with Dr. Vickey Huger about the utility of a sleep study.  Hypertension Blood pressure is upper normal today. No medication changes at this time.  Hyperlipidemia Most recent LDL was 122 in January. We will continue with low-dose rosuvastatin pending stress test results.  Follow-up: Return to clinic in 4-6 weeks.  Yvonne Kendall, MD 10/06/2016 10:27 AM

## 2016-10-07 ENCOUNTER — Ambulatory Visit (HOSPITAL_COMMUNITY): Payer: Medicare Other | Attending: Cardiology

## 2016-10-07 DIAGNOSIS — R079 Chest pain, unspecified: Secondary | ICD-10-CM | POA: Diagnosis not present

## 2016-10-07 LAB — MYOCARDIAL PERFUSION IMAGING
CHL CUP NUCLEAR SSS: 5
CHL CUP RESTING HR STRESS: 75 {beats}/min
CSEPED: 6 min
CSEPEDS: 11 s
CSEPEW: 7.2 METS
LV dias vol: 73 mL (ref 46–106)
LV sys vol: 25 mL
MPHR: 154 {beats}/min
Peak HR: 144 {beats}/min
Percent HR: 93 %
RATE: 0.37
SDS: 4
SRS: 1
TID: 0.91

## 2016-10-07 MED ORDER — TECHNETIUM TC 99M TETROFOSMIN IV KIT
31.9000 | PACK | Freq: Once | INTRAVENOUS | Status: AC | PRN
  Administered 2016-10-07: 31.9 via INTRAVENOUS
  Filled 2016-10-07: qty 32

## 2016-10-07 MED ORDER — TECHNETIUM TC 99M TETROFOSMIN IV KIT
10.1000 | PACK | Freq: Once | INTRAVENOUS | Status: AC | PRN
Start: 2016-10-07 — End: 2016-10-07
  Administered 2016-10-07: 10.1 via INTRAVENOUS
  Filled 2016-10-07: qty 11

## 2016-10-07 NOTE — Addendum Note (Signed)
Addended by: Micki Riley C on: 10/07/2016 09:03 AM   Modules accepted: Orders

## 2016-10-07 NOTE — Progress Notes (Signed)
I agree with the assessment and plan as directed by MD .The patient is known to me.    Delbert Vu, MD  

## 2016-10-08 ENCOUNTER — Ambulatory Visit
Admission: RE | Admit: 2016-10-08 | Discharge: 2016-10-08 | Disposition: A | Discharge status: Home / Self Care | Payer: Medicare Other | Source: Ambulatory Visit | Attending: Adult Health | Admitting: Adult Health

## 2016-10-08 ENCOUNTER — Telehealth: Payer: Self-pay

## 2016-10-08 DIAGNOSIS — G35 Multiple sclerosis: Secondary | ICD-10-CM

## 2016-10-08 MED ORDER — CARVEDILOL 6.25 MG PO TABS: 6.2500 mg | ORAL_TABLET | Freq: Two times a day (BID) | ORAL | 11 refills | Status: DC

## 2016-10-08 NOTE — Telephone Encounter (Signed)
-----   Message from Yvonne Kendall, MD sent at 10/08/2016  7:47 AM EDT ----- Please let Summer Holloway know that her stress test is normal without evidence of a blockage. Her heart also appears to be contracting vigorously. We could try switching her from propranolol to carvedilol 6.25 mg BID, to see if this helps her fatigue. She should also speak with Dr. Vickey Huger about a sleep study at her upcoming appointment. We will f/u as planned in 4-6 weeks to reassess her symptoms.

## 2016-10-08 NOTE — Telephone Encounter (Signed)
Called and spoke to patient and made her aware of her results and Dr. Laurelyn Sickle recommendations to stop propanolol and start carvedilol 6.25 mg BID to see if this helps with her fatigue. Rx sent to patient's preferred pharmacy. Advised her to speak with Dr. Vickey Huger about a sleep study at her upcoming appointment. Made patient aware that we will plan to keep her F/U as planned on 10/22 to reevaluate her symptoms. Patient verbalized understanding and thanked me for the call.

## 2016-10-18 ENCOUNTER — Other Ambulatory Visit: Payer: Self-pay | Admitting: Neurology

## 2016-10-22 ENCOUNTER — Ambulatory Visit
Admission: RE | Admit: 2016-10-22 | Discharge: 2016-10-22 | Disposition: A | Discharge status: Home / Self Care | Payer: Medicare Other | Source: Ambulatory Visit | Attending: Adult Health | Admitting: Adult Health

## 2016-10-22 DIAGNOSIS — G35 Multiple sclerosis: Secondary | ICD-10-CM | POA: Diagnosis not present

## 2016-10-22 MED ORDER — GADOBENATE DIMEGLUMINE 529 MG/ML IV SOLN
17.0000 mL | Freq: Once | INTRAVENOUS | Status: AC | PRN
  Administered 2016-10-22: 17 mL via INTRAVENOUS

## 2016-10-27 ENCOUNTER — Telehealth: Payer: Self-pay | Admitting: Neurology

## 2016-10-27 ENCOUNTER — Telehealth: Payer: Self-pay

## 2016-10-27 DIAGNOSIS — M5030 Other cervical disc degeneration, unspecified cervical region: Secondary | ICD-10-CM

## 2016-10-27 NOTE — Telephone Encounter (Signed)
-----   Message from Melvyn Novas, MD sent at 10/27/2016  4:12 PM EDT ----- Please relate MRI results- there is narrowing of the canal without cleat nerve compression. CD

## 2016-10-27 NOTE — Telephone Encounter (Signed)
I called patient and made her aware of the cervical MRI results and brain results. I informed her that the brain MRI was unchanged. 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 Informed the patient that there was no recommendations at this time. She questioned if this is something that would need to be followed up by a neurosurgeon. I informed her that was not in her recommendation but that I would make her aware of her concern and let her know what she says. Pt was appreciative and verbalized understanding.

## 2016-10-27 NOTE — Telephone Encounter (Signed)
-----   Message from Summer Penny, NP sent at 10/27/2016  9:22 AM EDT ----- No change in MRI brain. Please call patient with results

## 2016-10-27 NOTE — Telephone Encounter (Signed)
I called and left a detailed message with results, ok per dpr. I advised patient to call with any questions or concerns.

## 2016-10-28 ENCOUNTER — Telehealth: Payer: Self-pay

## 2016-10-28 NOTE — Addendum Note (Signed)
Addended by: Melvyn Novas on: 10/28/2016 02:03 PM   Modules accepted: Orders

## 2016-10-28 NOTE — Telephone Encounter (Signed)
I called pt. I advised her that Aundra Millet, NP and Dr. 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 There was one spot on her spinal cord, possible artifact, that we will continue to follow. Pt verbalized understanding of results. Pt had no questions at this time but was encouraged to call back if questions arise.

## 2016-10-28 NOTE — Telephone Encounter (Signed)
I can arrange for a NS consult to have their opinion. And this may give her peace of mind. CD

## 2016-10-28 NOTE — Telephone Encounter (Signed)
-----   Message from Butch Penny, NP sent at 10/27/2016  9:26 AM EDT ----- There appears to be stenosis at several levels of the spine however no nerve root compression. There is one spot at on the spinal cord however this was felt to be artifact but we will follow this. Please call patient with results. I will forward to Dr. Vickey Huger for her review as well.

## 2016-11-03 ENCOUNTER — Encounter: Payer: Self-pay | Admitting: Internal Medicine

## 2016-11-03 ENCOUNTER — Ambulatory Visit (INDEPENDENT_AMBULATORY_CARE_PROVIDER_SITE_OTHER): Payer: Medicare Other | Admitting: Internal Medicine

## 2016-11-03 ENCOUNTER — Telehealth: Payer: Self-pay | Admitting: Neurology

## 2016-11-03 VITALS — BP 112/72 | HR 76 | Ht 68.0 in | Wt 175.4 lb

## 2016-11-03 DIAGNOSIS — R0789 Other chest pain: Secondary | ICD-10-CM

## 2016-11-03 DIAGNOSIS — R002 Palpitations: Secondary | ICD-10-CM | POA: Diagnosis not present

## 2016-11-03 DIAGNOSIS — R0609 Other forms of dyspnea: Secondary | ICD-10-CM | POA: Diagnosis not present

## 2016-11-03 NOTE — Telephone Encounter (Signed)
Pt calling to inform that she rcvd a summons for [employer] and is asking that Dr Vickey Huger prepares a letter on her behalf.  Pt states she does not feel up to it.

## 2016-11-03 NOTE — Progress Notes (Signed)
Follow-up Outpatient Visit Date: 11/03/2016  Primary Care Provider: Marden Noble, MD [address] [place] [address]  Chief Complaint: Follow-up chest pain  HPI:  Summer Holloway is a 66 y.o. year-old female with history of hypertension, hyperlipidemia, GERD, multiple sclerosis, and fibromyalgia, who presents for follow-up of chest pain. I last saw her about a month ago, at which time she reported developing chest pain with walking last December. It had been getting progressively worse and has been accompanied by shortness of breath.  Subsequent pharmacologic myocardial perfusion stress test was ischemia or scar.  Today, Summer Holloway reports that she has been feeling well without any further episodes of chest pain. She has stable exertional dyspnea and occasional dizziness that she attributes to her MS and ataxia. She notes a few episodes of palpitations, which seemed to be more frequent after our switch from propranolol to carvedilol. She is otherwise tolerating carvedilol well. She denies orthopnea, PND, and edema. She has not followed up with Dr. Vickey Huger to discuss potential for sleep study  --------------------------------------------------------------------------------------------------  Cardiovascular History & Procedures: Cardiovascular Problems:  Accelerating angina  Palpitations and PVCs  Risk Factors:  Hypertension, hyperlipidemia, and age > 27  Cath/PCI:  None  CV Surgery:  None  EP Procedures and Devices:  None  Non-Invasive Evaluation(s):  Pharmacologic MPI (10/07/16): Normal study without ischemia or scar.  LVEF 66%.  Stress test: ~10 years ago; negative per patient's report  Recent CV Pertinent Labs: Lab Results  Component Value Date   K 4.8 09/22/2016   BUN 15 09/22/2016   CREATININE 0.79 09/22/2016    Past medical and surgical history were reviewed and updated in EPIC.  Current Meds  Medication Sig  . ALPRAZolam (XANAX) 0.25  MG tablet TAKE 1 TABLET BY MOUTH EVERY DAY AS NEEDED FOR ANXIETY (1-2 TABLETS AT ONSET OF PANIC)  . amitriptyline (ELAVIL) 50 MG tablet Take 25 mg by mouth at bedtime.  Marland Kitchen amLODipine (NORVASC) 5 MG tablet Take 5 mg by mouth daily.  Marland Kitchen aspirin EC 81 MG tablet Take 1 tablet (81 mg total) by mouth daily.  Marland Kitchen buPROPion (WELLBUTRIN XL) 150 MG 24 hr tablet Take 150 mg by mouth daily.  . carvedilol (COREG) 6.25 MG tablet Take 1 tablet (6.25 mg total) by mouth 2 (two) times daily.  . Dimethyl Fumarate (TECFIDERA) 120 & 240 MG MISC Titration starter: 120mg  PO BID x7 days. 240mg  PO BID thereafter. Take after a meal.  . DULoxetine (CYMBALTA) 60 MG capsule Take 60 mg by mouth daily.  Marland Kitchen escitalopram (LEXAPRO) 10 MG tablet Take 10 mg by mouth 2 (two) times daily.  Marland Kitchen estradiol (ESTRACE) 1 MG tablet Take 0.5 mg by mouth daily.   Marland Kitchen levothyroxine (SYNTHROID, LEVOTHROID) 50 MCG tablet Take 50 mcg by mouth daily before breakfast.  . pantoprazole (PROTONIX) 40 MG tablet Take 40 mg by mouth daily.  . pregabalin (LYRICA) 100 MG capsule Take 100 mg by mouth. 2 capsules in am and 1 capsule in pm   . rosuvastatin (CRESTOR) 5 MG tablet Take 5 mg by mouth daily.  . [DISCONTINUED] Zoster Vaccine Adjuvanted [facility]) injection     Allergies: Patient has no known allergies.  Social History   Social History  . Marital status: Married    Spouse name: Deniece Portela  . Number of children: N/A  . Years of education: N/A   Occupational History  . Not on file.   Social History Main Topics  . Smoking status: Never Smoker  .  Smokeless tobacco: Never Used  . Alcohol use No  . Drug use: Unknown  . Sexual activity: Not on file   Other Topics Concern  . Not on file   Social History Narrative   Lives with husband    Family History  Problem Relation Age of Onset  . Fibromyalgia Mother   . Anxiety disorder Mother   . Heart attack Father 30  . CAD Father   . Heart attack Cousin     Review of Systems: Review of Systems    Constitutional: Negative.   HENT: Positive for hearing loss.   Eyes: Positive for blurred vision.  Respiratory: Positive for shortness of breath (With exertion-stable).   Cardiovascular: Positive for palpitations.  Gastrointestinal: Negative.   Genitourinary: Negative.   Musculoskeletal: Negative.   Skin: Negative.   Neurological: Positive for dizziness (Ataxia).  Endo/Heme/Allergies: Negative.   Psychiatric/Behavioral: Positive for depression.   --------------------------------------------------------------------------------------------------  Physical Exam: BP 112/72   Pulse 76   Ht 5\' 8"  (1.727 m)   Wt 175 lb 6.4 oz (79.6 kg)   BMI 26.67 kg/m   General:  Well-developed, well-nourished woman, seated comfortably in the exam room. HEENT: No conjunctival pallor or scleral icterus. Moist mucous membranes.  OP clear. Neck: Supple without lymphadenopathy, thyromegaly, JVD, or HJR. Lungs: Normal work of breathing. Clear to auscultation bilaterally without wheezes or crackles. Heart: Regular rate and rhythm without murmurs, rubs, or gallops. Non-displaced PMI. Abd: Bowel sounds present. Soft, NT/ND without hepatosplenomegaly Ext: No lower extremity edema. Radial, PT, and DP pulses are 2+ bilaterally. Skin: Warm and dry without rash.  Lab Results  Component Value Date   WBC 7.0 09/22/2016   HGB 13.5 09/22/2016   HCT 39.1 09/22/2016   MCV 93 09/22/2016   PLT 393 (H) 09/22/2016    Lab Results  Component Value Date   NA 141 09/22/2016   K 4.8 09/22/2016   CL 101 09/22/2016   CO2 27 09/22/2016   BUN 15 09/22/2016   CREATININE 0.79 09/22/2016   GLUCOSE 87 09/22/2016   ALT 16 09/22/2016    No results found for: CHOL, HDL, LDLCALC, LDLDIRECT, TRIG, CHOLHDL  --------------------------------------------------------------------------------------------------  ASSESSMENT AND PLAN: Atypical chest pain Symptoms have resolved since her last visit. Myocardial perfusion stress  test was normal without ischemia or scar. We will defer further workup at this time.  Dyspnea on exertion This has been a long-standing issue for the patient. Exam today remains normal without murmurs. EF normal on recent stress test. We will forego additional workup at this time. I encouraged Summer Holloway to consider exercising and to discuss sleep study when she sees Dr. Vickey Huger in the spring.  Palpitations Episodes are little more frequent falling switch from propranolol to carvedilol. We discussed changing back propranolol, though Summer Holloway would like to give carvedilol little more time.  Follow-up: Return to clinic in 6 months.  Yvonne Kendall, MD 11/03/2016 8:40 AM

## 2016-11-03 NOTE — Patient Instructions (Signed)
Medication Instructions:  Your physician recommends that you continue on your current medications as directed. Please refer to the Current Medication list given to you today.   Labwork: None   Testing/Procedures: None   Follow-Up: Your physician wants you to follow-up in: 6 months with Dr End. (April 2019).  You will receive a reminder letter in the mail two months in advance. If you don't receive a letter, please call our office to schedule the follow-up appointment.        If you need a refill on your cardiac medications before your next appointment, please call your pharmacy.

## 2016-11-04 ENCOUNTER — Encounter: Payer: Self-pay | Admitting: Neurology

## 2016-11-04 NOTE — Telephone Encounter (Signed)
Called the patient and made her aware that a letter was written on her behalf. I have printed the letter and will mail to the address on file which I confirmed was correct with the patient. Pt was very appreciative for the call and for doing the letter.

## 2017-01-01 ENCOUNTER — Other Ambulatory Visit: Payer: Self-pay | Admitting: Internal Medicine

## 2017-01-01 ENCOUNTER — Other Ambulatory Visit: Payer: Self-pay | Admitting: Neurology

## 2017-01-01 MED ORDER — ALPRAZOLAM 0.25 MG PO TABS: ORAL_TABLET | ORAL | 5 refills | Status: DC

## 2017-01-01 MED ORDER — CARVEDILOL 6.25 MG PO TABS: 6.2500 mg | ORAL_TABLET | Freq: Two times a day (BID) | ORAL | 2 refills | Status: DC

## 2017-03-24 ENCOUNTER — Other Ambulatory Visit: Payer: Self-pay | Admitting: Neurology

## 2017-03-24 ENCOUNTER — Ambulatory Visit: Payer: Medicare Other | Admitting: Neurology

## 2017-03-24 ENCOUNTER — Encounter: Payer: Self-pay | Admitting: Neurology

## 2017-03-24 VITALS — BP 134/87 | HR 79 | Ht 67.0 in | Wt 172.0 lb

## 2017-03-24 DIAGNOSIS — Z79899 Other long term (current) drug therapy: Secondary | ICD-10-CM

## 2017-03-24 DIAGNOSIS — G35 Multiple sclerosis: Secondary | ICD-10-CM

## 2017-03-24 NOTE — Patient Instructions (Signed)

## 2017-03-24 NOTE — Progress Notes (Signed)
Provider:  Melvyn Novas, M D  Referring Provider: Marden Noble, MD Primary Care Physician:  Marden Noble, MD  Chief Complaint  Patient presents with  . Follow-up    pt with husband, rm 10. pt states that everything has been about the same since last visit    HPI:  Summer Holloway is a 67 y.o. female , seen here in a  revisit  ( from Dr. Kevan Ny)  for MS follow up.   Interval history from 24 March 2017.  I have the pleasure of meeting today with Summer Holloway and her husband.  The patient has been well controlled on Tecfidera used in the treatment of relapsing remitting multiple sclerosis.  The most recent MRI from October 22, 2016 showed within the brain multiple T2 and FLAIR hyperintense foci, in the cerebellar and middle cerebellar peduncle.  There is also a focus in the left posterior pons.  Brain volume is normal which means that the patient does not have the late stage MS related brain atrophy or black holes.  This MRI did not show major changes in comparison to a study from January 2018.  She had been treated with Tecfidera but recently attended a lecture on Tysabri and would be interested to see if this medication could help her further.  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  A cervical spine MRI had also been obtained and showed only some H related bony changes but no lesions within the spinal cord.  1 of Summer Holloway main concern is that she feels very fatigued there is a lack of energy, she would like to sleep all day and she also eats more.  I also wonder if Tysabri may give her that additional energy boost that many patients have experienced that is not part of the Tecfidera effect.  I will order today the necessary blood tests including a JC virus titer, I make sure that she does have antibodies for varicella-zoster virus, she completed the 2 stage latest varicella virus vaccination in 2018.  Hepatitis panel will be ordered , too.   She reviewed the MRIs  with me today, and I pointed out where her white matter lesions are located.      MM 09/22/16, Summer Holloway is a 67 year old female with a history of multiple sclerosis. She returns today for follow-up. She is currently on Medicare and tolerating it well. She reports that she started this March. She reports that she did notice after starting medication she developed numbness in the feet bilaterally. She states that she has ongoing generalized weakness but denies any new weakness. She reports that she is having incontinence of bowels for approximately 2 months. She states this does not occur with every bowel movement but does happen frequently. She states that she has noticed blurry vision and diplopia that started approximately 3 months ago. Reports that her mood is stable. Reports that she sleeps okay. Reports that her balance has been off. She had a fall approximately 3 weeks ago and suffered a laceration to the left arm. She did not start any new medication. She returns today for an evaluation.  HISTORY 05/27/16 Copied from Dr. Holland Commons notes: Summer Holloway is seen here today in the presence of her husband and was referred her primary care physician Dr. Kevan Ny for evaluation of disequilibrium, unsteady gait, possible ataxia. She carries a diagnosis of weight gain, hypertension, she also has been  followed by Dr. Kevan Ny for fibromyalgia, insomnia and has been treated for depression. She has been taking Lyrica and amitriptyline. The chronic disequilibrium begun according to Dr. Kevan Ny notes about  5 years ago " unsteady  " while  the patient walks or is in motion. Sometimes she has have to hold onto her husband's arm to steady her gait. She feels as it is being pulled into a different direction, cannot walk a straight line. She jokingly reports having no chance to pass a drunk driving test. She is only experiencing this pulling when in motion and  not at rest. She does not describe vertigo. When anxious she is much more off balance!  She is pleasant and conversant, reports that she is afraid of falling. She has not been evaluated by ENT. She does have hearing loss and a chronic tinnitus  ( a constant high pitches sound ) for 35 years.  She had no MRI of the brain and has no family history of schwannoma.Maternal aunt clarabelle had ataxia, vertigo and auditory hallucinations. Mother had anxiety.  Social history:  Married, non smoker, non ETOH, use, caffeine , none. Retired, Comptroller.   Summer Holloway is seen here today in the presence of her husband and was referred her primary care physician Dr. Kevan Ny for evaluation of disequilibrium, unsteady gait, possible ataxia. She carries a diagnosis of weight gain, hypertension, she also has been followed by Dr. Kevan Ny for fibromyalgia, insomnia and has been treated for depression. She has been taking Lyrica and amitriptyline.  The chronic disequilibrium begun according to Dr. Kevan Ny notes about  5 years ago " unsteady  " while  the patient walks or is in motion. Sometimes she has have to hold onto her husband's arm to steady her gait. She feels as it is being pulled into a different direction, cannot walk a straight line. She jokingly reports having no chance to pass a drunk driving test. She is only experiencing this pulling when in motion and not at rest. She does not describe vertigo. When anxious she is much more off balance!  She is pleasant and conversant, reports that she is afraid of falling. She has not been evaluated by ENT. She does have hearing loss and a chronic tinnitus  ( a constant high pitches sound ) for 35 years.  She had no MRI of the brain and has no family history of schwannoma.Maternal aunt Clarabelle had ataxia, vertigo and auditory hallucinations. Mother had anxiety.  Social history:  Married, non smoker, non ETOH, use, caffeine , none. Retired, Comptroller.   Interval history from  03/05/2016, I have pleasure of seeing Summer Holloway today to meet them and discuss the results of her recent MRI which was abnormal and her spinal tap results, cerebral spinal fluid have been sent for oligoclonal bands, cells and differential, glucose and protein. Patient seen and a routine revisit today is 05/27/2016. Summer Holloway has tolerated Tecfidera very well - improved tremor and less balance problems subjectively. Today CBC and CMET , next visit with NP after another 6 -8 weeks.    Review of Systems: Out of a complete 14 system review, the patient complains of only the following symptoms, and all other reviewed systems are negative.  Weight gain, fatigue, palpitations, hearing loss, tinnitus, sometimes spinning sensation but not when not in motion. Blurred vision, snoring, urinary urgency, increased thirst, flushing, joint pain and aching muscles runny nose, depression anxiety, death interest in activities headaches confusion at times memory loss or amnesia,  snoring. Hypertension high cholesterol depression anxiety and fibromyalgia ovarian cyst and hysterectomy.  Social History   Socioeconomic History  . Marital status: Married    Spouse name: Deniece Portela  . Number of children: Not on file  . Years of education: Not on file  . Highest education level: Not on file  Social Needs  . Financial resource strain: Not on file  . Food insecurity - worry: Not on file  . Food insecurity - inability: Not on file  . Transportation needs - medical: Not on file  . Transportation needs - non-medical: Not on file  Occupational History  . Not on file  Tobacco Use  . Smoking status: Never Smoker  . Smokeless tobacco: Never Used  Substance and Sexual Activity  . Alcohol use: No  . Drug use: Not on file  . Sexual activity: Not on file  Other Topics Concern  . Not on file  Social History Narrative   Lives with husband    Family History  Problem Relation Age of Onset  . Fibromyalgia Mother     . Anxiety disorder Mother   . Heart attack Father 7  . CAD Father   . Heart attack Cousin     Past Medical History:  Diagnosis Date  . Anxiety   . Bursitis of left hip   . Depression   . Esophageal reflux   . Fibromyalgia   . Hearing loss   . Hyperlipemia   . Hyperlipidemia   . Hypertension   . Multiple sclerosis (HCC)   . Osteoarthritis   . PVC (premature ventricular contraction)   . Sciatica   . Tinnitus     Past Surgical History:  Procedure Laterality Date  . BREAST BIOPSY  05/24/2002  . CATARACT EXTRACTION, BILATERAL    . PARTIAL HYSTERECTOMY      Current Outpatient Medications  Medication Sig Dispense Refill  . ALPRAZolam (XANAX) 0.25 MG tablet TAKE 1 TABLET BY MOUTH EVERY DAY AS NEEDED FOR ANXIETY 30 tablet 5  . amitriptyline (ELAVIL) 50 MG tablet Take 25 mg by mouth at bedtime.    Marland Kitchen amLODipine (NORVASC) 5 MG tablet Take 5 mg by mouth daily.    Marland Kitchen aspirin EC 81 MG tablet Take 1 tablet (81 mg total) by mouth daily.    Marland Kitchen buPROPion (WELLBUTRIN XL) 150 MG 24 hr tablet Take 150 mg by mouth daily.    . carvedilol (COREG) 6.25 MG tablet Take 1 tablet (6.25 mg total) by mouth 2 (two) times daily. 180 tablet 2  . Dimethyl Fumarate (TECFIDERA) 120 & 240 MG MISC Titration starter: 120mg  PO BID x7 days. 240mg  PO BID thereafter. Take after a meal. 180 each 0  . DULoxetine (CYMBALTA) 60 MG capsule Take 60 mg by mouth daily.    Marland Kitchen escitalopram (LEXAPRO) 10 MG tablet Take 10 mg by mouth 2 (two) times daily.    Marland Kitchen estradiol (ESTRACE) 1 MG tablet Take 0.5 mg by mouth daily.   3  . FLUoxetine (PROZAC) 40 MG capsule Take 40 mg by mouth daily.    Marland Kitchen HYDROcodone-acetaminophen (NORCO) 10-325 MG tablet Take 1 tablet by mouth every 12 (twelve) hours as needed.    Marland Kitchen levothyroxine (SYNTHROID, LEVOTHROID) 50 MCG tablet Take 50 mcg by mouth daily before breakfast.    . pantoprazole (PROTONIX) 40 MG tablet Take 40 mg by mouth daily.    . pregabalin (LYRICA) 100 MG capsule Take 100 mg by mouth.  2 capsules in am and 1 capsule in pm     .  rosuvastatin (CRESTOR) 5 MG tablet Take 5 mg by mouth daily.     No current facility-administered medications for this visit.     Allergies as of 03/24/2017  . (No Known Allergies)   [facility] [address], [address] [place], [place] [address] [phone]  NEUROIMAGING REPORT    STUDY DATE: 02/10/2016 PATIENT NAME: Summer Holloway DOB: 1950-04-19 MRN: [id number]  EXAM: MRI Brain with and without contrast  ORDERING CLINICIAN: Melvyn Novas M.D. CLINICAL HISTORY: 67 year old woman with ataxia, hearing loss and frequent falls COMPARISON FILMS: None   CSF Oligoclonal Bands REPORT   Comments: Bands noted          Reference Range: No bands  The patient's CSF contains (>5) well defined gamma  restriction bands. These bands indicate abnormal  synthesis of gammaglobulins in the central nervous  system. However, patient's corresponding serum was not  submitted and we are unable to define whether these  gammaglobulins are of systemic or intracerebral origin.  Oligoclonal bands are present in the CSF of more than  85% of patients with clinically definite multiple  sclerosis (MS). To distinguish between oligoclonal  bands in the CSF due to a peripheral gammopathy and  oligoclonal bands due to local production in the CNS,  serum and CSF should be tested simultaneously.  Oligoclonal bands can however be observed in a variety  of other diseases, e.g., subacute sclerosing panen-  cephalitis, inflammatory polyneuropathy, CNS lupus,  and brain tumors and infarctions. The clinical  significance of a numerical band count, determined  by isoelectric focusing, has not been definitively  defined. The data should be interpreted in conjunction  with all pertinent clinical and laboratory data for  this patient.   Resulting Agency [facility]  Narrative       Vitals: BP 134/87   Pulse 79   Ht 5\' 7"  (1.702 m)    Wt 172 lb (78 kg)   BMI 26.94 kg/m  Last Weight:  Wt Readings from Last 1 Encounters:  03/24/17 172 lb (78 kg)   ION:GEXB mass index is 26.94 kg/m.     Last Height:   Ht Readings from Last 1 Encounters:  03/24/17 5\' 7"  (1.702 m)    Physical exam:  General: The patient is awake, alert and appears not in acute distress. The patient is well groomed. Head: Normocephalic, atraumatic. Neck is supple. Mallampati 2  neck circumference: 14.5 . Nasal airflow patent , Skin:  Without evidence of edema, or rash Trunk: BMI is 28,  The patient's posture is erect, she has hyperlordosis   Neurologic exam : The patient is awake and alert, oriented to place and time.     Attention span & concentration ability appears normal.  Speech is fluent,  without dysarthria, dysphonia or aphasia.  Mood and affect are appropriate.  Cranial nerves: Pupils are equal and briskly reactive to light. Funduscopic exam without evidence of pallor or edema.  No change in colour vision," red is red " Loss of acuity, diplopia, she reports horizontal diplopia.  Extraocular movements  in vertical and horizontal planes intact and without nystagmus.  Visual fields by finger perimetry are intact.Hearing loss, evident in crowds and on the phone.  Facial sensation intact to fine touch. Facial motor strength is symmetric and tongue and uvula move midline. Shoulder shrug was symmetrical. There is mild titubation. NO LONGER DIPLOPIA> NEW GLASSES.  Motor exam:   Normal tone, muscle bulk and symmetric strength in all extremities. Good pinch strength, and excellent grip  strength.  Toe movements were strong . The patient is very flat feet and smallish musculature for the hip quadriceps, hamstrings and gastrocnemius muscle Sensory:  Fine touch, pinprick and vibration were tested in all extremities. Proprioception tested in the upper extremities was normal. No pronator drift.  Coordination: Rapid alternating movements in the  fingers/hands were normal, not slowed.  Finger-to-nose maneuver without evidence of ataxia, dysmetria and only mild , but low amplitude tremor. Her hand will suddenly jerk, change in handwriting towards smaller and smaller script.    Gait and station: Patient walks without assistive device and is able unassisted to climb up to the exam table. Strength within normal limits. Stance is stable and wide based. Her gait shows a tendency to sway, but turning is stable/Tandem gait remains fragmented. Turns still with 3  Steps.  Romberg testing is positive, patient can compensate for push from the back but not so easily for push from the chest, revealing a tendency of retropulsion.  Deep tendon reflexes: in the upper and lower extremities are symmetric and intact.   Established  MS Patient on Tacfidera as her first drug. I spent more than 25  minutes of face to face time with the patient and her spouse. Greater than 50% of time was spent in counseling and coordination of care. We have discussed the diagnosis and differential and I answered the patient's questions.  tecd fiderae was well tolerated, after initial diarrhea.  She has Urinary urge incontinence- neurogenic bladder . She is interested in Tysabri, and I think she could be a good candidate.   Assessment:  After physical and neurologic examination, review of laboratory studies,  Personal review of imaging studies, reports of other /same  Imaging studies ,  Results of polysomnography/ neurophysiology testing and pre-existing records as far as provided in visit., my assessment is   1)  MS diagnosis , no progression in lesions from 01-2016 to now.   Plan:  Treatment plan and additional workup :  Tacfidera has been well  tolerated,  No or slow  progression of MS. She is interested in Tysabri   CMET and CBC diff, LFT, JCV panel. And hepatitis, patient has been VACCINATED FOR VARICELLA ZOSTER.   Rv in 2-3 month.   Porfirio Mylar Shiv Shuey  MD  03/24/2017   CC: Marden Noble, Md [address] [place], [place] [address]

## 2017-03-25 ENCOUNTER — Telehealth: Payer: Self-pay | Admitting: Neurology

## 2017-03-25 LAB — COMPREHENSIVE METABOLIC PANEL
ALBUMIN: 4.3 g/dL (ref 3.6–4.8)
ALT: 10 IU/L (ref 0–32)
AST: 17 IU/L (ref 0–40)
Albumin/Globulin Ratio: 1.6 (ref 1.2–2.2)
Alkaline Phosphatase: 122 IU/L — ABNORMAL HIGH (ref 39–117)
BILIRUBIN TOTAL: 0.2 mg/dL (ref 0.0–1.2)
BUN / CREAT RATIO: 17 (ref 12–28)
BUN: 15 mg/dL (ref 8–27)
CHLORIDE: 98 mmol/L (ref 96–106)
CO2: 26 mmol/L (ref 20–29)
Calcium: 9.3 mg/dL (ref 8.7–10.3)
Creatinine, Ser: 0.88 mg/dL (ref 0.57–1.00)
GFR, EST AFRICAN AMERICAN: 79 mL/min/{1.73_m2} (ref >59)
GFR, EST NON AFRICAN AMERICAN: 68 mL/min/{1.73_m2} (ref >59)
Globulin, Total: 2.7 g/dL (ref 1.5–4.5)
Glucose: 93 mg/dL (ref 65–99)
POTASSIUM: 4.7 mmol/L (ref 3.5–5.2)
Sodium: 142 mmol/L (ref 134–144)
TOTAL PROTEIN: 7 g/dL (ref 6.0–8.5)

## 2017-03-25 LAB — CBC WITH DIFFERENTIAL/PLATELET
BASOS: 1 %
Basophils Absolute: 0.1 10*3/uL (ref 0.0–0.2)
EOS (ABSOLUTE): 0.1 10*3/uL (ref 0.0–0.4)
EOS: 1 %
HEMATOCRIT: 41.4 % (ref 34.0–46.6)
HEMOGLOBIN: 14.6 g/dL (ref 11.1–15.9)
IMMATURE GRANS (ABS): 0 10*3/uL (ref 0.0–0.1)
Immature Granulocytes: 0 %
LYMPHS: 20 %
Lymphocytes Absolute: 1.4 10*3/uL (ref 0.7–3.1)
MCH: 32.7 pg (ref 26.6–33.0)
MCHC: 35.3 g/dL (ref 31.5–35.7)
MCV: 93 fL (ref 79–97)
Monocytes Absolute: 0.6 10*3/uL (ref 0.1–0.9)
Monocytes: 8 %
NEUTROS ABS: 5 10*3/uL (ref 1.4–7.0)
Neutrophils: 70 %
Platelets: 368 10*3/uL (ref 150–379)
RBC: 4.47 x10E6/uL (ref 3.77–5.28)
RDW: 14.4 % (ref 12.3–15.4)
WBC: 7.2 10*3/uL (ref 3.4–10.8)

## 2017-03-25 LAB — HEPATITIS C ANTIBODY

## 2017-03-25 LAB — HEPATITIS B SURFACE ANTIBODY,QUALITATIVE: Hep B Surface Ab, Qual: NONREACTIVE

## 2017-03-25 LAB — HEPATITIS B CORE ANTIBODY, TOTAL: Hep B Core Total Ab: NEGATIVE

## 2017-03-25 NOTE — Telephone Encounter (Signed)
-----   Message from Melvyn Novas, MD sent at 03/25/2017  1:10 PM EDT ----- Normal metabolic panel, CBC and Diff and negative for hepatitis. patienrt received vaccine for herpes zoster, in 2 parts in 2018- Pending is still a  JC Virus from [facility]- if negative - ready to change to Tysabri.

## 2017-03-25 NOTE — Telephone Encounter (Signed)
Called and went over the lab work with the patient. I informed her of the results and that we are still waiting on the JCV to come back. Pt verbalized understanding.

## 2017-04-02 NOTE — Telephone Encounter (Signed)
Pt is asking to be called when the JCV comes back so that she will know if she will be put on new MS medication

## 2017-04-02 NOTE — Telephone Encounter (Signed)
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 Patient verbalized understanding and will follow up with Dr Vickey Huger on April 2 to discuss the next plan.

## 2017-04-04 ENCOUNTER — Encounter: Payer: Self-pay | Admitting: Neurology

## 2017-04-14 ENCOUNTER — Ambulatory Visit: Payer: Medicare Other | Admitting: Neurology

## 2017-04-14 ENCOUNTER — Other Ambulatory Visit: Payer: Self-pay | Admitting: Neurology

## 2017-04-14 ENCOUNTER — Encounter: Payer: Self-pay | Admitting: Neurology

## 2017-04-14 VITALS — BP 125/75 | HR 82 | Ht 66.0 in | Wt 172.0 lb

## 2017-04-14 DIAGNOSIS — G35 Multiple sclerosis: Secondary | ICD-10-CM

## 2017-04-14 MED ORDER — DIMETHYL FUMARATE 240 MG PO CPDR: 240.0000 mg | DELAYED_RELEASE_CAPSULE | Freq: Two times a day (BID) | ORAL | 0 refills | Status: DC

## 2017-04-14 MED ORDER — DIMETHYL FUMARATE 120 & 240 MG PO MISC: ORAL | 1 refills | Status: DC

## 2017-04-14 NOTE — Progress Notes (Signed)
Provider:  Melvyn Novas, M D  Referring Provider: Marden Noble, MD Primary Care Physician:  Marden Noble, MD  Chief Complaint  Patient presents with  . Follow-up    pt with husband, rm 11. pt here to discuss next treatment plan. 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 She is wanting to know next step.     HPI:  Summer Holloway is a 67 y.o. female , seen here in a  revisit  ( from Dr. Kevan Ny)  for MS follow up.   Interval history, 04-14-2017,   JCV was psitive and Tysabri is not longer a preferred infusion therapy. We discussed changing to OCREVUS, she agrees. I will give her the necessary papers, information.  The patient has had TB testing, CMET and CBC diff. but I will need to add GOLD TB test.      Interval history from 24 March 2017.  I have the pleasure of meeting today with Summer Holloway and her husband.  The patient has been well controlled on Tecfidera used in the treatment of relapsing remitting multiple sclerosis.  The most recent MRI from October 22, 2016 showed within the brain multiple T2 and FLAIR hyperintense foci, in the cerebellar and middle cerebellar peduncle.  There is also a focus in the left posterior pons.  Brain volume is normal which means that the patient does not have the late stage MS related brain atrophy or black holes.  This MRI did not show major changes in comparison to a study from January 2018.  She had been treated with Tecfidera but recently attended a lecture on Tysabri and would be interested to see if this medication could help her further.  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  A cervical spine MRI had also been obtained and showed only some H related bony changes but no lesions within the spinal cord.  1 of Summer Holloway main concern is that she feels very fatigued there is a lack of energy, she would like to sleep all day and she also eats  more.  I also wonder if Tysabri may give her that additional energy boost that many patients have experienced that is not part of the Tecfidera effect.  I will order today the necessary blood tests including a JC virus titer, I make sure that she does have antibodies for varicella-zoster virus, she completed the 2 stage latest varicella virus vaccination in 2018. Hepatitis panel will be ordered , too.   She reviewed the MRIs  with me today, and I pointed out where her white matter lesions are located.      Summer Holloway, Summer Holloway is a 67 year old female with a history of multiple sclerosis. She returns today for follow-up. She is currently on Medicare and tolerating it well. She reports that she started this March. She reports that she did notice after starting medication she developed numbness in the feet bilaterally. She states that she has ongoing generalized weakness but denies any new weakness. She reports that she is having incontinence of bowels for approximately 2 months. She states this does not occur with every bowel movement but does happen frequently. She states that she has noticed blurry vision and diplopia that started approximately 3 months ago. Reports that her mood is stable. Reports that she sleeps okay. Reports that her balance has been off. She had a fall approximately 3  weeks ago and suffered a laceration to the left arm. She did not start any new medication. She returns today for an evaluation.  HISTORY 05/27/16 Copied from Dr. Holland Commons notes: Summer Holloway is seen here today in the presence of her husband and was referred her primary care physician Dr. Kevan Ny for evaluation of disequilibrium, unsteady gait, possible ataxia. She carries a diagnosis of weight gain, hypertension, she also has been followed by Dr. Kevan Ny for fibromyalgia, insomnia and has been treated for depression. She has been taking Lyrica and amitriptyline. The chronic disequilibrium begun according to Dr. Kevan Ny  notes about  5 years ago " unsteady  " while  the patient walks or is in motion. Sometimes she has have to hold onto her husband's arm to steady her gait. She feels as it is being pulled into a different direction, cannot walk a straight line. She jokingly reports having no chance to pass a drunk driving test. She is only experiencing this pulling when in motion and not at rest. She does not describe vertigo. When anxious she is much more off balance!  She is pleasant and conversant, reports that she is afraid of falling. She has not been evaluated by ENT. She does have hearing loss and a chronic tinnitus  ( a constant high pitches sound ) for 35 years.  She had no MRI of the brain and has no family history of schwannoma.Maternal aunt clarabelle had ataxia, vertigo and auditory hallucinations. Mother had anxiety.  Social history:  Married, non smoker, non ETOH, use, caffeine , none. Retired, Comptroller.   Summer Holloway is seen here today in the presence of her husband and was referred her primary care physician Dr. Kevan Ny for evaluation of disequilibrium, unsteady gait, possible ataxia. She carries a diagnosis of weight gain, hypertension, she also has been followed by Dr. Kevan Ny for fibromyalgia, insomnia and has been treated for depression. She has been taking Lyrica and amitriptyline.  The chronic disequilibrium begun according to Dr. Kevan Ny notes about  5 years ago " unsteady  " while  the patient walks or is in motion. Sometimes she has have to hold onto her husband's arm to steady her gait. She feels as it is being pulled into a different direction, cannot walk a straight line. She jokingly reports having no chance to pass a drunk driving test. She is only experiencing this pulling when in motion and not at rest. She does not describe vertigo. When anxious she is much more off balance!  She is pleasant and conversant, reports that she is afraid of falling. She has not been evaluated by ENT. She does have  hearing loss and a chronic tinnitus  ( a constant high pitches sound ) for 35 years.  She had no MRI of the brain and has no family history of schwannoma.Maternal aunt Clarabelle had ataxia, vertigo and auditory hallucinations. Mother had anxiety.  Social history:  Married, non smoker, non ETOH, use, caffeine , none. Retired, Comptroller.   Interval history from 03/05/2016, I have pleasure of seeing Mr. and Mrs. Spahr today to meet them and discuss the results of her recent MRI which was abnormal and her spinal tap results, cerebral spinal fluid have been sent for oligoclonal bands, cells and differential, glucose and protein. Patient seen and a routine revisit today is 05/27/2016. Summer Holloway has tolerated Tecfidera very well - improved tremor and less balance problems subjectively. Today CBC and CMET , next visit with NP after another 6 -8 weeks.  Review of Systems: Out of a complete 14 system review, the patient complains of only the following symptoms, and all other reviewed systems are negative.  Weight gain, fatigue, palpitations, hearing loss, tinnitus, sometimes spinning sensation but not when not in motion. Blurred vision, snoring, urinary urgency, increased thirst, flushing, joint pain and aching muscles runny nose, depression anxiety, death interest in activities headaches confusion at times memory loss or amnesia, snoring. Hypertension high cholesterol depression anxiety and fibromyalgia ovarian cyst and hysterectomy.  Social History   Socioeconomic History  . Marital status: Married    Spouse name: Deniece Portela  . Number of children: Not on file  . Years of education: Not on file  . Highest education level: Not on file  Occupational History  . Not on file  Social Needs  . Financial resource strain: Not on file  . Food insecurity:    Worry: Not on file    Inability: Not on file  . Transportation needs:    Medical: Not on file    Non-medical: Not on file  Tobacco Use  . Smoking  status: Never Smoker  . Smokeless tobacco: Never Used  Substance and Sexual Activity  . Alcohol use: No  . Drug use: Not on file  . Sexual activity: Not on file  Lifestyle  . Physical activity:    Days per week: Not on file    Minutes per session: Not on file  . Stress: Not on file  Relationships  . Social connections:    Talks on phone: Not on file    Gets together: Not on file    Attends religious service: Not on file    Active member of club or organization: Not on file    Attends meetings of clubs or organizations: Not on file    Relationship status: Not on file  . Intimate partner violence:    Fear of current or ex partner: Not on file    Emotionally abused: Not on file    Physically abused: Not on file    Forced sexual activity: Not on file  Other Topics Concern  . Not on file  Social History Narrative   Lives with husband    Family History  Problem Relation Age of Onset  . Fibromyalgia Mother   . Anxiety disorder Mother   . Heart attack Father 24  . CAD Father   . Heart attack Cousin     Past Medical History:  Diagnosis Date  . Anxiety   . Bursitis of left hip   . Depression   . Esophageal reflux   . Fibromyalgia   . Hearing loss   . Hyperlipemia   . Hyperlipidemia   . Hypertension   . Multiple sclerosis (HCC)   . Osteoarthritis   . PVC (premature ventricular contraction)   . Sciatica   . Tinnitus     Past Surgical History:  Procedure Laterality Date  . BREAST BIOPSY  05/24/2002  . CATARACT EXTRACTION, BILATERAL    . PARTIAL HYSTERECTOMY      Current Outpatient Medications  Medication Sig Dispense Refill  . ALPRAZolam (XANAX) 0.25 MG tablet TAKE 1 TABLET BY MOUTH EVERY DAY AS NEEDED FOR ANXIETY 30 tablet 5  . amitriptyline (ELAVIL) 50 MG tablet Take 25 mg by mouth at bedtime.    Marland Kitchen amLODipine (NORVASC) 5 MG tablet Take 5 mg by mouth daily.    Marland Kitchen aspirin EC 81 MG tablet Take 1 tablet (81 mg total) by mouth daily.    Marland Kitchen  buPROPion (WELLBUTRIN XL)  150 MG 24 hr tablet Take 150 mg by mouth daily.    . carvedilol (COREG) 6.25 MG tablet Take 1 tablet (6.25 mg total) by mouth 2 (two) times daily. 180 tablet 2  . Dimethyl Fumarate (TECFIDERA) 120 & 240 MG MISC Titration starter: 120mg  PO BID x7 days. 240mg  PO BID thereafter. Take after a meal. 180 each 0  . DULoxetine (CYMBALTA) 60 MG capsule Take 60 mg by mouth daily.    Marland Kitchen escitalopram (LEXAPRO) 10 MG tablet Take 10 mg by mouth 2 (two) times daily.    Marland Kitchen estradiol (ESTRACE) 1 MG tablet Take 0.5 mg by mouth daily.   3  . FLUoxetine (PROZAC) 40 MG capsule Take 40 mg by mouth daily.    Marland Kitchen HYDROcodone-acetaminophen (NORCO) 10-325 MG tablet Take 1 tablet by mouth every 12 (twelve) hours as needed.    Marland Kitchen levothyroxine (SYNTHROID, LEVOTHROID) 50 MCG tablet Take 50 mcg by mouth daily before breakfast.    . pantoprazole (PROTONIX) 40 MG tablet Take 40 mg by mouth daily.    . pregabalin (LYRICA) 100 MG capsule Take 100 mg by mouth. 2 capsules in am and 1 capsule in pm     . rosuvastatin (CRESTOR) 5 MG tablet Take 5 mg by mouth daily.     No current facility-administered medications for this visit.     Allergies as of 04/14/2017  . (No Known Allergies)   [facility] [address], [address] [place], [place] [address] [phone]  NEUROIMAGING REPORT    STUDY DATE: 02/10/2016 PATIENT NAME: Summer Holloway DOB: [birth year] MRN: [id number]  EXAM: MRI Brain with and without contrast  ORDERING CLINICIAN: Melvyn Novas M.D. CLINICAL HISTORY: 67 year old woman with ataxia, hearing loss and frequent falls COMPARISON FILMS: None   CSF Oligoclonal Bands REPORT   Comments: Bands noted          Reference Range: No bands  The patient's CSF contains (>5) well defined gamma  restriction bands. These bands indicate abnormal  synthesis of gammaglobulins in the central nervous  system. However, patient's corresponding serum was not  submitted and we are unable to  define whether these  gammaglobulins are of systemic or intracerebral origin.  Oligoclonal bands are present in the CSF of more than  85% of patients with clinically definite multiple  sclerosis (MS). To distinguish between oligoclonal  bands in the CSF due to a peripheral gammopathy and  oligoclonal bands due to local production in the CNS,  serum and CSF should be tested simultaneously.  Oligoclonal bands can however be observed in a variety  of other diseases, e.g., subacute sclerosing panen-  cephalitis, inflammatory polyneuropathy, CNS lupus,  and brain tumors and infarctions. The clinical  significance of a numerical band count, determined  by isoelectric focusing, has not been definitively  defined. The data should be interpreted in conjunction  with all pertinent clinical and laboratory data for  this patient.   Resulting Agency [facility]  Narrative       Vitals: BP 125/75   Pulse 82   Ht 5\' 6"  (1.676 m)   Wt 172 lb (78 kg)   BMI 27.76 kg/m  Last Weight:  Wt Readings from Last 1 Encounters:  04/14/17 172 lb (78 kg)   HOZ:YYQM mass index is 27.76 kg/m.     Last Height:   Ht Readings from Last 1 Encounters:  04/14/17 5\' 6"  (1.676 m)    Physical exam:  General: The patient is awake, alert  and appears not in acute distress. The patient is well groomed. Head: Normocephalic, atraumatic. Neck is supple. Mallampati 2  neck circumference: 14.5 . Nasal airflow patent , Skin:  Without evidence of edema, or rash Trunk: BMI is 28,  The patient's posture is erect, she has hyperlordosis   Neurologic exam : The patient is awake and alert, oriented to place and time.     Attention span & concentration ability appears normal.  Speech is fluent,  without dysarthria, dysphonia or aphasia.  Mood and affect are appropriate.  Cranial nerves: Pupils are equal and briskly reactive to light. Funduscopic exam without evidence of pallor or edema.  No change in colour vision," red is  red " Loss of acuity, diplopia, she reports horizontal diplopia.  Extraocular movements  in vertical and horizontal planes intact and without nystagmus.  Visual fields by finger perimetry are intact.Hearing loss, evident in crowds and on the phone.  Facial sensation intact to fine touch. Facial motor strength is symmetric and tongue and uvula move midline. Shoulder shrug was symmetrical. There is mild titubation. NO LONGER DIPLOPIA> NEW GLASSES.  Motor exam:   Normal tone, muscle bulk and symmetric strength in all extremities. Good pinch strength, and excellent grip strength.  Toe movements were strong . The patient is very flat feet and smallish musculature for the hip quadriceps, hamstrings and gastrocnemius muscle Sensory:  Fine touch, pinprick and vibration were tested in all extremities. Proprioception tested in the upper extremities was normal. No pronator drift.  Coordination: Rapid alternating movements in the fingers/hands were normal, not slowed.  Finger-to-nose maneuver without evidence of ataxia, dysmetria and only mild , but low amplitude tremor. Her hand will suddenly jerk, change in handwriting towards smaller and smaller script.    Gait and station: Patient walks without assistive device and is able unassisted to climb up to the exam table. Strength within normal limits. Stance is stable and wide based. Her gait shows a tendency to sway, but turning is stable/Tandem gait remains fragmented. Turns still with 3  Steps.  Romberg testing is positive, patient can compensate for push from the back but not so easily for push from the chest, revealing a tendency of retropulsion.  Deep tendon reflexes: in the upper and lower extremities are symmetric and intact.   Established  MS Patient on Tacfidera as her first drug. I spent more than 25  minutes of face to face time with the patient and her spouse. Greater than 50% of time was spent in counseling and coordination of care. We have discussed  the diagnosis and differential and I answered the patient's questions.  tecd fiderae was well tolerated, after initial diarrhea.  She has Urinary urge incontinence- neurogenic bladder . She is interested in Tysabri, and I think she could be a good candidate.   Assessment:  After physical and neurologic examination, review of laboratory studies,  Personal review of imaging studies, reports of other /same  Imaging studies ,  Results of polysomnography/ neurophysiology testing and pre-existing records as far as provided in visit., my assessment is   1)  MS diagnosis , no progression in lesions from 01-2016 to now.   2) JCV positive, excluding TYSABRI.   Plan:  Treatment plan and additional workup :  Tacfidera has been well  tolerated,  No or slow progression of MS. She is interested in  OCREVUS .   OCREVUS panel requires  TB GOLD test/   Can start OCREVUS ASAP after that.    Porfirio Mylar  Zohaib Heeney MD  04/14/2017   CC: Marden Noble, Md [address] [place], [place] [address]

## 2017-04-14 NOTE — Patient Instructions (Signed)
Ocrelizumab injection What is this medicine? OCRELIZUMAB (ok re LIZ ue mab) treats multiple sclerosis. It helps to decrease the number of multiple sclerosis relapses. It is not a cure. This medicine may be used for other purposes; ask your health care provider or pharmacist if you have questions. COMMON BRAND NAME(S): OCREVUS What should I tell my health care provider before I take this medicine? They need to know if you have any of these conditions: -cancer -hepatitis B infection -other infection (especially a virus infection such as chickenpox, cold sores, or herpes) -an unusual or allergic reaction to ocrelizumab, other medicines, foods, dyes or preservatives -pregnant or trying to get pregnant -breast-feeding How should I use this medicine? This medicine is for infusion into a vein. It is given by a health care professional in a hospital or clinic setting. Talk to your pediatrician regarding the use of this medicine in children. Special care may be needed. Overdosage: If you think you have taken too much of this medicine contact a poison control center or emergency room at once. NOTE: This medicine is only for you. Do not share this medicine with others. What if I miss a dose? Keep appointments for follow-up doses as directed. It is important not to miss your dose. Call your doctor or health care professional if you are unable to keep an appointment. What may interact with this medicine? -alemtuzumab -daclizumab -dimethyl fumarate -fingolimod -glatiramer -interferon beta -live virus vaccines -mitoxantrone -natalizumab -peginterferon beta -rituximab -steroid medicines like prednisone or cortisone -teriflunomide This list may not describe all possible interactions. Give your health care provider a list of all the medicines, herbs, non-prescription drugs, or dietary supplements you use. Also tell them if you smoke, drink alcohol, or use illegal drugs. Some items may interact with  your medicine. What should I watch for while using this medicine? Tell your doctor or healthcare professional if your symptoms do not start to get better or if they get worse. This medicine can cause serious allergic reactions. To reduce your risk you may need to take medicine before treatment with this medicine. Take your medicine as directed. Women should inform their doctor if they wish to become pregnant or think they might be pregnant. There is a potential for serious side effects to an unborn child. Talk to your health care professional or pharmacist for more information. Female patients should use effective birth control methods while receiving this medicine and for 6 months after the last dose. Call your doctor or health care professional for advice if you get a fever, chills or sore throat, or other symptoms of a cold or flu. Do not treat yourself. This drug decreases your body's ability to fight infections. Try to avoid being around people who are sick. If you have a hepatitis B infection or a history of a hepatitis B infection, talk to your doctor. The symptoms of hepatitis B may get worse if you take this medicine. In some patients, this medicine may cause a serious brain infection that may cause death. If you have any problems seeing, thinking, speaking, walking, or standing, tell your doctor right away. If you cannot reach your doctor, urgently seek other source of medical care. This medicine can decrease the response to a vaccine. If you need to get vaccinated, tell your healthcare professional if you have received this medicine. Extra booster doses may be needed. Talk to your doctor to see if a different vaccination schedule is needed. Talk to your doctor about your risk of cancer.   You may be more at risk for certain types of cancers if you take this medicine. What side effects may I notice from receiving this medicine? Side effects that you should report to your doctor or health care  professional as soon as possible: -allergic reactions like skin rash, itching or hives, swelling of the face, lips, or tongue -breathing problems -facial flushing -fast, irregular heartbeat -lump or soreness in the breast -signs and symptoms of herpes such as cold sore, shingles, or genital sores -signs and symptoms of infection like fever or chills, cough, sore throat, pain or trouble passing urine -signs and symptoms of low blood pressure like dizziness; feeling faint or lightheaded, falls; unusually weak or tired -signs and symptoms of progressive multifocal leukoencephalopathy (PML) like changes in vision; clumsiness; confusion; personality changes; weakness on one side of the body -swelling of the ankles, feet, hands Side effects that usually do not require medical attention (report these to your doctor or health care professional if they continue or are bothersome): -back pain -depressed mood -diarrhea -pain, redness, or irritation at site where injected This list may not describe all possible side effects. Call your doctor for medical advice about side effects. You may report side effects to FDA at 1-800-FDA-1088. Where should I keep my medicine? This drug is given in a hospital or clinic and will not be stored at home. NOTE: This sheet is a summary. It may not cover all possible information. If you have questions about this medicine, talk to your doctor, pharmacist, or health care provider.  2018 Elsevier/Gold Standard (2015-04-17 09:40:25)  

## 2017-04-14 NOTE — Addendum Note (Signed)
Addended by: Melvyn Novas on: 04/14/2017 12:37 PM   Modules accepted: Orders

## 2017-04-15 ENCOUNTER — Telehealth: Payer: Self-pay | Admitting: Neurology

## 2017-04-15 NOTE — Telephone Encounter (Signed)
I called the pt and made her aware that I sent the script to [facility] for her tecfidera. It appears that the pharmacy received it around 1:15 on 4/2. I have instructed the pt to reach out to [facility] and see if they received it.  In the meantime we will wait to see what her lab work shows and then I will talk to Dr Vickey Huger about whether she will just follow up with [facility] for the ocrevus or if she has to be referred to Dr Epimenio Foot for this. Pt was inquiring about where she would get the medication and I informed her that she should be able to get it through our infusion lab here. Pt verbalized understanding and will wait til we know what her lab results show.

## 2017-04-15 NOTE — Telephone Encounter (Signed)
Pt states [facility] will not do her medications.  Pt needs to know if her new medication is by [employer] [facility] [employer].  If it is she can give RN Baird Lyons the phone # for [facility] it is [phone].  Pt is asking for a call back

## 2017-04-17 ENCOUNTER — Telehealth: Payer: Self-pay | Admitting: *Deleted

## 2017-04-17 LAB — QUANTIFERON-TB GOLD PLUS
QUANTIFERON TB1 AG VALUE: 0.01 [IU]/mL
QUANTIFERON-TB GOLD PLUS: NEGATIVE
QuantiFERON Mitogen Value: 0.89 IU/mL
QuantiFERON Nil Value: 0.02 IU/mL
QuantiFERON TB2 Ag Value: 0.01 IU/mL

## 2017-04-17 NOTE — Telephone Encounter (Signed)
Notes recorded by Melvyn Novas, MD on 04/17/2017 at 10:34 AM EDT All negative for Tuberculosis, ready for OCREVUS.

## 2017-04-19 ENCOUNTER — Encounter: Payer: Self-pay | Admitting: Neurology

## 2017-04-20 ENCOUNTER — Telehealth: Payer: Self-pay | Admitting: Neurology

## 2017-04-20 NOTE — Telephone Encounter (Signed)
-----   Message from Melvyn Novas, MD sent at 04/17/2017 10:34 AM EDT ----- All negative for Tuberculosis, ready for OCREVUS.

## 2017-04-20 NOTE — Telephone Encounter (Signed)
I have called the patient and made her aware that we have the green light to get the pt set up with ocrevus. I have informed the patient that she will have to come in and sign papers before we can get this started. The pt states that she will come by tomorrow afternoon. Once those papers are signed I can turn them into our infusion lab and we can get her set up for coming in. Pt verbalized understanding.

## 2017-05-14 ENCOUNTER — Other Ambulatory Visit: Payer: Self-pay | Admitting: Neurology

## 2017-05-18 ENCOUNTER — Telehealth: Payer: Self-pay | Admitting: Neurology

## 2017-05-18 NOTE — Telephone Encounter (Signed)
Molli Hazard with [facility] requesting a call back for clarification on if the pt has stopped Tacfidera. Please contact at [phone]

## 2017-05-18 NOTE — Telephone Encounter (Signed)
Called back and made them aware that patient has started a new infusion medication and will no longer need the tecfidera medication

## 2017-07-20 ENCOUNTER — Other Ambulatory Visit: Payer: Self-pay | Admitting: Internal Medicine

## 2017-07-20 DIAGNOSIS — Z1231 Encounter for screening mammogram for malignant neoplasm of breast: Secondary | ICD-10-CM

## 2017-07-23 ENCOUNTER — Ambulatory Visit: Payer: Medicare Other | Admitting: Internal Medicine

## 2017-08-11 ENCOUNTER — Ambulatory Visit: Payer: Medicare Other

## 2017-08-27 ENCOUNTER — Ambulatory Visit
Admission: RE | Admit: 2017-08-27 | Discharge: 2017-08-27 | Disposition: A | Discharge status: Home / Self Care | Payer: Medicare Other | Source: Ambulatory Visit | Attending: Internal Medicine | Admitting: Internal Medicine

## 2017-08-27 ENCOUNTER — Other Ambulatory Visit: Payer: Self-pay | Admitting: Internal Medicine

## 2017-08-27 DIAGNOSIS — Z1231 Encounter for screening mammogram for malignant neoplasm of breast: Secondary | ICD-10-CM

## 2017-08-27 NOTE — Telephone Encounter (Signed)
Please review for refill.  

## 2017-09-08 ENCOUNTER — Ambulatory Visit: Payer: Medicare Other | Admitting: Physician Assistant

## 2017-09-29 ENCOUNTER — Other Ambulatory Visit: Payer: Self-pay | Admitting: Internal Medicine

## 2017-09-29 NOTE — Telephone Encounter (Signed)
Refill Request.  

## 2017-10-07 ENCOUNTER — Encounter: Payer: Self-pay | Admitting: Physician Assistant

## 2017-10-07 ENCOUNTER — Ambulatory Visit: Payer: Medicare Other | Admitting: Physician Assistant

## 2017-10-07 VITALS — BP 120/84 | HR 70 | Ht 66.0 in | Wt 178.1 lb

## 2017-10-07 DIAGNOSIS — R002 Palpitations: Secondary | ICD-10-CM | POA: Diagnosis not present

## 2017-10-07 DIAGNOSIS — E782 Mixed hyperlipidemia: Secondary | ICD-10-CM | POA: Insufficient documentation

## 2017-10-07 DIAGNOSIS — I10 Essential (primary) hypertension: Secondary | ICD-10-CM | POA: Diagnosis not present

## 2017-10-07 MED ORDER — PROPRANOLOL HCL 10 MG PO TABS: 10.0000 mg | ORAL_TABLET | Freq: Two times a day (BID) | ORAL | 3 refills | Status: DC

## 2017-10-07 NOTE — Progress Notes (Signed)
Cardiology Office Note:    Date:  10/07/2017   ID:  Summer Holloway, DOB March 29, 1950, MRN [id number]  PCP:  Marden Noble, MD  Cardiologist:  Yvonne Kendall, MD  Electrophysiologist:  None  Neurologist:  Dr. Vickey Huger  Referring MD: Marden Noble, MD   Chief Complaint  Patient presents with  . Follow-up    palpitations    History of Present Illness:    Summer Holloway is a 67 y.o. female with multiple sclerosis, hypertension, hyperlipidemia, GERD, fibromyalgia.  She was evaluated by Dr. Okey Dupre last year for chest pain.  A Nuclear stress test was neg for ischemia.  She also has palpitations.  Her nuclear study suggested hyperdynamic LV function and her beta-blocker was changed from propranolol to carvedilol.     Summer Holloway returns for follow up.  She is here alone.  She is doing well.  She denies chest pain, shortness of breath, syncope, leg swelling.   Prior CV studies:   The following studies were reviewed today:  Nuclear stress test 10/07/16 Normal stress nuclear study with no ischemia or infarction; EF 66 with normal wall motion.   Nuclear stress test 06/28/08 No ischemia   Past Medical History:  Diagnosis Date  . Anxiety   . Bursitis of left hip   . Depression   . Esophageal reflux   . Fibromyalgia   . Hearing loss   . Hyperlipemia   . Hyperlipidemia   . Hypertension   . Multiple sclerosis (HCC)   . Osteoarthritis   . PVC (premature ventricular contraction)   . Sciatica   . Tinnitus    Surgical Hx: The patient  has a past surgical history that includes Partial hysterectomy; Cataract extraction, bilateral; Breast biopsy (05/24/2002); and Breast biopsy (Left, 05/30/2016).   Current Medications: Current Meds  Medication Sig  . ALPRAZolam (XANAX) 0.25 MG tablet TAKE 1 TABLET BY MOUTH EVERY DAY AS NEEDED FOR ANXIETY  . amitriptyline (ELAVIL) 50 MG tablet Take 25 mg by mouth at bedtime.  Marland Kitchen amLODipine (NORVASC) 5 MG tablet Take 5 mg by mouth daily.  Marland Kitchen aspirin EC 81  MG tablet Take 1 tablet (81 mg total) by mouth daily.  Marland Kitchen buPROPion (WELLBUTRIN XL) 150 MG 24 hr tablet Take 150 mg by mouth daily.  . DULoxetine (CYMBALTA) 60 MG capsule Take 60 mg by mouth daily.  Marland Kitchen escitalopram (LEXAPRO) 10 MG tablet Take 10 mg by mouth 2 (two) times daily.  Marland Kitchen estradiol (ESTRACE) 1 MG tablet Take 0.5 mg by mouth daily.   Marland Kitchen FLUoxetine (PROZAC) 40 MG capsule Take 40 mg by mouth daily.  Marland Kitchen HYDROcodone-acetaminophen (NORCO) 10-325 MG tablet Take 1 tablet by mouth every 12 (twelve) hours as needed.  Marland Kitchen levothyroxine (SYNTHROID, LEVOTHROID) 50 MCG tablet Take 50 mcg by mouth daily before breakfast.  . ocrelizumab (OCREVUS) 300 MG/10ML injection Inject 600 mg into the vein every 6 (six) months.  . pantoprazole (PROTONIX) 40 MG tablet Take 40 mg by mouth daily.  . pregabalin (LYRICA) 100 MG capsule Take 100 mg by mouth. 2 capsules in am and 1 capsule in pm   . rosuvastatin (CRESTOR) 5 MG tablet Take 5 mg by mouth daily.  . [DISCONTINUED] carvedilol (COREG) 6.25 MG tablet Take 1 tablet (6.25 mg total) by mouth 2 (two) times daily. Please keep upcoming appt in September for future refills. Thank you     Allergies:   Patient has no known allergies.   Social History   Tobacco Use  . Smoking status:  Never Smoker  . Smokeless tobacco: Never Used  Substance Use Topics  . Alcohol use: No  . Drug use: Not on file     Family Hx: The patient's family history includes Anxiety disorder in her mother; CAD in her father; Fibromyalgia in her mother; Heart attack in her cousin; Heart attack (age of onset: 43) in her father.  ROS:   Please see the history of present illness.    Review of Systems  Eyes: Positive for visual disturbance.  Genitourinary: Positive for incomplete emptying.   All other systems reviewed and are negative.   EKGs/Labs/Other Test Reviewed:    EKG:  EKG is   ordered today.  The ekg ordered today demonstrates normal sinus rhythm ,HR 70, normal axis, QTc 429  ms  Recent Labs: 03/24/2017: ALT 10; BUN 15; Creatinine, Ser 0.88; Hemoglobin 14.6; Platelets 368; Potassium 4.7; Sodium 142   Recent Lipid Panel No results found for: CHOL, TRIG, HDL, CHOLHDL, LDLCALC, LDLDIRECT   From [facility] Cholesterol, total 181.000 08/13/2017 HDL 54.000 08/13/2017 LDL 103.000 08/13/2017 Triglycerides 121.000 08/13/2017 Hemoglobin 13.200 08/13/2017 Creatinine, Serum 0.790 08/13/2017 Potassium 4.700 08/13/2017 ALT (SGPT) 8.000 08/13/2017 TSH 1.450 08/13/2017 Platelets 368.000 03/24/2017  Physical Exam:    VS:  BP 120/84   Pulse 70   Ht 5\' 6"  (1.676 m)   Wt 178 lb 1.9 oz (80.8 kg)   SpO2 97%   BMI 28.75 kg/m     Wt Readings from Last 3 Encounters:  10/07/17 178 lb 1.9 oz (80.8 kg)  04/14/17 172 lb (78 kg)  03/24/17 172 lb (78 kg)     Physical Exam  Constitutional: She is oriented to person, place, and time. She appears well-developed and well-nourished. No distress.  HENT:  Head: Normocephalic and atraumatic.  Eyes: No scleral icterus.  Neck: No JVD present.  Cardiovascular: Normal rate and regular rhythm.  No murmur heard. Pulmonary/Chest: Effort normal. She has no rales.  Abdominal: Soft.  Musculoskeletal: She exhibits no edema.  Neurological: She is alert and oriented to person, place, and time.  Skin: Skin is warm and dry.    ASSESSMENT & PLAN:    Palpitations  She was evaluated by Dr. Patty Sermons years ago and diagnosed with PVCs.  She notes that she had better control of her PVCs with Propranolol than Carvedilol.  She would like to change back to Propranolol.   -DC Carvedilol  -Start Propranolol 10 mg Twice daily   Essential hypertension   BP is controlled.  As noted, she will change Carvedilol to Propranolol.  I have asked her to monitor her blood pressure.  If is runs above target (>130/80), she should call so we can adjust her medications.  She has a FHx of CAD and would like to continue to follow up with Dr. Okey Dupre in [place] on a yearly basis.      Dispo:  Return in about 1 year (around 10/08/2018) for Routine Follow Up, w/ Dr. Okey Dupre in [place].    Medication Adjustments/Labs and Tests Ordered: Current medicines are reviewed at length with the patient today.  Concerns regarding medicines are outlined above.  Tests Ordered: Orders Placed This Encounter  Procedures  . EKG 12-Lead   Medication Changes: Meds ordered this encounter  Medications  . propranolol (INDERAL) 10 MG tablet    Sig: Take 1 tablet (10 mg total) by mouth 2 (two) times daily.    Dispense:  180 tablet    Refill:  3    Order Specific Question:  Supervising Provider    Answer:   Wendall Stade [[id number]]    Signed, Tereso Newcomer, PA-C  10/07/2017 3:38 PM    [facility] [address] [place], [place], [place]  [address] Phone: [phone]; Fax: [phone]

## 2017-10-07 NOTE — Patient Instructions (Addendum)
Medication Instructions:  Stop taking Coreg (Carvedilol)  Start Propranolol 10 mg Twice daily   Labwork: None   Testing/Procedures: None   Follow-Up: Yvonne Kendall, MD in 1 year in [place].  Any Other Special Instructions Will Be Listed Below (If Applicable).  If you need a refill on your cardiac medications before your next appointment, please call your pharmacy.

## 2017-11-18 ENCOUNTER — Other Ambulatory Visit: Payer: Self-pay | Admitting: Neurology

## 2017-11-18 ENCOUNTER — Telehealth: Payer: Self-pay | Admitting: Neurology

## 2017-11-18 MED ORDER — AMPHETAMINE-DEXTROAMPHETAMINE 10 MG PO TABS: 10.0000 mg | ORAL_TABLET | ORAL | 0 refills | Status: DC | PRN

## 2017-11-18 NOTE — Telephone Encounter (Signed)
Called the patient back and informed her that I discussed her concern with Dr Vickey Huger. Dr Dohmeier states this is a common side effect that she hears in comparison to tecfidera. Dr Dohmeier was not opposed to the pt going back to [place] however she feels that Ocrevus is the better medication to manage her MS. She would first offer the patient to continue taking the Ocrevus but add a medication such as Adderall for her to take in the morning to help hopefully increase her energy. The patient was agreeable to this. I reviewed common side effects with the patient in regards to stimulant medication. Encouraged the patient to not drink caffeine and she verbalized understanding. Informed the patient to keep [place] updated on if she has any concerns and let us know if this is helping. Pt verbalized understanding and was very appreciative.

## 2017-11-18 NOTE — Telephone Encounter (Signed)
Called the patient to discuss her call. Patient stated that since starting the ocrevus she has felt lack of energy and harder to get out of the bed since taken the ocrevus. Her and her husband both felt that she tolerated and did much better when she was taken the tecfidera. I informed the patient I will make Dr Dohmeier aware of her concern and see what recommendations she has or if she thinks patient should restart Tecfidera. Informed the patient I would call back after discussing with Dr Vickey Huger. Pt verbalized understanding and was appreciative for the call.

## 2017-11-18 NOTE — Telephone Encounter (Signed)
Trial of adderall- she is on too many medications that I would consider sleepy making, the MS fatigue may be a minor part of the problem.  Send Adderall 10 mg, not for daily use , but for use when most fatigued.   She needs to speak to her other prescribing physicians about pain medication, antidepressants and benzodiazepines and hydrocodone.

## 2017-11-18 NOTE — Telephone Encounter (Signed)
Pt called stating she feels overall she felt better while on tecfidera, Stating that while on ocrevus she has noticed a lack of energy. Requesting a call to discuss

## 2017-11-26 NOTE — Telephone Encounter (Signed)
Called the patient and was able to talk with her. She said the medication is helping so much. Advised her it was perfectly fine to cut the pill in half and see if that still works for her. Pt verbalized understanding and was appreciative for the call back.

## 2017-11-26 NOTE — Telephone Encounter (Signed)
Pt has called back to inform how she is doing on the amphetamine-dextroamphetamine (ADDERALL) 10 MG tablet: pt states the only thing she can say is it really has enhanced her mood.  Which means that much is doing a great job, her stamina has been greatly improved, almost too much so.  Pt states has upted her activity, almost too much.  The medication has worked, pt wants to know if she can be cut back on dosage a little bit, please call.  Pt said now she has gone from sleeping 12 hours to sleeping about 6 hours

## 2018-01-08 ENCOUNTER — Other Ambulatory Visit (INDEPENDENT_AMBULATORY_CARE_PROVIDER_SITE_OTHER): Payer: Self-pay

## 2018-01-08 DIAGNOSIS — G35 Multiple sclerosis: Secondary | ICD-10-CM

## 2018-01-08 DIAGNOSIS — Z0289 Encounter for other administrative examinations: Secondary | ICD-10-CM

## 2018-01-14 ENCOUNTER — Other Ambulatory Visit: Payer: Self-pay | Admitting: Neurology

## 2018-01-20 ENCOUNTER — Telehealth: Payer: Self-pay | Admitting: Neurology

## 2018-01-20 NOTE — Telephone Encounter (Signed)
Called the patient to advise the results from her JCV came back. JCV + 1.57. This is down from 1.77. patient is to continue to with ocrevus for now. Pt verbalized understanding. Patient did mention that she has a harder time around last  3-4 wks  Prior to the dose. She wanted to make sure I continue to note this.

## 2018-03-26 ENCOUNTER — Other Ambulatory Visit: Payer: Self-pay | Admitting: Neurology

## 2018-05-20 ENCOUNTER — Telehealth: Payer: Self-pay | Admitting: Neurology

## 2018-05-20 NOTE — Telephone Encounter (Signed)
Received a letter from [employer] pt  ID # [id number] As of 5/7 enrollment in PAN MS fund was terminated due to non compliant. ACT was closed

## 2018-06-05 ENCOUNTER — Other Ambulatory Visit: Payer: Self-pay | Admitting: Neurology

## 2018-06-10 ENCOUNTER — Telehealth: Payer: Self-pay | Admitting: Neurology

## 2018-06-10 ENCOUNTER — Encounter: Payer: Self-pay | Admitting: Neurology

## 2018-06-10 NOTE — Telephone Encounter (Signed)
Called the patient to inform them that our office has placed new protocols in place for our office visits. Due to Covid 19 our office is reducing our number of office visits in order to minimize the risk to our patients and healthcare providers.Our office is now providing the capability to offer the patients virtual visits at this time. Informed of what that process looks like and informed that the Virtual visit will still be billed through insurance as such. Due to [place],informed the patient since the appointment is taking place over the phone/internet app, we can't guarantee the security of the phone line. With that said if we do move forward I would have to get verbal consent to complete the Video Visit/Phone call. Patient gave verbal consent to move forward with the video visit. I have reviewed the patient's chart and made sure that everything is up to date. Patient is also made aware that since this is a video visit we are able to complete the visit but a physical exam is not able to be done since the patient is not present in person. Pt request the link be texted/emailed to them. Pt informed that the front staff will contact the patient aprox 30 minutes prior to the scheduled appointment to "check them in" and make sure that everything is ready for the appointment to get started. Pt verbalized understanding of this information and will states to be ready for the visit at least 15-30 min prior to the visit. Reminded the patient once more that this is treated as a Office visit and the patient must be prepared for the visit and ready at the time of their appointment preferably in a well lit area where they have good connection for the visit. Pt verbalized understanding.   Sent email to [email or web address]

## 2018-06-17 ENCOUNTER — Encounter: Payer: Self-pay | Admitting: Family Medicine

## 2018-06-17 ENCOUNTER — Ambulatory Visit (INDEPENDENT_AMBULATORY_CARE_PROVIDER_SITE_OTHER): Payer: Medicare Other | Admitting: Family Medicine

## 2018-06-17 ENCOUNTER — Other Ambulatory Visit: Payer: Self-pay

## 2018-06-17 DIAGNOSIS — G35 Multiple sclerosis: Secondary | ICD-10-CM

## 2018-06-17 NOTE — Progress Notes (Signed)
PATIENT: Summer Holloway DOB: 31-Mar-1950  REASON FOR VISIT: follow up HISTORY FROM: patient  Virtual Visit via Telephone Note  I connected with Summer Holloway on 06/17/18 at  2:00 PM EDT by telephone and verified that I am speaking with the correct person using two identifiers.   I discussed the limitations, risks, security and privacy concerns of performing an evaluation and management service by telephone and the availability of in person appointments. I also discussed with the patient that there may be a patient responsible charge related to this service. The patient expressed understanding and agreed to proceed.   History of Present Illness:  06/17/18 Summer Holloway is a 68 y.o. female here today for follow up of MS. She reports that she is doing fairly well since her last visit.  She continues Ocrevus infusions every 6 months.  Next infusion scheduled for June 24.  She does note some mild increase in numbness of her feet which she feels has changed her gait slightly.  She continues Lyrica 100 mg twice daily.  She is also taking Cymbalta 60 mg daily.  She reports that Adderall 5 mg daily helps significantly with fatigue levels.  She denies any falls.  She had bladder and uterine prolapse repair the first of the year that has significantly helped with urine incontinence.  She denies any new numbness or new symptoms.  Last MRI was in October 2018.  She is requesting to hold off on MRI at this time she does not feel it safe.   Observations/Objective:  Generalized: Well developed, in no acute distress  Mentation: Alert oriented to time, place, history taking. Follows all commands speech and language fluent   Assessment and Plan:  68 y.o. year old female  has a past medical history of Anxiety, Bursitis of left hip, Depression, Esophageal reflux, Fibromyalgia, Hearing loss, Hyperlipemia, Hyperlipidemia, Hypertension, Multiple sclerosis (HCC), Osteoarthritis, PVC (premature ventricular  contraction), Sciatica, Tinnitus, and Uterine prolapse. here with    ICD-10-CM   1. MS (multiple sclerosis) (HCC) G35 CMP    CBC with Differential/Platelets   Overall Summer Holloway feels that she is fairly stable since her last visit.  She does have slightly worsening neuropathy but does not feel it significant at this time.  No new numbness or symptoms.  She is requesting that we wait until her next follow-up to repeat MRI.  She is trying to avoid any unnecessary visits.  We will order labs to be performed on date of infusion.  She is tolerating her medications well.  We will continue current therapy.  I have advised 88-month follow-up.  She verbalizes understanding and agreement with this plan.  Orders Placed This Encounter  Procedures  . CMP    Standing Status:   Future    Standing Expiration Date:   06/17/2019  . CBC with Differential/Platelets    Standing Status:   Future    Standing Expiration Date:   06/17/2019    No orders of the defined types were placed in this encounter.    Follow Up Instructions:  I discussed the assessment and treatment plan with the patient. The patient was provided an opportunity to ask questions and all were answered. The patient agreed with the plan and demonstrated an understanding of the instructions.   The patient was advised to call back or seek an in-person evaluation if the symptoms worsen or if the condition fails to improve as anticipated.  I provided 20 minutes of non-face-to-face time during this  encounter. Video conference converted to teleconference due to technical difficulties. Patient is located at her place of residence during teleconference. Provider is in the office. Rozell Searing, CMA helped to facilitate visit.    Shawnie Dapper, NP

## 2018-06-24 ENCOUNTER — Telehealth: Payer: Self-pay

## 2018-06-24 NOTE — Telephone Encounter (Signed)
Unable to get in contact with the patient to schedule her 6 month follow up with Dr. Brett Fairy. There was no option to leave a voicemail. I will call back later today.   Patient's next infusion is scheduled for 07/07/2018 @ 10 am per the nurse in the intrafusion suite.

## 2018-07-14 ENCOUNTER — Other Ambulatory Visit: Payer: Self-pay | Admitting: Internal Medicine

## 2018-07-14 DIAGNOSIS — Z1231 Encounter for screening mammogram for malignant neoplasm of breast: Secondary | ICD-10-CM

## 2018-08-07 ENCOUNTER — Other Ambulatory Visit: Payer: Self-pay | Admitting: Neurology

## 2018-08-09 ENCOUNTER — Other Ambulatory Visit: Payer: Self-pay | Admitting: Neurology

## 2018-08-09 ENCOUNTER — Other Ambulatory Visit: Payer: Self-pay | Admitting: *Deleted

## 2018-08-09 MED ORDER — AMPHETAMINE-DEXTROAMPHETAMINE 10 MG PO TABS: 10.0000 mg | ORAL_TABLET | Freq: Every day | ORAL | 0 refills | Status: DC | PRN

## 2018-08-31 ENCOUNTER — Other Ambulatory Visit: Payer: Self-pay

## 2018-08-31 ENCOUNTER — Ambulatory Visit
Admission: RE | Admit: 2018-08-31 | Discharge: 2018-08-31 | Disposition: A | Discharge status: Home / Self Care | Payer: Medicare Other | Source: Ambulatory Visit | Attending: Internal Medicine | Admitting: Internal Medicine

## 2018-08-31 DIAGNOSIS — Z1231 Encounter for screening mammogram for malignant neoplasm of breast: Secondary | ICD-10-CM

## 2018-09-01 ENCOUNTER — Other Ambulatory Visit: Payer: Self-pay | Admitting: Internal Medicine

## 2018-09-01 DIAGNOSIS — R928 Other abnormal and inconclusive findings on diagnostic imaging of breast: Secondary | ICD-10-CM

## 2018-09-02 ENCOUNTER — Ambulatory Visit
Admission: RE | Admit: 2018-09-02 | Discharge: 2018-09-02 | Disposition: A | Discharge status: Home / Self Care | Payer: Medicare Other | Source: Ambulatory Visit | Attending: Internal Medicine | Admitting: Internal Medicine

## 2018-09-02 ENCOUNTER — Other Ambulatory Visit: Payer: Self-pay | Admitting: Internal Medicine

## 2018-09-02 ENCOUNTER — Other Ambulatory Visit: Payer: Self-pay

## 2018-09-02 DIAGNOSIS — R928 Other abnormal and inconclusive findings on diagnostic imaging of breast: Secondary | ICD-10-CM

## 2018-09-02 DIAGNOSIS — R921 Mammographic calcification found on diagnostic imaging of breast: Secondary | ICD-10-CM

## 2018-09-06 ENCOUNTER — Telehealth: Payer: Self-pay | Admitting: Neurology

## 2018-09-06 MED ORDER — TRAMADOL HCL 50 MG PO TABS: 50.0000 mg | ORAL_TABLET | Freq: Four times a day (QID) | ORAL | 1 refills | Status: DC | PRN

## 2018-09-06 NOTE — Telephone Encounter (Signed)
I cannot use hydrocodone for chronic pain anymore, but I could provide Tramadol for a short period. I prescribed Tramadol.

## 2018-09-06 NOTE — Telephone Encounter (Signed)
Pt is calling in stating she is having a lot of pain from her MS , she states she use to be able to keep her pain under control with Excedrin and Aleve but its no longer working. She stated she was given something in the past by Dr Brett Fairy  that helped with the pain and she would like to be placed on something for the pain

## 2018-09-07 ENCOUNTER — Other Ambulatory Visit: Payer: Self-pay | Admitting: Neurology

## 2018-09-07 NOTE — Addendum Note (Signed)
Addended by: Darleen Crocker on: 09/07/2018 08:48 AM   Modules accepted: Orders

## 2018-09-07 NOTE — Telephone Encounter (Signed)
Called the patient and made her aware that Dr Brett Fairy has sent in temp supply of tramadol that is to last her a month. Advised the patient it was sent to pill pack. Patient is asking that it is sent to local pharmacy. I have resent the request for Dr Dohmeier to send again for her. Patient states she will give that a try, in the past it had previously not helped much but she is willing to see if it works. Advised the pt that at the upcoming apt we can discuss if need to make changes. Pt verbalized understanding.

## 2018-09-08 MED ORDER — TRAMADOL HCL 50 MG PO TABS: 50.0000 mg | ORAL_TABLET | Freq: Two times a day (BID) | ORAL | 1 refills | Status: DC | PRN

## 2018-09-19 ENCOUNTER — Other Ambulatory Visit: Payer: Self-pay | Admitting: Physician Assistant

## 2018-10-07 ENCOUNTER — Other Ambulatory Visit: Payer: Self-pay | Admitting: *Deleted

## 2018-10-07 MED ORDER — AMPHETAMINE-DEXTROAMPHETAMINE 10 MG PO TABS: 10.0000 mg | ORAL_TABLET | Freq: Every day | ORAL | 0 refills | Status: DC | PRN

## 2018-10-07 NOTE — Telephone Encounter (Signed)
Drug registry checked, Adderall 10mg  po daily prn.  Last fill 08-09-18 #30.

## 2018-10-12 ENCOUNTER — Telehealth: Payer: Self-pay | Admitting: *Deleted

## 2018-10-12 NOTE — Telephone Encounter (Signed)
Tried to call patient to let her know Summer Holloway will be onsite tomorrow morning and see if they would like to be seen in person but the voicemail box is full.  If she calls back please give the option of onsite and if sh wants to remain virtual move to the afternoon during virtual time.

## 2018-10-12 NOTE — Progress Notes (Signed)
Virtual Visit via Telephone Note   This visit type was conducted due to national recommendations for restrictions regarding the COVID-19 Pandemic (e.g. social distancing) in an effort to limit this patient's exposure and mitigate transmission in our community.  Due to her co-morbid illnesses, this patient is at least at moderate risk for complications without adequate follow up.  This format is felt to be most appropriate for this patient at this time.  The patient did not have access to video technology/had technical difficulties with video requiring transitioning to audio format only (telephone).  All issues noted in this document were discussed and addressed.  No physical exam could be performed with this format.  Please refer to the patient's chart for her  consent to telehealth for [facility].   Date:  10/13/2018   ID:  Laurina Bustle, DOB 03-02-1950, MRN [id number]  Patient Location: Home Provider Location: Office  PCP:  Josetta Huddle, MD  Cardiologist:  Nelva Bush, MD  >> she has decided to remain at [facility] (will see Richardson Dopp, PA-C) Electrophysiologist:  None   Evaluation Performed:  Follow-Up Visit  Chief Complaint:  Palpitations, HTN  History of Present Illness:    Navayah Sok is a 68 y.o. female with:  Multiple Sclerosis  Hypertension   Hyperlipidemia  GERD  Fibromyalgia  Chest pain   Myoview 9/18: no ischemia  Palpitations; hx of PVCs  Managed with beta-blocker   She was last seen in September 2019.  Today, she notes she is doing well without chest pain, shortness of breath, syncope.  Her palpitations are well controlled on beta-blocker therapy.  The patient does not have symptoms concerning for COVID-19 infection (fever, chills, cough, or new shortness of breath).    Past Medical History:  Diagnosis Date  . Anxiety   . Bursitis of left hip   . Depression   . Esophageal reflux   . Fibromyalgia   . Hearing loss   .  Hyperlipemia   . Hyperlipidemia   . Hypertension   . Multiple sclerosis ([place])   . Osteoarthritis   . PVC (premature ventricular contraction)   . Sciatica   . Tinnitus   . Uterine prolapse    Past Surgical History:  Procedure Laterality Date  . BREAST BIOPSY  05/24/2002  . BREAST BIOPSY Left 05/30/2016   negative  . CATARACT EXTRACTION, BILATERAL    . PARTIAL HYSTERECTOMY    . surgical mesh     of uterine     Current Meds  Medication Sig  . ALPRAZolam (XANAX) 0.25 MG tablet TAKE 1 TABLET BY MOUTH EVERY DAY AS NEEDED FOR ANXIETY  . amitriptyline (ELAVIL) 50 MG tablet Take 25 mg by mouth at bedtime.  Marland Kitchen amLODipine (NORVASC) 5 MG tablet Take 5 mg by mouth daily.  Marland Kitchen amphetamine-dextroamphetamine (ADDERALL) 10 MG tablet Take 5 mg by mouth daily as needed (enegry).  Marland Kitchen aspirin EC 81 MG tablet Take 1 tablet (81 mg total) by mouth daily.  Marland Kitchen buPROPion (WELLBUTRIN XL) 150 MG 24 hr tablet Take 150 mg by mouth daily.  . DULoxetine (CYMBALTA) 60 MG capsule Take 60 mg by mouth daily.  Marland Kitchen levothyroxine (SYNTHROID, LEVOTHROID) 50 MCG tablet Take 50 mcg by mouth daily before breakfast.  . ocrelizumab (OCREVUS) 300 MG/10ML injection Inject 600 mg into the vein every 6 (six) months.  . pantoprazole (PROTONIX) 40 MG tablet Take 40 mg by mouth daily.  . pregabalin (LYRICA) 100 MG capsule Take 100 mg by mouth 2 (  two) times daily.   . propranolol (INDERAL) 10 MG tablet Take 10 mg by mouth daily.  . rosuvastatin (CRESTOR) 5 MG tablet Take 5 mg by mouth daily.  . traMADol (ULTRAM) 50 MG tablet Take 1 tablet (50 mg total) by mouth every 12 (twelve) hours as needed.     Allergies:   Patient has no known allergies.   Social History   Tobacco Use  . Smoking status: Never Smoker  . Smokeless tobacco: Never Used  Substance Use Topics  . Alcohol use: No  . Drug use: Not Currently     Family Hx: The patient's family history includes Anxiety disorder in her mother; CAD in her father; Fibromyalgia in  her mother; Heart attack in her cousin; Heart attack (age of onset: 73) in her father.    Prior CV studies:   The following studies were reviewed today:  Nuclear stress test 10/07/16 Normal stress nuclear study with no ischemia or infarction; EF 66 with normal wall motion.   Nuclear stress test 06/28/08 No ischemia  Labs/Other Tests and Data Reviewed:    EKG:  No ECG reviewed.  Recent Labs: No results found for requested labs within last 8760 hours.   Recent Lipid Panel No results found for: CHOL, TRIG, HDL, CHOLHDL, LDLCALC, LDLDIRECT    Wt Readings from Last 3 Encounters:  10/13/18 175 lb (79.4 kg)  10/07/17 178 lb 1.9 oz (80.8 kg)  04/14/17 172 lb (78 kg)     Objective:    Vital Signs:  BP 131/83   Pulse 75   Ht 5\' 6"  (1.676 m)   Wt 175 lb (79.4 kg)   BMI 28.25 kg/m    VITAL SIGNS:  reviewed GEN:  no acute distress RESPIRATORY:  no labored breathing noted NEURO:  alert and oriented PSYCH:  normal mood  ASSESSMENT & PLAN:    1. Essential hypertension The patient's blood pressure is controlled on her current regimen.  Continue current therapy.    2. Palpitations Controlled on beta-blocker therapy.  Continue current management.    Time:   Today, I have spent 10 minutes with the patient with telehealth technology discussing the above problems.     Medication Adjustments/Labs and Tests Ordered: Current medicines are reviewed at length with the patient today.  Concerns regarding medicines are outlined above.   Tests Ordered: No orders of the defined types were placed in this encounter.   Medication Changes: No orders of the defined types were placed in this encounter.   Follow Up:  Virtual Visit or In Person in 1 year(s)   Signed, , PA-C  10/13/2018 11:32 AM    [facility]

## 2018-10-13 ENCOUNTER — Telehealth: Payer: Self-pay

## 2018-10-13 ENCOUNTER — Other Ambulatory Visit: Payer: Self-pay

## 2018-10-13 ENCOUNTER — Telehealth (INDEPENDENT_AMBULATORY_CARE_PROVIDER_SITE_OTHER): Payer: Medicare Other | Admitting: Physician Assistant

## 2018-10-13 ENCOUNTER — Ambulatory Visit: Payer: Medicare Other | Admitting: Internal Medicine

## 2018-10-13 VITALS — BP 131/83 | HR 75 | Ht 66.0 in | Wt 175.0 lb

## 2018-10-13 DIAGNOSIS — I10 Essential (primary) hypertension: Secondary | ICD-10-CM | POA: Diagnosis not present

## 2018-10-13 DIAGNOSIS — R002 Palpitations: Secondary | ICD-10-CM

## 2018-10-13 NOTE — Patient Instructions (Signed)
Medication Instructions:  Your physician recommends that you continue on your current medications as directed. Please refer to the Current Medication list given to you today.  If you need a refill on your cardiac medications before your next appointment, please call your pharmacy.   Lab work: None   If you have labs (blood work) drawn today and your tests are completely normal, you will receive your results only by: Marland Kitchen MyChart Message (if you have MyChart) OR . A paper copy in the mail If you have any lab test that is abnormal or we need to change your treatment, we will call you to review the results.  Testing/Procedures: None   Follow-Up: Your physician wants you to follow-up in: 12 months with Richardson Dopp PA-C You will receive a reminder letter in the mail two months in advance. If you don't receive a letter, please call our office to schedule the follow-up appointment.   Any Other Special Instructions Will Be Listed Below (If Applicable).

## 2018-10-13 NOTE — Telephone Encounter (Signed)

## 2018-10-16 ENCOUNTER — Other Ambulatory Visit: Payer: Self-pay | Admitting: Physician Assistant

## 2018-10-18 ENCOUNTER — Telehealth: Payer: Self-pay | Admitting: Family Medicine

## 2018-10-18 NOTE — Telephone Encounter (Signed)
Pt is having pain in her right hip and wants to know if this is a symptom of MS, pt is requesting a call back

## 2018-10-18 NOTE — Telephone Encounter (Signed)
I called pt.  She has R hip to butt pain started about 2-3 months ago.  Gets up no problem, then will start to gradually get worse.  No trauma, not exercise regimen.  Aleve and excedrin no help.  Has some weakness in that foot, feels that is worse.   Ocrevus 12/20 due.  Has UTI, urine incontinence/ urgency/ burning.  (txtd with one ABX may need another).  I made appt with AL/NP and will 10-25-18 at 0900 to evaluate.  Has pcp earlier appt with pcp, if they address, then will cancel ours.  Pt verbalized understanding.

## 2018-10-25 ENCOUNTER — Other Ambulatory Visit: Payer: Self-pay

## 2018-10-25 ENCOUNTER — Ambulatory Visit: Payer: Medicare Other | Admitting: Family Medicine

## 2018-10-25 ENCOUNTER — Telehealth: Payer: Self-pay | Admitting: Family Medicine

## 2018-10-25 ENCOUNTER — Encounter: Payer: Self-pay | Admitting: Family Medicine

## 2018-10-25 VITALS — BP 112/90 | HR 87 | Temp 98.0°F | Ht 67.0 in | Wt 185.4 lb

## 2018-10-25 DIAGNOSIS — G8929 Other chronic pain: Secondary | ICD-10-CM

## 2018-10-25 DIAGNOSIS — M898X1 Other specified disorders of bone, shoulder: Secondary | ICD-10-CM

## 2018-10-25 DIAGNOSIS — M4807 Spinal stenosis, lumbosacral region: Secondary | ICD-10-CM

## 2018-10-25 DIAGNOSIS — M5416 Radiculopathy, lumbar region: Secondary | ICD-10-CM

## 2018-10-25 DIAGNOSIS — M4804 Spinal stenosis, thoracic region: Secondary | ICD-10-CM

## 2018-10-25 DIAGNOSIS — M5030 Other cervical disc degeneration, unspecified cervical region: Secondary | ICD-10-CM | POA: Diagnosis not present

## 2018-10-25 DIAGNOSIS — R413 Other amnesia: Secondary | ICD-10-CM

## 2018-10-25 DIAGNOSIS — G35A Relapsing-remitting multiple sclerosis: Secondary | ICD-10-CM

## 2018-10-25 DIAGNOSIS — G35 Multiple sclerosis: Secondary | ICD-10-CM | POA: Diagnosis not present

## 2018-10-25 DIAGNOSIS — G35D Multiple sclerosis, unspecified: Secondary | ICD-10-CM

## 2018-10-25 MED ORDER — PREDNISONE 10 MG (21) PO TBPK: ORAL_TABLET | ORAL | 0 refills | Status: DC

## 2018-10-25 NOTE — Telephone Encounter (Signed)
[employer] medicare order sent to GI. No auth they will reach out to the patient to schedule.  

## 2018-10-25 NOTE — Progress Notes (Signed)
PATIENT: Summer Holloway DOB: [birth year]  REASON FOR VISIT: follow up HISTORY FROM: patient  Chief Complaint  Patient presents with  . Follow-up    Room 2,alone. MS f/u. "Hand shaking, Burning on shoulder, lazy right eye, memory, fatigue and weakness right leg. Pain in lower back/hip/right buttock."     HISTORY OF PRESENT ILLNESS: Today 10/25/18 Summer Holloway is a 68 y.o. female here today for follow up. She has MS currently treated with Ocrevus biannually.  Last infusion 7/24 and next infusion in December.She has had worsening of right hip/ mid buttuck pain for 2-3 months. Pain is described as achy/throbbing pain. It is better with rest and worse with walking. It does not radiate. She does feel that her right leg is weaker at baseline but seems to be worsening. She feels that she is walking differently. 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 She does not use any assistive devices. She has a hard time taking a shower. She is having left sided scapular burning. Burning is not new, going on for years.  No numbness. She has a lazy right eye. She got new corrective lenses that has helped. No eye pain or vision changes. She was treated with unknown abx for UTI by PCP two weeks ago.  She called last week with continued symptoms and was started on amoxicillin that seems to be helping. She has a history of left trochanteric bursitis, DDD and sciatica. MRI cervical spine in 10/2016 showed moderate spinal stenosis at C4-5, mild at C5-C6 and mild to moderate at C6-C7. MRI of thoracic spine in 6/15/20017 showed large disc protrusion at T11-T12 on the right that contributes to cord flattening and mild to moderate stenosis. MRI lumbar spine in 06/2015 showed chronic degenerative disc disease at L1-S1 with disc bulging and endplate osteophytes contributing to mild narrowing of lateral recesses and foramina. She was previously seen by Sheran Luz for back pain. She  has not been seen in a while and would prefer to see someone for a second opinion. She feels that memory continues to decline. She has trouble with short term memory. She is able to perform all ADL's She does not get lost while driving.    HISTORY: (copied from my note on 06/17/2018)  Summer Holloway is a 68 y.o. female here today for follow up of MS. She reports that she is doing fairly well since her last visit.  She continues Ocrevus infusions every 6 months.  Next infusion scheduled for June 24.  She does note some mild increase in numbness of her feet which she feels has changed her gait slightly.  She continues Lyrica 100 mg twice daily.  She is also taking Cymbalta 60 mg daily.  She reports that Adderall 5 mg daily helps significantly with fatigue levels.  She denies any falls.  She had bladder and uterine prolapse repair the first of the year that has significantly helped with urine incontinence.  She denies any new numbness or new symptoms.  Last MRI was in October 2018.  She is requesting to hold off on MRI at this time she does not feel it safe.  History (copied from Dr Dohmeier's note on 04/14/2017)  HPI:  Summer Holloway is a 68 y.o. female , seen here in a  revisit  ( from Dr. Kevan Ny)  for MS follow up.  Interval history, 04-14-2017,   JCV was psitive and Tysabri is not longer a  preferred infusion therapy. We discussed changing to OCREVUS, she agrees. I will give her the necessary papers, information.  The patient has had TB testing, CMET and CBC diff. but I will need to add GOLD TB test.   Interval history from 24 March 2017.  I have the pleasure of meeting today with Summer Holloway and her husband.  The patient has been well controlled on Tecfidera used in the treatment of relapsing remitting multiple sclerosis.  The most recent MRI from October 22, 2016 showed within the brain multiple T2 and FLAIR hyperintense foci, in the cerebellar and middle cerebellar peduncle. There is also a focus in  the left posterior pons.  Brain volume is normal which means that the patient does not have the late stage MS related brain atrophy or black holes.  This MRI did not show major changes in comparison to a study from January 2018.  She had been treated with Tecfidera but recently attended a lecture on Tysabri and would be interested to see if this medication could help her further.  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  A cervical spine MRI had also been obtained and showed only some H related bony changes but no lesions within the spinal cord.  1 of Summer Holloway main concern is that she feels very fatigued there is a lack of energy, she would like to sleep all day and she also eats more.  I also wonder if Tysabri may give her that additional energy boost that many patients have experienced that is not part of the Tecfidera effect.  I will order today the necessary blood tests including a JC virus titer, I make sure that she does have antibodies for varicella-zoster virus, she completed the 2 stage latest varicella virus vaccination in 2018. Hepatitis panel will be ordered , too.   She reviewed the MRIs  with me today, and I pointed out where her white matter lesions are located.   MM 09/22/16, Summer Holloway is a 68 year old female with a history of multiple sclerosis. She returns today for follow-up. She is currently on Medicare and tolerating it well. She reports that she started this March. She reports that she did notice after starting medication she developed numbness in the feet bilaterally. She states that she has ongoing generalized weakness but denies any new weakness. She reports that she is having incontinence of bowels for approximately 2 months. She states this does not occur with every bowel movement but does happen frequently. She states that she has noticed blurry vision and diplopia that started approximately 3  months ago. Reports that her mood is stable. Reports that she sleeps okay. Reports that her balance has been off. She had a fall approximately 3 weeks ago and suffered a laceration to the left arm. She did not start any new medication. She returns today for an evaluation.  HISTORY 05/27/16 Copied from Dr. Holland Commons notes: [name] is seen here today in the presence of her husband and was referred her primary care physician Dr. Kevan Ny for evaluation of disequilibrium, unsteady gait, possible ataxia. She carries a diagnosis of weight gain, hypertension, she also has been followed by Dr. Kevan Ny for fibromyalgia, insomnia and has been treated for depression. She has been taking Lyrica and amitriptyline. The chronic disequilibrium begun according to Dr. Kevan Ny notes about 5 years ago " unsteady " while the patient walks or is in motion. Sometimes she has have  to hold onto her husband's arm to steady her gait. She feels as it is being pulled into a different direction, cannot walk a straight line. She jokingly reports having no chance to pass a drunk driving test. She is only experiencing this pulling when in motion and not at rest. She does not describe vertigo. When anxious she is much more off balance!  She is pleasant and conversant, reports that she is afraid of falling. She has not been evaluated by ENT. She does have hearing loss and a chronic tinnitus ( a constant high pitches sound ) for 35 years.  She had no MRI of the brain and has no family history of schwannoma.Maternal aunt clarabelle had ataxia, vertigo and auditory hallucinations. Mother had anxiety. Social history: Married, non smoker, non ETOH, use, caffeine , none. Retired, Comptrollerlibrarian.   Mrs. Laural BenesJohnson is seen here today in the presence of her husband and was referred her primary care physician Dr. Kevan NyGates for evaluation of disequilibrium, unsteady gait, possible ataxia. She carries a diagnosis of weight gain, hypertension, she also has been  followed by Dr. Kevan NyGates for fibromyalgia, insomnia and has been treated for depression. She has been taking Lyrica and amitriptyline.  The chronic disequilibrium begun according to Dr. Kevan NyGates notes about  5 years ago " unsteady  " while  the patient walks or is in motion. Sometimes she has have to hold onto her husband's arm to steady her gait. She feels as it is being pulled into a different direction, cannot walk a straight line. She jokingly reports having no chance to pass a drunk driving test. She is only experiencing this pulling when in motion and not at rest. She does not describe vertigo. When anxious she is much more off balance!  She is pleasant and conversant, reports that she is afraid of falling. She has not been evaluated by ENT. She does have hearing loss and a chronic tinnitus  ( a constant high pitches sound ) for 35 years.  She had no MRI of the brain and has no family history of schwannoma.Maternal aunt Clarabelle had ataxia, vertigo and auditory hallucinations. Mother had anxiety.  Social history:  Married, non smoker, non ETOH, use, caffeine , none. Retired, Comptrollerlibrarian.   Interval history from 03/05/2016, I have pleasure of seeing Mr. and Mrs. Sylvester today to meet them and discuss the results of her recent MRI which was abnormal and her spinal tap results, cerebral spinal fluid have been sent for oligoclonal bands, cells and differential, glucose and protein. Patient seen and a routine revisit today is 05/27/2016. Mrs. Yetta BarreJones has tolerated Tecfidera very well - improved tremor and less balance problems subjectively. Today CBC and CMET , next visit with NP after another 6 -8 weeks.     REVIEW OF SYSTEMS: Out of a complete 14 system review of symptoms, the patient complains only of the following symptoms, weakness of right leg, memory loss, chronic pain, and all other reviewed systems are negative.  ALLERGIES: No Known Allergies  HOME MEDICATIONS: Outpatient Medications Prior to  Visit  Medication Sig Dispense Refill  . ALPRAZolam (XANAX) 0.25 MG tablet TAKE 1 TABLET BY MOUTH EVERY DAY AS NEEDED FOR ANXIETY 30 tablet 5  . amitriptyline (ELAVIL) 50 MG tablet Take 25 mg by mouth at bedtime.    Marland Kitchen. amLODipine (NORVASC) 5 MG tablet Take 5 mg by mouth daily.    Marland Kitchen. amphetamine-dextroamphetamine (ADDERALL) 10 MG tablet Take 5 mg by mouth daily as needed (enegry).    .Marland Kitchen  aspirin EC 81 MG tablet Take 1 tablet (81 mg total) by mouth daily.    Marland Kitchen. buPROPion (WELLBUTRIN XL) 150 MG 24 hr tablet Take 150 mg by mouth daily.    . DULoxetine (CYMBALTA) 60 MG capsule Take 60 mg by mouth daily.    Marland Kitchen. levothyroxine (SYNTHROID, LEVOTHROID) 50 MCG tablet Take 50 mcg by mouth daily before breakfast.    . ocrelizumab (OCREVUS) 300 MG/10ML injection Inject 600 mg into the vein every 6 (six) months.    . pantoprazole (PROTONIX) 40 MG tablet Take 40 mg by mouth daily.    . pregabalin (LYRICA) 100 MG capsule Take 100 mg by mouth 2 (two) times daily.     . propranolol (INDERAL) 10 MG tablet Take 1 tablet by mouth twice daily. 60 tablet 11  . rosuvastatin (CRESTOR) 5 MG tablet Take 5 mg by mouth daily.    . traMADol (ULTRAM) 50 MG tablet Take 1 tablet (50 mg total) by mouth every 12 (twelve) hours as needed. 24 tablet 1   No facility-administered medications prior to visit.     PAST MEDICAL HISTORY: Past Medical History:  Diagnosis Date  . Anxiety   . Bursitis of left hip   . Depression   . Esophageal reflux   . Fibromyalgia   . Hearing loss   . Hyperlipemia   . Hyperlipidemia   . Hypertension   . Multiple sclerosis ([facility])   . Osteoarthritis   . PVC (premature ventricular contraction)   . Sciatica   . Tinnitus   . Uterine prolapse     PAST SURGICAL HISTORY: Past Surgical History:  Procedure Laterality Date  . BREAST BIOPSY  05/24/2002  . BREAST BIOPSY Left 05/30/2016   negative  . CATARACT EXTRACTION, BILATERAL    . PARTIAL HYSTERECTOMY    . surgical mesh     of uterine     FAMILY HISTORY: Family History  Problem Relation Age of Onset  . Fibromyalgia Mother   . Anxiety disorder Mother   . Heart attack Father [id number]  . CAD Father   . Heart attack Cousin     SOCIAL HISTORY: Social History   Socioeconomic History  . Marital status: Married    Spouse name: Deniece PortelaWayne  . Number of children: Not on file  . Years of education: Not on file  . Highest education level: Not on file  Occupational History  . Not on file  Social Needs  . Financial resource strain: Not on file  . Food insecurity    Worry: Not on file    Inability: Not on file  . Transportation needs    Medical: Not on file    Non-medical: Not on file  Tobacco Use  . Smoking status: Never Smoker  . Smokeless tobacco: Never Used  Substance and Sexual Activity  . Alcohol use: No  . Drug use: Not Currently  . Sexual activity: Not on file  Lifestyle  . Physical activity    Days per week: Not on file    Minutes per session: Not on file  . Stress: Not on file  Relationships  . Social Musicianconnections    Talks on phone: Not on file    Gets together: Not on file    Attends religious service: Not on file    Active member of club or organization: Not on file    Attends meetings of clubs or organizations: Not on file    Relationship status: Not on file  . Intimate partner violence  Fear of current or ex partner: Not on file    Emotionally abused: Not on file    Physically abused: Not on file    Forced sexual activity: Not on file  Other Topics Concern  . Not on file  Social History Narrative   Lives with husband      PHYSICAL EXAM  Vitals:   10/25/18 0913  BP: 112/90  Pulse: 87  Temp: 98 F (36.7 C)  Weight: 185 lb 6.4 oz (84.1 kg)  Height: 5\' 7"  (1.702 m)   Body mass index is 29.04 kg/m.  Generalized: Well developed, in no acute distress  Cardiology: normal rate and rhythm, no murmur noted Neurological examination  Mentation: Alert oriented to time, place, history taking.  Follows all commands speech and language fluent Cranial nerve II-XII: Pupils were equal round reactive to light. Extraocular movements were full, visual field were full on confrontational test. Facial sensation and strength were normal. Uvula tongue midline. Head turning and shoulder shrug  were normal and symmetric. Motor: The motor testing reveals 4 over 5 strength of all 4 extremities. Right lower extremity slightly weaker than left lower extremity. Good symmetric motor tone is noted throughout. Pain with palpation of right trochanter region and mid buttock, pain reported of left mid scapula at T9-10.  Sensory: Sensory testing is intact to soft touch on all 4 extremities. Pinprick testing decreased of right lateral thigh. No evidence of extinction is noted.  Coordination: Cerebellar testing reveals good finger-nose-finger and heel-to-shin bilaterally.  Gait and station: Gait is ataxic. Tandem gait is unsteady. Romberg is negative. No drift is seen.  Reflexes: Deep tendon reflexes are symmetric and normal bilaterally.   DIAGNOSTIC DATA (LABS, IMAGING, TESTING) - I reviewed patient records, labs, notes, testing and imaging myself where available.  No flowsheet data found.   Lab Results  Component Value Date   WBC 7.2 03/24/2017   HGB 14.6 03/24/2017   HCT 41.4 03/24/2017   MCV 93 03/24/2017   PLT 368 03/24/2017      Component Value Date/Time   NA 142 03/24/2017 1047   K 4.7 03/24/2017 1047   CL 98 03/24/2017 1047   CO2 26 03/24/2017 1047   GLUCOSE 93 03/24/2017 1047   BUN 15 03/24/2017 1047   CREATININE 0.88 03/24/2017 1047   CALCIUM 9.3 03/24/2017 1047   PROT 7.0 03/24/2017 1047   ALBUMIN 4.3 03/24/2017 1047   AST 17 03/24/2017 1047   ALT 10 03/24/2017 1047   ALKPHOS 122 (H) 03/24/2017 1047   BILITOT 0.2 03/24/2017 1047   GFRNONAA 68 03/24/2017 1047   GFRAA 79 03/24/2017 1047   No results found for: CHOL, HDL, LDLCALC, LDLDIRECT, TRIG, CHOLHDL No results found for:  HGBA1C No results found for: VITAMINB12 No results found for: TSH   ASSESSMENT AND PLAN 68 y.o. year old female  has a past medical history of Anxiety, Bursitis of left hip, Depression, Esophageal reflux, Fibromyalgia, Hearing loss, Hyperlipemia, Hyperlipidemia, Hypertension, Multiple sclerosis ([place]), Osteoarthritis, PVC (premature ventricular contraction), Sciatica, Tinnitus, and Uterine prolapse. here with     ICD-10-CM   1. MS (multiple sclerosis) ([place])  G35 CBC with Differential/Platelets    CMP    Vitamin B12    MR BRAIN W WO CONTRAST    MR CERVICAL SPINE W WO CONTRAST  2. DDD (degenerative disc disease), cervical  M50.30 MR CERVICAL SPINE W WO CONTRAST    MR THORACIC SPINE W WO CONTRAST  3. Spinal stenosis of thoracic region  M48.04 MR  THORACIC SPINE W WO CONTRAST  4. Spinal stenosis of lumbosacral region  M48.07 MR Lumbar Spine W Wo Contrast  5. Lumbar radiculopathy  M54.16 MR Lumbar Spine W Wo Contrast  6. Chronic scapular pain  M89.8X1 MR THORACIC SPINE W WO CONTRAST   G89.29   7. Memory loss  R41.3     Eunice Blase continues to have concerns of multiple chronic pain complaints as well as right lower extremity weakness and memory loss.  I do not feel that history or exam are consistent with MS exacerbation, however, last MRI in 2018.  We will update MRI of brain and cervical spine to assess for any new MS lesions.  I have placed an order for an MRI of the thoracic and lumbar spine due to worsening pain concerns, right lower extremity weakness, decreased sensation of the right lateral thigh and multiple falls.  We have discussed need to consider referral to physical therapy, however, Debbie wishes to wait to review results of MRI.  I have advised that she use an assistive device when walking.  This will help with stabilization.  Fall precautions discussed.  She will continue Ocrevus infusions as planned.  We will update lab work today.  I will try a steroid taper pack for multiple pain  concerns.  She was advised on appropriate administration and possible side effects.  She will follow-up closely with primary care and Dr. Ethelene Hal as well.  Patient is completely oriented in the office today.  No overt signs of dementia noted.  We will plan to assess memory in more detail at next visit.  She verbalizes understanding.  She has an established appointment with Dr. Vickey Huger scheduled for December 7.  She will keep that as planned.  She will call sooner for any new or worsening symptoms.  She verbalizes understanding and agreement with this plan.   Orders Placed This Encounter  Procedures  . MR BRAIN W WO CONTRAST    Standing Status:   Future    Standing Expiration Date:   12/25/2019    Order Specific Question:   If indicated for the ordered procedure, I authorize the administration of contrast media per Radiology protocol    Answer:   Yes    Order Specific Question:   What is the patient's sedation requirement?    Answer:   No Sedation    Order Specific Question:   Does the patient have a pacemaker or implanted devices?    Answer:   No    Order Specific Question:   Radiology Contrast Protocol - do NOT remove file path    Answer:   \\[email or web address]    Order Specific Question:   Preferred imaging location?    Answer:   Internal  . MR CERVICAL SPINE W WO CONTRAST    Standing Status:   Future    Standing Expiration Date:   12/25/2019    Order Specific Question:   If indicated for the ordered procedure, I authorize the administration of contrast media per Radiology protocol    Answer:   Yes    Order Specific Question:   What is the patient's sedation requirement?    Answer:   No Sedation    Order Specific Question:   Does the patient have a pacemaker or implanted devices?    Answer:   No    Order Specific Question:   Radiology Contrast Protocol - do NOT remove file path    Answer:   \\[email or web address]    Order Specific Question:  Preferred imaging location?    Answer:   Internal  . MR THORACIC SPINE W WO CONTRAST    Standing Status:   Future    Standing Expiration Date:   12/25/2019    Order Specific Question:   GRA to provide read?    Answer:   Yes    Order Specific Question:   If indicated for the ordered procedure, I authorize the administration of contrast media per Radiology protocol    Answer:   Yes    Order Specific Question:   What is the patient's sedation requirement?    Answer:   No Sedation    Order Specific Question:   Does the patient have a pacemaker or implanted devices?    Answer:   No    Order Specific Question:   Preferred imaging location?    Answer:   Internal    Order Specific Question:   Radiology Contrast Protocol - do NOT remove file path    Answer:   \\[email or web address]  . MR Lumbar Spine W Wo Contrast    Standing Status:   Future    Standing Expiration Date:   12/25/2019    Order Specific Question:   If indicated for the ordered procedure, I authorize the administration of contrast media per Radiology protocol    Answer:   Yes    Order Specific Question:   What is the patient's sedation requirement?    Answer:   No Sedation    Order Specific Question:   Does the patient have a pacemaker or implanted devices?    Answer:   No    Order Specific Question:   Radiology Contrast Protocol - do NOT remove file path    Answer:   \\[email or web address]    Order Specific Question:   Preferred imaging location?    Answer:   Internal  . CBC with Differential/Platelets  . CMP  . Vitamin B12     Meds ordered this encounter  Medications  . predniSONE (STERAPRED UNI-PAK 21 TAB) 10 MG (21) TBPK tablet    Sig: Prednisone taper pack as directed    Dispense:  21 tablet    Refill:  0    Order Specific Question:   Supervising Provider    Answer:   Anson Fret [[id number]]      I spent 45 minutes with the patient. 50% of this time was spent  counseling and educating patient on plan of care and medications.    Shawnie Dapper, FNP-C 10/25/2018, 11:35 AM [facility] [address] [place], [place] [address] [phone]

## 2018-10-25 NOTE — Patient Instructions (Signed)
Continue antibiotic for UTI  Steroid taper for concerns of back pain, buttock pain, shoulder burning  We will update labs and MRI today  Follow up with Dr Vickey Hugerohmeier as already scheduled on 12/7.   Multiple Sclerosis Multiple sclerosis (MS) is a disease of the brain, spinal cord, and optic nerves (central nervous system). It causes the body's disease-fighting (immune) system to destroy the protective covering (myelin sheath) around nerves in the brain. When this happens, signals (nerve impulses) going to and from the brain and spinal cord do not get sent properly or may not get sent at all. There are several types of MS:  Relapsing-remitting MS. This is the most common type. This causes sudden attacks of symptoms. After an attack, you may recover completely until the next attack, or some symptoms may remain permanently.  Secondary progressive MS. This usually develops after the onset of relapsing-remitting MS. Similar to relapsing-remitting MS, this type also causes sudden attacks of symptoms. Attacks may be less frequent, but symptoms slowly get worse (progress) over time.  Primary progressive MS. This causes symptoms that steadily progress over time. This type of MS does not cause sudden attacks of symptoms. The age of onset of MS varies, but it often develops between 3820-68 years of age. MS is a lifelong (chronic) condition. There is no cure, but treatment can help slow down the progression of the disease. What are the causes? The cause of this condition is not known. What increases the risk? You are more likely to develop this condition if:  You are a woman.  You have a relative with MS. However, the condition is not passed from parent to child (inherited).  You have a lack (deficiency) of vitamin D.  You smoke. MS is more common in the [place] than in the [place]. What are the signs or symptoms? Relapsing-remitting and secondary progressive MS cause  symptoms to occur in episodes or attacks that may last weeks to months. There may be long periods between attacks in which there are almost no symptoms. Primary progressive MS causes symptoms to steadily progress after they develop. Symptoms of MS vary because of the many different ways it affects the central nervous system. The main symptoms include:  Vision problems and eye pain.  Numbness.  Weakness.  Inability to move your arms, hands, feet, or legs (paralysis).  Balance problems.  Shaking that you cannot control (tremors).  Muscle spasms.  Problems with thinking (cognitive changes). MS can also cause symptoms that are associated with the disease, but are not always the direct result of an MS attack. They may include:  Inability to control urination or bowel movements (incontinence).  Headaches.  Fatigue.  Inability to tolerate heat.  Emotional changes.  Depression.  Pain. How is this diagnosed? This condition is diagnosed based on:  Your symptoms.  A neurological exam. This involves checking central nervous system function, such as nerve function, reflexes, and coordination.  MRIs of the brain and spinal cord.  Lab tests, including a lumbar puncture that tests the fluid that surrounds the brain and spinal cord (cerebrospinal fluid).  Tests to measure the electrical activity of the brain in response to stimulation (evoked potentials). How is this treated? There is no cure for MS, but medicines can help decrease the number and frequency of attacks and help relieve nuisance symptoms. Treatment options may include:  Medicines that reduce the frequency of attacks. These medicines may be given by injection, by mouth (orally), or through an  IV.  Medicines that reduce inflammation (steroids). These may provide short-term relief of symptoms.  Medicines to help control pain, depression, fatigue, or incontinence.  Vitamin D, if you have a deficiency.  Using devices  to help you move around (assistive devices), such as braces, a cane, or a walker.  Physical therapy to strengthen and stretch your muscles.  Occupational therapy to help you with everyday tasks.  Alternative or complementary treatments such as exercise, massage, or acupuncture. Follow these instructions at home:  Take over-the-counter and prescription medicines only as told by your health care provider.  Do not drive or use heavy machinery while taking prescription pain medicine.  Use assistive devices as recommended by your physical therapist or your health care provider.  Exercise as directed by your health care provider.  Return to your normal activities as told by your health care provider. Ask your health care provider what activities are safe for you.  Reach out for support. Share your feelings with friends, family, or a support group.  Keep all follow-up visits as told by your health care provider and therapists. This is important. Where to find more information  National Multiple Sclerosis Society: https://www.nationalmssociety.org Contact a health care provider if:  You feel depressed.  You develop new pain or numbness.  You have tremors.  You have problems with sexual function. Get help right away if:  You develop paralysis.  You develop numbness.  You have problems with your bladder or bowel function.  You develop double vision.  You lose vision in one or both eyes.  You develop suicidal thoughts.  You develop severe confusion. If you ever feel like you may hurt yourself or others, or have thoughts about taking your own life, get help right away. You can go to your nearest emergency department or call:  Your local emergency services (911 in the [place]  A suicide crisis helpline, such as the National Suicide Prevention Lifeline at 630-178-5995. This is open 24 hours a day. Summary  Multiple sclerosis (MS) is a disease of the central nervous system  that causes the body's immune system to destroy the protective covering (myelin sheath) around nerves in the brain.  There are 3 types of MS: relapsing-remitting, secondary progressive, and primary progressive. Relapsing-remitting and secondary progressive MS cause symptoms to occur in episodes or attacks that may last weeks to months. Primary progressive MS causes symptoms to steadily progress after they develop.  There is no cure for MS, but medicines can help decrease the number and frequency of attacks and help relieve nuisance symptoms. Treatment may also include physical or occupational therapy.  If you develop numbness, paralysis, vision problems, or other neurological symptoms, get help right away. This information is not intended to replace advice given to you by your health care provider. Make sure you discuss any questions you have with your health care provider. Document Released: 12/28/1999 Document Revised: 12/12/2016 Document Reviewed: 03/10/2016 Elsevier Patient Education  2020 [employer]   Radicular Pain Radicular pain is a type of pain that spreads from your back or neck along a spinal nerve. Spinal nerves are nerves that leave the spinal cord and go to the muscles. Radicular pain is sometimes called radiculopathy, radiculitis, or a pinched nerve. When you have this type of pain, you may also have weakness, numbness, or tingling in the area of your body that is supplied by the nerve. The pain may feel sharp and burning. Depending on which spinal nerve is affected, the pain may occur  in the:  Neck area (cervical radicular pain). You may also feel pain, numbness, weakness, or tingling in the arms.  Mid-spine area (thoracic radicular pain). You would feel this pain in the back and chest. This type is rare.  Lower back area (lumbar radicular pain). You would feel this pain as low back pain. You may feel pain, numbness, weakness, or tingling in the buttocks or legs. Sciatica is a  type of lumbar radicular pain that shoots down the back of the leg. Radicular pain occurs when one of the spinal nerves becomes irritated or squeezed (compressed). It is often caused by something pushing on a spinal nerve, such as one of the bones of the spine (vertebrae) or one of the round cushions between vertebrae (intervertebral disks). This can result from:  An injury.  Wear and tear or aging of a disk.  The growth of a bone spur that pushes on the nerve. Radicular pain often goes away when you follow instructions from your health care provider for relieving pain at home. Follow these instructions at home: Managing pain      If directed, put ice on the affected area: ? Put ice in a plastic bag. ? Place a towel between your skin and the bag. ? Leave the ice on for 20 minutes, 2-3 times a day.  If directed, apply heat to the affected area as often as told by your health care provider. Use the heat source that your health care provider recommends, such as a moist heat pack or a heating pad. ? Place a towel between your skin and the heat source. ? Leave the heat on for 20-30 minutes. ? Remove the heat if your skin turns bright red. This is especially important if you are unable to feel pain, heat, or cold. You may have a greater risk of getting burned. Activity   Do not sit or rest in bed for long periods of time.  Try to stay as active as possible. Ask your health care provider what type of exercise or activity is best for you.  Avoid activities that make your pain worse, such as bending and lifting.  Do not lift anything that is heavier than 10 lb (4.5 kg), or the limit that you are told, until your health care provider says that it is safe.  Practice using proper technique when lifting items. Proper lifting technique involves bending your knees and rising up.  Do strength and range-of-motion exercises only as told by your health care provider or physical therapist. General  instructions  Take over-the-counter and prescription medicines only as told by your health care provider.  Pay attention to any changes in your symptoms.  Keep all follow-up visits as told by your health care provider. This is important. ? Your health care provider may send you to a physical therapist to help with this pain. Contact a health care provider if:  Your pain and other symptoms get worse.  Your pain medicine is not helping.  Your pain has not improved after a few weeks of home care.  You have a fever. Get help right away if:  You have severe pain, weakness, or numbness.  You have difficulty with bladder or bowel control. Summary  Radicular pain is a type of pain that spreads from your back or neck along a spinal nerve.  When you have radicular pain, you may also have weakness, numbness, or tingling in the area of your body that is supplied by the nerve.  The  pain may feel sharp or burning.  Radicular pain may be treated with ice, heat, medicines, or physical therapy. This information is not intended to replace advice given to you by your health care provider. Make sure you discuss any questions you have with your health care provider. Document Released: 02/07/2004 Document Revised: 07/14/2017 Document Reviewed: 07/14/2017 Elsevier Patient Education  [employer].   Chronic Back Pain When back pain lasts longer than 3 months, it is called chronic back pain. Pain may get worse at certain times (flare-ups). There are things you can do at home to manage your pain. Follow these instructions at home: Activity      Avoid bending and other activities that make pain worse.  When standing: ? Keep your upper back and neck straight. ? Keep your shoulders pulled back. ? Avoid slouching.  When sitting: ? Keep your back straight. ? Relax your shoulders. Do not round your shoulders or pull them backward.  Do not sit or stand in one place for long periods of  time.  Take short rest breaks during the day. Lying down or standing is usually better than sitting. Resting can help relieve pain.  When sitting or lying down for a long time, do some mild activity or stretching. This will help to prevent stiffness and pain.  Get regular exercise. Ask your doctor what activities are safe for you.  Do not lift anything that is heavier than 10 lb (4.5 kg). To prevent injury when you lift things: ? Bend your knees. ? Keep the weight close to your body. ? Avoid twisting. Managing pain  If told, put ice on the painful area. Your doctor may tell you to use ice for 24-48 hours after a flare-up starts. ? Put ice in a plastic bag. ? Place a towel between your skin and the bag. ? Leave the ice on for 20 minutes, 2-3 times a day.  If told, put heat on the painful area as often as told by your doctor. Use the heat source that your doctor recommends, such as a moist heat pack or a heating pad. ? Place a towel between your skin and the heat source. ? Leave the heat on for 20-30 minutes. ? Remove the heat if your skin turns bright red. This is especially important if you are unable to feel pain, heat, or cold. You may have a greater risk of getting burned.  Soak in a warm bath. This can help relieve pain.  Take over-the-counter and prescription medicines only as told by your doctor. General instructions  Sleep on a firm mattress. Try lying on your side with your knees slightly bent. If you lie on your back, put a pillow under your knees.  Keep all follow-up visits as told by your doctor. This is important. Contact a doctor if:  You have pain that does not get better with rest or medicine. Get help right away if:  One or both of your arms or legs feel weak.  One or both of your arms or legs lose feeling (numbness).  You have trouble controlling when you poop (bowel movement) or pee (urinate).  You feel sick to your stomach (nauseous).  You throw up  (vomit).  You have belly (abdominal) pain.  You have shortness of breath.  You pass out (faint). Summary  When back pain lasts longer than 3 months, it is called chronic back pain.  Pain may get worse at certain times (flare-ups).  Use ice and heat as  told by your doctor. Your doctor may tell you to use ice after flare-ups. This information is not intended to replace advice given to you by your health care provider. Make sure you discuss any questions you have with your health care provider. Document Released: 06/18/2007 Document Revised: 04/22/2018 Document Reviewed: 08/14/2016 Elsevier Patient Education  2020 [employer].

## 2018-10-26 ENCOUNTER — Telehealth: Payer: Self-pay | Admitting: *Deleted

## 2018-10-26 LAB — COMPREHENSIVE METABOLIC PANEL
ALT: 6 IU/L (ref 0–32)
AST: 17 IU/L (ref 0–40)
Albumin/Globulin Ratio: 1.8 (ref 1.2–2.2)
Albumin: 4.2 g/dL (ref 3.8–4.8)
Alkaline Phosphatase: 150 IU/L — ABNORMAL HIGH (ref 39–117)
BUN/Creatinine Ratio: 15 (ref 12–28)
BUN: 14 mg/dL (ref 8–27)
Bilirubin Total: <0.2 mg/dL (ref 0.0–1.2)
CO2: 25 mmol/L (ref 20–29)
Calcium: 9.3 mg/dL (ref 8.7–10.3)
Chloride: 103 mmol/L (ref 96–106)
Creatinine, Ser: 0.94 mg/dL (ref 0.57–1.00)
GFR calc Af Amer: 72 mL/min/{1.73_m2} (ref >59)
GFR calc non Af Amer: 63 mL/min/{1.73_m2} (ref >59)
Globulin, Total: 2.4 g/dL (ref 1.5–4.5)
Glucose: 85 mg/dL (ref 65–99)
Potassium: 4.8 mmol/L (ref 3.5–5.2)
Sodium: 142 mmol/L (ref 134–144)
Total Protein: 6.6 g/dL (ref 6.0–8.5)

## 2018-10-26 LAB — CBC WITH DIFFERENTIAL/PLATELET
Basophils Absolute: 0.1 10*3/uL (ref 0.0–0.2)
Basos: 1 %
EOS (ABSOLUTE): 0.1 10*3/uL (ref 0.0–0.4)
Eos: 2 %
Hematocrit: 35.5 % (ref 34.0–46.6)
Hemoglobin: 12.6 g/dL (ref 11.1–15.9)
Immature Grans (Abs): 0 10*3/uL (ref 0.0–0.1)
Immature Granulocytes: 0 %
Lymphocytes Absolute: 1 10*3/uL (ref 0.7–3.1)
Lymphs: 15 %
MCH: 31.4 pg (ref 26.6–33.0)
MCHC: 35.5 g/dL (ref 31.5–35.7)
MCV: 89 fL (ref 79–97)
Monocytes Absolute: 0.7 10*3/uL (ref 0.1–0.9)
Monocytes: 11 %
Neutrophils Absolute: 4.8 10*3/uL (ref 1.4–7.0)
Neutrophils: 71 %
Platelets: 421 10*3/uL (ref 150–450)
RBC: 4.01 x10E6/uL (ref 3.77–5.28)
RDW: 14.6 % (ref 11.7–15.4)
WBC: 6.7 10*3/uL (ref 3.4–10.8)

## 2018-10-26 LAB — VITAMIN B12: Vitamin B-12: 440 pg/mL (ref 232–1245)

## 2018-10-26 NOTE — Telephone Encounter (Signed)
Spoke with patient and informed her labs look stable. Her  Alk phos is elevated but similar to past. She can follow up with PCP regarding this. She verbalized understanding, appreciation.

## 2018-11-11 ENCOUNTER — Other Ambulatory Visit: Payer: Medicare Other

## 2018-11-15 ENCOUNTER — Ambulatory Visit
Admission: RE | Admit: 2018-11-15 | Discharge: 2018-11-15 | Disposition: A | Discharge status: Home / Self Care | Payer: Medicare Other | Source: Ambulatory Visit | Attending: Family Medicine | Admitting: Family Medicine

## 2018-11-15 ENCOUNTER — Other Ambulatory Visit: Payer: Self-pay

## 2018-11-15 DIAGNOSIS — M5030 Other cervical disc degeneration, unspecified cervical region: Secondary | ICD-10-CM

## 2018-11-15 DIAGNOSIS — M4807 Spinal stenosis, lumbosacral region: Secondary | ICD-10-CM

## 2018-11-15 DIAGNOSIS — G8929 Other chronic pain: Secondary | ICD-10-CM

## 2018-11-15 DIAGNOSIS — M4804 Spinal stenosis, thoracic region: Secondary | ICD-10-CM

## 2018-11-15 DIAGNOSIS — M5416 Radiculopathy, lumbar region: Secondary | ICD-10-CM

## 2018-11-18 ENCOUNTER — Telehealth: Payer: Self-pay | Admitting: Family Medicine

## 2018-11-18 ENCOUNTER — Other Ambulatory Visit: Payer: Self-pay | Admitting: Family Medicine

## 2018-11-18 MED ORDER — ALPRAZOLAM 0.5 MG PO TABS: 0.5000 mg | ORAL_TABLET | Freq: Every evening | ORAL | 0 refills | Status: DC | PRN

## 2018-11-18 NOTE — Telephone Encounter (Signed)
Yes. That is ok. Do we need to call and cancel first order and replace or I could do another order after her first MRI to make sure it works.

## 2018-11-18 NOTE — Telephone Encounter (Signed)
Pt states she was scheduled for 2 of her 4 MRI's but was unable to.  Pt is asking if something like valium can be called in for her to have her 4 MRI's please call

## 2018-11-18 NOTE — Telephone Encounter (Signed)
I have called in alprazolam 0.5mg . She can take one tablet 30 minutes prior and another between imaging if needed. She must have a driver.

## 2018-11-18 NOTE — Telephone Encounter (Signed)
I called pt and she states that she has 4 MRI to have done (2 at a time).  She needs something to get thru this.  I looked on Drug registry and she has not been using xanax that is listed on her med list (last fill 2018), she stated only when anxious.  Last fill showed 11-17-18 by Dr. Inda Merlin valium 5mg  tabs #10.  Pt stated this for anxiety prn.  I relayed we normally use xanax 0.5mg  take one 1 hour prior to mri and then one when she arrives at facility if needed.  Instructed not to take both valium and xanax, needs driver.  (if valium works better can she use this? Now much?)  Please advise. [facility].

## 2018-11-22 NOTE — Telephone Encounter (Signed)
No answer this am.  

## 2018-12-04 ENCOUNTER — Other Ambulatory Visit: Payer: Self-pay

## 2018-12-04 ENCOUNTER — Ambulatory Visit
Admission: RE | Admit: 2018-12-04 | Discharge: 2018-12-04 | Disposition: A | Discharge status: Home / Self Care | Payer: Medicare Other | Source: Ambulatory Visit | Attending: Family Medicine | Admitting: Family Medicine

## 2018-12-04 DIAGNOSIS — G35 Multiple sclerosis: Secondary | ICD-10-CM

## 2018-12-04 DIAGNOSIS — M5030 Other cervical disc degeneration, unspecified cervical region: Secondary | ICD-10-CM

## 2018-12-04 MED ORDER — GADOBENATE DIMEGLUMINE 529 MG/ML IV SOLN
17.0000 mL | Freq: Once | INTRAVENOUS | Status: AC | PRN
  Administered 2018-12-04: 17 mL via INTRAVENOUS

## 2018-12-07 ENCOUNTER — Telehealth: Payer: Self-pay | Admitting: Neurology

## 2018-12-07 ENCOUNTER — Encounter: Payer: Self-pay | Admitting: Neurology

## 2018-12-07 NOTE — Telephone Encounter (Signed)
-----   Message from Darleen Crocker, RN sent at 12/07/2018  8:40 AM EST -----  ----- Message ----- From: Debbora Presto, NP Sent: 12/07/2018   7:55 AM EST To: Brandon Melnick, RN  Please let her know that her MR Brain and cervical spine are stable from an MS perspective. No new lesions noted. There was mention of possible sinus inflammation and mild cervical stenosis. She can follow up with PCP as needed for any concerns related to these findings (sinus symptoms, neck pain..etc.). If she is feeling well, no urgent concerns.

## 2018-12-07 NOTE — Telephone Encounter (Signed)
Attempted to call the patient on her home phone and there was no answer or VM set up. I called the cell number as well. Also no answer and VM was full.  I will send mychart message to the patient.

## 2018-12-12 ENCOUNTER — Other Ambulatory Visit: Payer: Self-pay

## 2018-12-12 ENCOUNTER — Ambulatory Visit: Admission: RE | Admit: 2018-12-12 | Payer: Medicare Other | Source: Ambulatory Visit

## 2018-12-12 ENCOUNTER — Ambulatory Visit
Admission: RE | Admit: 2018-12-12 | Discharge: 2018-12-12 | Disposition: A | Discharge status: Home / Self Care | Payer: Medicare Other | Source: Ambulatory Visit | Attending: Family Medicine | Admitting: Family Medicine

## 2018-12-12 DIAGNOSIS — M5416 Radiculopathy, lumbar region: Secondary | ICD-10-CM | POA: Diagnosis not present

## 2018-12-12 DIAGNOSIS — M4807 Spinal stenosis, lumbosacral region: Secondary | ICD-10-CM | POA: Diagnosis not present

## 2018-12-12 MED ORDER — GADOBENATE DIMEGLUMINE 529 MG/ML IV SOLN
16.0000 mL | Freq: Once | INTRAVENOUS | Status: AC | PRN
  Administered 2018-12-12: 16 mL via INTRAVENOUS

## 2018-12-14 ENCOUNTER — Telehealth: Payer: Self-pay | Admitting: *Deleted

## 2018-12-14 NOTE — Telephone Encounter (Signed)
I called and no answer. Will send mychart message.

## 2018-12-14 NOTE — Telephone Encounter (Signed)
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 There does not appear to be any specific nerve root involvement but the degeneration can be the cause of Summer Holloway pain. She should continue close follow up with Dr Nelva Bush for back and hip pain. If she has any new or worsening symptoms, have Summer Holloway let us know

## 2018-12-16 ENCOUNTER — Encounter: Payer: Self-pay | Admitting: Neurology

## 2018-12-16 NOTE — Telephone Encounter (Signed)
Mailed letter °

## 2018-12-20 ENCOUNTER — Ambulatory Visit: Payer: Self-pay | Admitting: Neurology

## 2018-12-20 ENCOUNTER — Encounter: Payer: Self-pay | Admitting: Neurology

## 2019-01-01 ENCOUNTER — Other Ambulatory Visit: Payer: Medicare Other

## 2019-01-01 ENCOUNTER — Inpatient Hospital Stay: Admission: RE | Admit: 2019-01-01 | Payer: Medicare Other | Source: Ambulatory Visit

## 2019-01-04 NOTE — Telephone Encounter (Signed)
Pt is in for infusion and wanted to get recent MRI results as well as schedule a follow up visit. I reviewed the results with her and then scheduled her for follow up in feb

## 2019-02-01 ENCOUNTER — Ambulatory Visit
Admission: RE | Admit: 2019-02-01 | Discharge: 2019-02-01 | Disposition: A | Discharge status: Home / Self Care | Payer: Medicare PPO | Source: Ambulatory Visit | Attending: Family Medicine | Admitting: Family Medicine

## 2019-02-01 ENCOUNTER — Other Ambulatory Visit: Payer: Self-pay

## 2019-02-01 DIAGNOSIS — M4804 Spinal stenosis, thoracic region: Secondary | ICD-10-CM

## 2019-02-01 DIAGNOSIS — M5030 Other cervical disc degeneration, unspecified cervical region: Secondary | ICD-10-CM

## 2019-02-01 DIAGNOSIS — G8929 Other chronic pain: Secondary | ICD-10-CM

## 2019-02-01 MED ORDER — GADOBENATE DIMEGLUMINE 529 MG/ML IV SOLN
17.0000 mL | Freq: Once | INTRAVENOUS | Status: AC | PRN
  Administered 2019-02-01: 17 mL via INTRAVENOUS

## 2019-02-15 ENCOUNTER — Ambulatory Visit: Payer: Self-pay | Admitting: Neurology

## 2019-02-16 ENCOUNTER — Ambulatory Visit: Payer: Medicare PPO | Admitting: Neurology

## 2019-02-16 ENCOUNTER — Encounter: Payer: Self-pay | Admitting: Neurology

## 2019-02-16 ENCOUNTER — Telehealth: Payer: Self-pay | Admitting: Neurology

## 2019-02-16 ENCOUNTER — Other Ambulatory Visit: Payer: Self-pay

## 2019-02-16 ENCOUNTER — Other Ambulatory Visit: Payer: Self-pay | Admitting: Neurology

## 2019-02-16 VITALS — BP 132/85 | HR 71 | Temp 97.2°F | Ht 68.0 in | Wt 184.0 lb

## 2019-02-16 DIAGNOSIS — Z79899 Other long term (current) drug therapy: Secondary | ICD-10-CM | POA: Diagnosis not present

## 2019-02-16 DIAGNOSIS — G3184 Mild cognitive impairment, so stated: Secondary | ICD-10-CM

## 2019-02-16 DIAGNOSIS — G35 Multiple sclerosis: Secondary | ICD-10-CM

## 2019-02-16 DIAGNOSIS — E782 Mixed hyperlipidemia: Secondary | ICD-10-CM | POA: Diagnosis not present

## 2019-02-16 DIAGNOSIS — R270 Ataxia, unspecified: Secondary | ICD-10-CM

## 2019-02-16 DIAGNOSIS — Z8669 Personal history of other diseases of the nervous system and sense organs: Secondary | ICD-10-CM

## 2019-02-16 DIAGNOSIS — I10 Essential (primary) hypertension: Secondary | ICD-10-CM | POA: Diagnosis not present

## 2019-02-16 NOTE — Progress Notes (Addendum)
Provider:  Melvyn Novas, M D  Referring Provider: Marden Noble, MD Primary Care Physician:  Marden Noble, MD  Chief Complaint  Patient presents with  . Follow-up    pt alone,rm 10. she feels that since starting the ocrevus she has had worsened. she describes memory difficulties that have became more noticable, ex. she forgot her apt yesterday.     02-16-2019, RV  Summer Holloway is a 69 y.o. female , seen here as a referral/ revisit  from Dr. Kevan Ny for evaluation of dysequilibrium.  I see her today on 16 February 2019 and the patient reports that her ataxia has progressed over the last 18 months, she has seen the nurse practitioners my last visit with her was in September 2018.  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  When the patient first was seen here she already reported that a ataxia-balance problem existed for over 5 years prior I would find now assume that her MS has been present for at least 10 years. After her last visit with nurse practitioner Amy Lomax nurse practitioner ordered an MRI of the cervical thoracic and lumbar spine.  We also performed a Montreal cognitive assessment today which she passed with flying colors a 28 out of 30 points. She did not make an attempt to solve the trail making test.  She has been on OCREVUS IV therapy.  Brain MRI Nov 2020 - no new lesions.   Cervical spine MRI with and without contrast was performed on December 04, 2018 also was by Amy Lomax spinal cord appears normal vertebral bodies are normally aligned some degenerative changes and disc height reduction but no enhancement after contrast which means that there is no involvement of the spinal cord.  The thoracic spine MRI cord was normal in size and appearance mild disc bulging as expected for age between T7 and 8 at T8 and 9.  No abnormal enhancement . The  MRI lumbar spine with and without contrast facet  hypertrophy right paramedian disc protrusion at T12 but actually seems smaller than the previous MRI there is mild to moderate spinal stenosis at L3-4 moderate spinal stenosis at L4-5 but no nerve root compression and again no normal abnormal enhancement pattern of the spinal cord.  No MS lesions were found in the spinal cord.  The patient was asked to close follow-up with Dr. Ethelene Hal for back and hip pain and to let us know if she has any new symptoms.  She continues to see Dr Ethelene Hal, and I am unsure if we actually treat MS given the non-progression of presumed demyelinating lesions. She just finished mammogram and dental appointments.   Montreal Cognitive Assessment Blind 02/16/2019  Attention: Read list of digits (0/2) 2  Attention: Read list of letters (0/1) 1  Attention: Serial 7 subtraction starting at 100 (0/3) 3  Language: Repeat phrase (0/2) 1  Language : Fluency (0/1) 1  Abstraction (0/2) 2  Delayed Recall (0/5) 5  Orientation (0/6) 6  Total 21     No flowsheet data found.     01-21-2016 [name] is seen here today in the presence of her husband and was referred her primary care physician Dr. Kevan Ny for evaluation of disequilibrium, unsteady gait, possible ataxia. She carries a diagnosis of weight gain, hypertension, she also has been followed by Dr. Kevan Ny for fibromyalgia, insomnia and has been treated for depression. She has been taking  Lyrica and amitriptyline. The chronic disequilibrium begun according to Dr. Kevan Ny notes about  5 years ago " unsteady  " while  the patient walks or is in motion. Sometimes she has have to hold onto her husband's arm to steady her gait. She feels as it is being pulled into a different direction, cannot walk a straight line. She jokingly reports having no chance to pass a drunk driving test. She is only experiencing this pulling when in motion and not at rest. She does not describe vertigo. When anxious she is much more off balance!  She is pleasant  and conversant, reports that she is afraid of falling. She has not been evaluated by ENT. She does have hearing loss and a chronic tinnitus  ( a constant high pitches sound ) for 35 years.  She had no MRI of the brain and has no family history of schwannoma.Maternal aunt Summer Holloway had ataxia, vertigo and auditory hallucinations. Mother had anxiety.  Social history:  Married, non smoker, non ETOH, use, caffeine , none. Retired, Comptroller.   Interval history from 03/05/2016, I have pleasure of seeing Mr. and Summer Holloway today to meet them and discuss the results of her recent MRI which was abnormal and her spinal tap results, cerebral spinal fluid have been sent for oligoclonal bands, cells and differential, glucose and protein.  Patient seen and a routine revisit today is 05/27/2016. Summer Holloway has tolerated Tecfidera very well - improved tremor and less balance problems subjectively. Today CBC and CMET , next visit with NP after another 6 -8 weeks.    Review of Systems: Out of a complete 14 system review, the patient complains of only the following symptoms, and all other reviewed systems are negative.  Weight gain, fatigue, palpitations, hearing loss, tinnitus, sometimes spinning sensation but not when not in motion. Blurred vision, snoring, urinary urgency, increased thirst, flushing, joint pain and aching muscles runny nose, depression anxiety, death interest in activities headaches confusion at times memory loss or amnesia, snoring. Hypertension high cholesterol depression anxiety and fibromyalgia ovarian cyst and hysterectomy.  Social History   Socioeconomic History  . Marital status: Married    Spouse name: Summer Holloway  . Number of children: Not on file  . Years of education: Not on file  . Highest education level: Not on file  Occupational History  . Not on file  Tobacco Use  . Smoking status: Never Smoker  . Smokeless tobacco: Never Used  Substance and Sexual Activity  . Alcohol use: No   . Drug use: Not Currently  . Sexual activity: Not on file  Other Topics Concern  . Not on file  Social History Narrative   Lives with husband   Social Determinants of Health   Financial Resource Strain:   . Difficulty of Paying Living Expenses: Not on file  Food Insecurity:   . Worried About Programme researcher, broadcasting/film/video in the Last Year: Not on file  . Ran Out of Food in the Last Year: Not on file  Transportation Needs:   . Lack of Transportation (Medical): Not on file  . Lack of Transportation (Non-Medical): Not on file  Physical Activity:   . Days of Exercise per Week: Not on file  . Minutes of Exercise per Session: Not on file  Stress:   . Feeling of Stress : Not on file  Social Connections:   . Frequency of Communication with Friends and Family: Not on file  . Frequency of Social Gatherings with Friends and Family:  Not on file  . Attends Religious Services: Not on file  . Active Member of Clubs or Organizations: Not on file  . Attends Banker Meetings: Not on file  . Marital Status: Not on file  Intimate Partner Violence:   . Fear of Current or Ex-Partner: Not on file  . Emotionally Abused: Not on file  . Physically Abused: Not on file  . Sexually Abused: Not on file    Family History  Problem Relation Age of Onset  . Fibromyalgia Mother   . Anxiety disorder Mother   . Heart attack Father 40  . CAD Father   . Heart attack Cousin     Past Medical History:  Diagnosis Date  . Anxiety   . Bursitis of left hip   . Depression   . Esophageal reflux   . Fibromyalgia   . Hearing loss   . Hyperlipemia   . Hyperlipidemia   . Hypertension   . Multiple sclerosis (HCC)   . Osteoarthritis   . PVC (premature ventricular contraction)   . Sciatica   . Tinnitus   . Uterine prolapse     Past Surgical History:  Procedure Laterality Date  . BREAST BIOPSY  05/24/2002  . BREAST BIOPSY Left 05/30/2016   negative  . CATARACT EXTRACTION, BILATERAL    . PARTIAL  HYSTERECTOMY    . surgical mesh     of uterine    Current Outpatient Medications  Medication Sig Dispense Refill  . ALPRAZolam (XANAX) 0.5 MG tablet Take 1 tablet (0.5 mg total) by mouth at bedtime as needed for anxiety. 2 tablet 0  . amitriptyline (ELAVIL) 50 MG tablet Take 25 mg by mouth at bedtime.    Marland Kitchen amLODipine (NORVASC) 5 MG tablet Take 5 mg by mouth daily.    Marland Kitchen aspirin EC 81 MG tablet Take 1 tablet (81 mg total) by mouth daily.    Marland Kitchen buPROPion (WELLBUTRIN XL) 150 MG 24 hr tablet Take 150 mg by mouth daily.    . DULoxetine (CYMBALTA) 60 MG capsule Take 60 mg by mouth daily.    Marland Kitchen levothyroxine (SYNTHROID, LEVOTHROID) 50 MCG tablet Take 50 mcg by mouth daily before breakfast.    . ocrelizumab (OCREVUS) 300 MG/10ML injection Inject 600 mg into the vein every 6 (six) months.    . pantoprazole (PROTONIX) 40 MG tablet Take 40 mg by mouth daily.    . predniSONE (STERAPRED UNI-PAK 21 TAB) 10 MG (21) TBPK tablet Prednisone taper pack as directed 21 tablet 0  . pregabalin (LYRICA) 100 MG capsule Take 100 mg by mouth 2 (two) times daily.     . propranolol (INDERAL) 10 MG tablet Take 1 tablet by mouth twice daily. 60 tablet 11  . rosuvastatin (CRESTOR) 5 MG tablet Take 5 mg by mouth daily.    . traMADol (ULTRAM) 50 MG tablet Take 1 tablet (50 mg total) by mouth every 12 (twelve) hours as needed. 24 tablet 1  . amphetamine-dextroamphetamine (ADDERALL) 10 MG tablet Take 5 mg by mouth daily as needed (enegry).     No current facility-administered medications for this visit.    Allergies as of 02/16/2019  . (No Known Allergies)   [facility] [address], [address] [place], [place] [address] [phone]  NEUROIMAGING REPORT    STUDY DATE: 02/10/2016 PATIENT NAME: Summer Holloway DOB: 11-11-1950 MRN: [id number]  EXAM: MRI Brain with and without contrast  ORDERING CLINICIAN: Melvyn Novas M.D. CLINICAL HISTORY: 69 year old woman with ataxia, hearing  loss and  frequent falls COMPARISON FILMS: None  TECHNIQUE:MRI of the brain with and without contrast was obtained utilizing 5 mm axial slices with T1, T2, T2 flair, SWI and diffusion weighted views.  T1 sagittal, T2 coronal and postcontrast views in the axial and coronal plane were obtained. CONTRAST: 15 ml [facility] IMAGING SITE: [facility], [address] [place].  FINDINGS: On sagittal images, the spinal cord is imaged caudally to C3 and is normal in caliber.   The contents of the posterior fossa are of normal size and position.   The pituitary gland and optic chiasm appear normal.    Brain volume appears normal.   The ventricles are normal in size and without distortion.  There are no abnormal extra-axial collections of fluid.    There are T2/FLAIR hyperintense foci in the right middle cerebellar peduncle and adjacent cerebellum, left posterior pons and in the periventricular and deep white matter of both hemispheres. None of the foci enhanced or appeared to be acute.  The deep gray matter appears normal.   Diffusion weighted images are normal.  Susceptibility weighted images are normal.    The orbits appear normal.   The VIIth/VIIIth nerve complex appears normal.  The mastoid air cells appear normal.  There are chronic inflammatory changes of passive finding the right maxillary sinus, many of the ethmoid air cells and the right frontal sinus. The left frontal recess and left frontal sinus show mucoperiosteal thickening.  Flow voids are identified within the major intracerebral arteries.     After the infusion of contrast material, a normal enhancement pattern is noted.   IMPRESSION:  This MRI of the brain with and without contrast shows the following: 1.    There are T2/FLAIR hyperintense foci in the left posterior pons, right middle cerebellar peduncle and in the periventricular and deep white matter of the hemispheres. This pattern is consistent with chronic demyelination as could be  seen with multiple sclerosis but could also be seen with chronic microvascular ischemic changes.     None of the foci appeared to be acute. Further clinical correlation is suggested. 2.    Chronic left maxillary, bilateral ethmoid and bilateral frontal sinusitis. 3.    There is a normal enhancement pattern.     INTERPRETING PHYSICIAN:  Richard A. Sater, MD, PhD Certified in  Neuroimaging by American Society of Neuroimaging   CSF Oligoclonal Bands REPORT   Comments: Bands noted          Reference Range: No bands  The patient's CSF contains (>5) well defined gamma  restriction bands. These bands indicate abnormal  synthesis of gammaglobulins in the central nervous  system. However, patient's corresponding serum was not  submitted and we are unable to define whether these  gammaglobulins are of systemic or intracerebral origin.  Oligoclonal bands are present in the CSF of more than  85% of patients with clinically definite multiple  sclerosis (MS). To distinguish between oligoclonal  bands in the CSF due to a peripheral gammopathy and  oligoclonal bands due to local production in the CNS,  serum and CSF should be tested simultaneously.  Oligoclonal bands can however be observed in a variety  of other diseases, e.g., subacute sclerosing panen-  cephalitis, inflammatory polyneuropathy, CNS lupus,  and brain tumors and infarctions. The clinical  significance of a numerical band count, determined  by isoelectric focusing, has not been definitively  defined. The data should be interpreted in conjunction  with all pertinent clinical and laboratory data  for  this patient.   Resulting Agency [facility]  Narrative      Vitals: BP 132/85   Pulse 71   Temp (!) 97.2 F (36.2 C)   Ht 5\' 8"  (1.727 m)   Wt 184 lb (83.5 kg)   BMI 27.98 kg/m  Last Weight:  Wt Readings from Last 1 Encounters:  02/16/19 184 lb (83.5 kg)   SWH:QPRF mass index is 27.98 kg/m.     Last Height:    Ht Readings from Last 1 Encounters:  02/16/19 5\' 8"  (1.727 m)    Physical exam:  General: The patient is awake, alert and appears not in acute distress. The patient is well groomed. Head: Normocephalic, atraumatic. Neck is supple. Mallampati 2  neck circumference: 14.5 . Nasal airflow patent , Skin:  Without evidence of edema, or rash Trunk: BMI is 28,  The patient's posture is erect, she has hyperlordosis   Neurologic exam : The patient is awake and alert, oriented to place and time.     Attention span & concentration ability appears normal.  Speech is fluent,  without dysarthria, dysphonia or aphasia.  Mood and affect are appropriate.  Cranial nerves: Pupils are equal and briskly reactive to light. Funduscopic exam without evidence of pallor or edema. No change in colour vision," red is red " Loss of acuity, diplopia, she reports horizontal diplopia.  Extraocular movements  in vertical and horizontal planes intact and without nystagmus. Visual fields by finger perimetry are intact.Hearing loss, evident in crowds and on the phone.  Facial sensation intact to fine touch. Facial motor strength is symmetric and tongue and uvula move midline. Shoulder shrug was symmetrical.  Motor exam:   Normal tone, muscle bulk and symmetric strength in all extremities. Good pinch strength, grip strength. Toe movements were strong . The patient is very flat feet and smallish musculature for the hip quadriceps, hamstrings and gastrocnemius muscle Sensory:  Fine touch, pinprick and vibration were tested in all extremities. Proprioception tested in the upper extremities was normal. Coordination: Rapid alternating movements in the fingers/hands was normal.  Finger-to-nose maneuver without evidence of ataxia, dysmetria and only mild , low amplitude tremor. Her hand will suddenly jerk,change in handwriting towards smaller and smaller script. .   Gait and station: Patient walks without assistive device and is  able unassisted to climb up to the exam table. Strength within normal limits. Stance is stable and wide based.   Her gait shows a tendency to sway, but turning is stable/Tandem gait remains fragmented.  She moves fast but f drifts to the right.  Turns still with 5 Steps, but gets off balance with this movement.  She moves without lumbar rotation, appears " stiff" "en bloc"  Romberg testing is positive, patient can compensate for push from the back but not so easily for push from the chest, revealing a tendency of retropulsion.  Deep tendon reflexes: in the upper and lower extremities are symmetric and intact. Babinski maneuver response is equilvocal.       Assessment:  After physical and neurologic examination, review of laboratory studies,  Personal review of imaging studies, reports of other /same  Imaging studies ,  Results of polysomnography/ neurophysiology testing and pre-existing records as far as provided in visit., my assessment is   1)  This patient has MS, always categorized as relapsing /remitting. CD MS diagnosis most likely explanation for clinical symptoms, MRI and CSF results. Stevan Born was started in 2018, and changed to OCREVUS 1 year ago. Every  6 month- 2) Ataxia with progression - demyelinating lesions are in cerebellum and dorsal left pons.  3) 3 years of diplopia- left eye weakness- horizontal diplopia. Fatigue triggers diplopia , too. No ptosis. 4) MCI/ cognitive decline, subjective/ MOCA 28/ 30.  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  I spent more than 45 minutes of face to face time with the patient in the presence of internal medicine resident Dr. Trilby Drummer, DO> . Greater than 50% of time was spent in counseling and coordination of care.    We have discussed the diagnosis and differential and I answered the patient's questions.    Plan:  Treatment plan and additional workup :  Ocrevus is well tolerated,  No  or slow  progression of MS.  Ataxia is subjectively worsening, and she reports frequent falls.  Cane and walker have not helped.   CMET and CBC diff   Asencion Partridge Ary Rudnick MD  02/16/2019   CC: Josetta Huddle, Md [address] [place],  [place] [address]

## 2019-02-16 NOTE — Telephone Encounter (Signed)
Pt has called asking for name and office # for the Internal Dr with Dr Vickey Huger today during the appointment.  Pt said his bedside manner was great and she may be in need of a new internal Dr soon.  Please call

## 2019-02-16 NOTE — Patient Instructions (Signed)
Ocrelizumab injection °What is this medicine? °OCRELIZUMAB (ok re LIZ ue mab) treats multiple sclerosis. It helps to decrease the number of multiple sclerosis relapses. It is not a cure. °This medicine may be used for other purposes; ask your health care provider or pharmacist if you have questions. °COMMON BRAND NAME(S): OCREVUS °What should I tell my health care provider before I take this medicine? °They need to know if you have any of these conditions: °· cancer °· hepatitis B infection °· other infection (especially a virus infection such as chickenpox, cold sores, or herpes) °· an unusual or allergic reaction to ocrelizumab, other medicines, foods, dyes or preservatives °· pregnant or trying to get pregnant °· breast-feeding °How should I use this medicine? °This medicine is for infusion into a vein. It is given by a health care professional in a hospital or clinic setting. °A special MedGuide will be given to you before each treatment. Be sure to read this information carefully each time. °Talk to your pediatrician regarding the use of this medicine in children. Special care may be needed. °Overdosage: If you think you have taken too much of this medicine contact a poison control center or emergency room at once. °NOTE: This medicine is only for you. Do not share this medicine with others. °What if I miss a dose? °Keep appointments for follow-up doses as directed. It is important not to miss your dose. Call your doctor or health care professional if you are unable to keep an appointment. °What may interact with this medicine? °· alemtuzumab °· daclizumab °· dimethyl fumarate °· fingolimod °· glatiramer °· interferon beta °· live virus vaccines °· mitoxantrone °· natalizumab °· peginterferon beta °· rituximab °· steroid medicines like prednisone or cortisone °· teriflunomide °This list may not describe all possible interactions. Give your health care provider a list of all the medicines, herbs,  non-prescription drugs, or dietary supplements you use. Also tell them if you smoke, drink alcohol, or use illegal drugs. Some items may interact with your medicine. °What should I watch for while using this medicine? °Tell your doctor or healthcare professional if your symptoms do not start to get better or if they get worse. °This medicine can cause serious allergic reactions. To reduce your risk you may need to take medicine before treatment with this medicine. Take your medicine as directed. °Women should inform their doctor if they wish to become pregnant or think they might be pregnant. There is a potential for serious side effects to an unborn child. Talk to your health care professional or pharmacist for more information. Female patients should use effective birth control methods while receiving this medicine and for 6 months after the last dose. °Call your doctor or health care professional for advice if you get a fever, chills or sore throat, or other symptoms of a cold or flu. Do not treat yourself. This drug decreases your body's ability to fight infections. Try to avoid being around people who are sick. °If you have a hepatitis B infection or a history of a hepatitis B infection, talk to your doctor. The symptoms of hepatitis B may get worse if you take this medicine. °In some patients, this medicine may cause a serious brain infection that may cause death. If you have any problems seeing, thinking, speaking, walking, or standing, tell your doctor right away. If you cannot reach your doctor, urgently seek other source of medical care. °This medicine can decrease the response to a vaccine. If you need to get   vaccinated, tell your healthcare professional if you have received this medicine. Extra booster doses may be needed. Talk to your doctor to see if a different vaccination schedule is needed. °Talk to your doctor about your risk of cancer. You may be more at risk for certain types of cancers if you  take this medicine. °What side effects may I notice from receiving this medicine? °Side effects that you should report to your doctor or health care professional as soon as possible: °· allergic reactions like skin rash, itching or hives, swelling of the face, lips, or tongue °· breathing problems °· facial flushing °· fast, irregular heartbeat °· lump or soreness in the breast °· signs and symptoms of herpes such as cold sore, shingles, or genital sores °· signs and symptoms of infection like fever or chills, cough, sore throat, pain or trouble passing urine °· signs and symptoms of low blood pressure like dizziness; feeling faint or lightheaded, falls; unusually weak or tired °· signs and symptoms of progressive multifocal leukoencephalopathy (PML) like changes in vision; clumsiness; confusion; personality changes; weakness on one side of the body °· swelling of the ankles, feet, hands °Side effects that usually do not require medical attention (report these to your doctor or health care professional if they continue or are bothersome): °· back pain °· depressed mood °· diarrhea °· pain, redness, or irritation at site where injected °This list may not describe all possible side effects. Call your doctor for medical advice about side effects. You may report side effects to FDA at 1-800-FDA-1088. °Where should I keep my medicine? °This drug is given in a hospital or clinic and will not be stored at home. °NOTE: This sheet is a summary. It may not cover all possible information. If you have questions about this medicine, talk to your doctor, pharmacist, or health care provider. °© 2020 Elsevier/Gold Standard (2018-01-04 07:41:53) ° °

## 2019-02-17 ENCOUNTER — Telehealth: Payer: Self-pay | Admitting: *Deleted

## 2019-02-17 DIAGNOSIS — E875 Hyperkalemia: Secondary | ICD-10-CM

## 2019-02-17 DIAGNOSIS — G35 Multiple sclerosis: Secondary | ICD-10-CM

## 2019-02-17 LAB — CBC WITH DIFFERENTIAL/PLATELET
Basophils Absolute: 0.1 10*3/uL (ref 0.0–0.2)
Basos: 1 %
EOS (ABSOLUTE): 0.1 10*3/uL (ref 0.0–0.4)
Eos: 1 %
Hematocrit: 35.1 % (ref 34.0–46.6)
Hemoglobin: 12.7 g/dL (ref 11.1–15.9)
Immature Grans (Abs): 0 10*3/uL (ref 0.0–0.1)
Immature Granulocytes: 0 %
Lymphocytes Absolute: 1 10*3/uL (ref 0.7–3.1)
Lymphs: 12 %
MCH: 32.2 pg (ref 26.6–33.0)
MCHC: 36.2 g/dL — ABNORMAL HIGH (ref 31.5–35.7)
MCV: 89 fL (ref 79–97)
Monocytes Absolute: 0.8 10*3/uL (ref 0.1–0.9)
Monocytes: 10 %
Neutrophils Absolute: 5.9 10*3/uL (ref 1.4–7.0)
Neutrophils: 76 %
Platelets: 458 10*3/uL — ABNORMAL HIGH (ref 150–450)
RBC: 3.95 x10E6/uL (ref 3.77–5.28)
RDW: 13.9 % (ref 11.7–15.4)
WBC: 7.9 10*3/uL (ref 3.4–10.8)

## 2019-02-17 LAB — COMPREHENSIVE METABOLIC PANEL
ALT: 9 IU/L (ref 0–32)
AST: 18 IU/L (ref 0–40)
Albumin/Globulin Ratio: 1.8 (ref 1.2–2.2)
Albumin: 4.2 g/dL (ref 3.8–4.8)
Alkaline Phosphatase: 157 IU/L — ABNORMAL HIGH (ref 39–117)
BUN/Creatinine Ratio: 17 (ref 12–28)
BUN: 16 mg/dL (ref 8–27)
Bilirubin Total: <0.2 mg/dL (ref 0.0–1.2)
CO2: 23 mmol/L (ref 20–29)
Calcium: 9.2 mg/dL (ref 8.7–10.3)
Chloride: 103 mmol/L (ref 96–106)
Creatinine, Ser: 0.94 mg/dL (ref 0.57–1.00)
GFR calc Af Amer: 72 mL/min/{1.73_m2} (ref >59)
GFR calc non Af Amer: 62 mL/min/{1.73_m2} (ref >59)
Globulin, Total: 2.4 g/dL (ref 1.5–4.5)
Glucose: 90 mg/dL (ref 65–99)
Potassium: 5.6 mmol/L — ABNORMAL HIGH (ref 3.5–5.2)
Sodium: 143 mmol/L (ref 134–144)
Total Protein: 6.6 g/dL (ref 6.0–8.5)

## 2019-02-17 NOTE — Progress Notes (Signed)
Elevated MCHC and platelet count. This was not present 3 month ago. Seems not to be dehydrated,  Please share with PCP.

## 2019-02-17 NOTE — Telephone Encounter (Signed)
I called pt and and relayed the labs results, her potassium is ellvated, she will come in in 1-2 wks times to get repeat.  I gave her lab time and days to come.

## 2019-02-17 NOTE — Telephone Encounter (Signed)
-----   Message from Amy Lomax, NP sent at 02/17/2019  7:50 AM EST ----- Please let her know her labs look good. Her potasium was elevated. Sometimes this happens if the sample sat for a period of time before being processed. She should have potassium rechecked in a week or two. We can do it here or she can get PCP to draw labs.  

## 2019-02-17 NOTE — Telephone Encounter (Signed)
-----   Message from Shawnie Dapper, NP sent at 02/17/2019  7:50 AM EST ----- Please let her know her labs look good. Her potasium was elevated. Sometimes this happens if the sample sat for a period of time before being processed. She should have potassium rechecked in a week or two. We can do it here or she can get PCP to draw labs.

## 2019-02-21 NOTE — Telephone Encounter (Signed)
Called the patient back to inform her that the the MD rotating is still in his residency rotation. He is suppose to be coming back in the office, I will talk with him and find out if he knows where he plans to be going and what is name is. Patient is planning to come in next Tuesday for lab work. Advised I would ask him next Monday so I can provide that information to her when she comes in. She was appreciative

## 2019-02-26 ENCOUNTER — Ambulatory Visit: Payer: Medicare PPO | Attending: Internal Medicine

## 2019-02-26 DIAGNOSIS — Z23 Encounter for immunization: Secondary | ICD-10-CM

## 2019-02-26 NOTE — Progress Notes (Signed)
   Covid-19 Vaccination Clinic  Name:  Summer Holloway    MRN: [id number] DOB: 08-08-1950  02/26/2019  Summer Holloway was observed post Covid-19 immunization for 15 minutes without incidence. She was provided with Vaccine Information Sheet and instruction to access the V-Safe system.   Summer Holloway was instructed to call 911 with any severe reactions post vaccine: Marland Kitchen Difficulty breathing  . Swelling of your face and throat  . A fast heartbeat  . A bad rash all over your body  . Dizziness and weakness    Immunizations Administered    Name Date Dose VIS Date Route   Pfizer COVID-19 Vaccine 02/26/2019  9:09 AM 0.3 mL 12/24/2018 Intramuscular   Manufacturer: [employer], [employer]   Lot: [id number]   NDC: [address]-3

## 2019-03-01 ENCOUNTER — Other Ambulatory Visit (INDEPENDENT_AMBULATORY_CARE_PROVIDER_SITE_OTHER): Payer: Self-pay

## 2019-03-01 DIAGNOSIS — G35 Multiple sclerosis: Secondary | ICD-10-CM

## 2019-03-01 DIAGNOSIS — Z0289 Encounter for other administrative examinations: Secondary | ICD-10-CM

## 2019-03-01 DIAGNOSIS — E875 Hyperkalemia: Secondary | ICD-10-CM

## 2019-03-02 ENCOUNTER — Telehealth: Payer: Self-pay

## 2019-03-02 LAB — POTASSIUM: Potassium: 4.5 mmol/L (ref 3.5–5.2)

## 2019-03-02 NOTE — Telephone Encounter (Signed)
Called pt to go over recent lab results. No answer, No VM prompt. Will send a MyChart message and send letter.  "Potassium looks good!"--NP Amy lomax

## 2019-03-10 ENCOUNTER — Ambulatory Visit
Admission: RE | Admit: 2019-03-10 | Discharge: 2019-03-10 | Disposition: A | Discharge status: Home / Self Care | Payer: Medicare PPO | Source: Ambulatory Visit | Attending: Internal Medicine | Admitting: Internal Medicine

## 2019-03-10 ENCOUNTER — Other Ambulatory Visit: Payer: Self-pay | Admitting: Internal Medicine

## 2019-03-10 ENCOUNTER — Other Ambulatory Visit: Payer: Self-pay

## 2019-03-10 DIAGNOSIS — R921 Mammographic calcification found on diagnostic imaging of breast: Secondary | ICD-10-CM

## 2019-03-16 ENCOUNTER — Other Ambulatory Visit (HOSPITAL_COMMUNITY): Payer: Self-pay | Admitting: Internal Medicine

## 2019-03-16 ENCOUNTER — Other Ambulatory Visit: Payer: Self-pay | Admitting: Internal Medicine

## 2019-03-16 DIAGNOSIS — R1011 Right upper quadrant pain: Secondary | ICD-10-CM

## 2019-03-18 ENCOUNTER — Ambulatory Visit: Payer: Self-pay | Admitting: Surgery

## 2019-03-18 NOTE — H&P (Signed)
Surgical H&P Requesting provider: Dr. Inda Merlin  Chief Complaint: abdominal pain  HPI: This is a lovely 69 year old woman referred by Dr. Inda Merlin for cholecystitis.  She developed severe upper abdominal pain associated with nausea and vomiting about 5 days ago.  This seemed to resolve on its own but she had residual upper abdominal pain.  She presented to her primary care doctor's office yesterday was noted to have significant epigastric tenderness.  An ultrasound confirmed cholelithiasis with no signs of cholecystitis, common bile duct 46mm- which is NORMAL; however labs demonstrated a white count of 22 and a very slightly elevated alkaline phosphatase.  She was started on antibiotics and today she is feeling significantly better.  Still little bit sore in the abdomen.  She has not had any fevers or jaundice throughout her course.  She has not had any prior abdominal surgeries.  No Known Allergies  Past Medical History:  Diagnosis Date   Anxiety    Bursitis of left hip    Depression    Esophageal reflux    Fibromyalgia    Hearing loss    Hyperlipemia    Hyperlipidemia    Hypertension    Multiple sclerosis (HCC)    Osteoarthritis    PVC (premature ventricular contraction)    Sciatica    Tinnitus    Uterine prolapse     Past Surgical History:  Procedure Laterality Date   BREAST BIOPSY  05/24/2002   BREAST BIOPSY Left 05/30/2016   negative   CATARACT EXTRACTION, BILATERAL     PARTIAL HYSTERECTOMY     surgical mesh     of uterine    Family History  Problem Relation Age of Onset   Fibromyalgia Mother    Anxiety disorder Mother    Heart attack Father 36   CAD Father    Heart attack Cousin     Social History   Socioeconomic History   Marital status: Married    Spouse name: Patrick Jupiter   Number of children: Not on file   Years of education: Not on file   Highest education level: Not on file  Occupational History   Not on file  Tobacco Use   Smoking status: Never Smoker   Smokeless tobacco: Never Used  Substance and Sexual Activity   Alcohol use: No   Drug use: Not Currently   Sexual activity: Not on file  Other Topics Concern   Not on file  Social History Narrative   Lives with husband   Social Determinants of Health   Financial Resource Strain:    Difficulty of Paying Living Expenses: Not on file  Food Insecurity:    Worried About Charity fundraiser in the Last Year: Not on file   [employer] of Food in the Last Year: Not on file  Transportation Needs:    Lack of Transportation (Medical): Not on file   Lack of Transportation (Non-Medical): Not on file  Physical Activity:    Days of Exercise per Week: Not on file   Minutes of Exercise per Session: Not on file  Stress:    Feeling of Stress : Not on file  Social Connections:    Frequency of Communication with Friends and Family: Not on file   Frequency of Social Gatherings with Friends and Family: Not on file   Attends Religious Services: Not on file   Active Member of Clubs or Organizations: Not on file   Attends Archivist Meetings: Not on file   Marital Status: Not on  file    Current Outpatient Medications on File Prior to Visit  Medication Sig Dispense Refill   ALPRAZolam (XANAX) 0.5 MG tablet Take 1 tablet (0.5 mg total) by mouth at bedtime as needed for anxiety. 2 tablet 0   amitriptyline (ELAVIL) 50 MG tablet Take 25 mg by mouth at bedtime.     amLODipine (NORVASC) 5 MG tablet Take 5 mg by mouth daily.     amphetamine-dextroamphetamine (ADDERALL) 10 MG tablet Take 5 mg by mouth daily as needed (enegry).     aspirin EC 81 MG tablet Take 1 tablet (81 mg total) by mouth daily.     buPROPion (WELLBUTRIN XL) 150 MG 24 hr tablet Take 150 mg by mouth daily.     DULoxetine (CYMBALTA) 60 MG capsule Take 60 mg by mouth daily.     levothyroxine (SYNTHROID, LEVOTHROID) 50 MCG tablet Take 50 mcg by mouth daily before breakfast.     ocrelizumab (OCREVUS) 300 MG/10ML injection Inject 600 mg  into the vein every 6 (six) months.     pantoprazole (PROTONIX) 40 MG tablet Take 40 mg by mouth daily.     predniSONE (STERAPRED UNI-PAK 21 TAB) 10 MG (21) TBPK tablet Prednisone taper pack as directed 21 tablet 0   pregabalin (LYRICA) 100 MG capsule Take 100 mg by mouth 2 (two) times daily.      propranolol (INDERAL) 10 MG tablet Take 1 tablet by mouth twice daily. 60 tablet 11   rosuvastatin (CRESTOR) 5 MG tablet Take 5 mg by mouth daily.     traMADol (ULTRAM) 50 MG tablet Take 1 tablet (50 mg total) by mouth every 12 (twelve) hours as needed. 24 tablet 1   No current facility-administered medications on file prior to visit.    Review of Systems: a complete, 10pt review of systems was completed with pertinent positives and negatives as documented in the HPI  Physical Exam: Vitals  Weight: 181.8 lb   Height: 67 in  Body Surface Area: 1.94 m   Body Mass Index: 28.47 kg/m   Temp.: 98.1 F    Pulse: 86 (Regular)    BP: 134/88 (Sitting, Left Arm, Standard)  She is alert, well-appearing Extraocular motions intact, no scleral icterus Unlabored respirations Abdomen is soft, nondistended, tender in the epigastrium more than the right subcostal region, very mildly tender in the lower abdomen as well. No mass, no peritonitis   CBC Latest Ref Rng & Units 02/16/2019 10/25/2018 03/24/2017  WBC 3.4 - 10.8 x10E3/uL 7.9 6.7 7.2  Hemoglobin 11.1 - 15.9 g/dL 12.7 12.6 14.6  Hematocrit 34.0 - 46.6 % 35.1 35.5 41.4  Platelets 150 - 450 x10E3/uL 458(H) 421 368    CMP Latest Ref Rng & Units 03/01/2019 02/16/2019 10/25/2018  Glucose 65 - 99 mg/dL - 90 85  BUN 8 - 27 mg/dL - 16 14  Creatinine 0.57 - 1.00 mg/dL - 0.94 0.94  Sodium 134 - 144 mmol/L - 143 142  Potassium 3.5 - 5.2 mmol/L 4.5 5.6(H) 4.8  Chloride 96 - 106 mmol/L - 103 103  CO2 20 - 29 mmol/L - 23 25  Calcium 8.7 - 10.3 mg/dL - 9.2 9.3  Total Protein 6.0 - 8.5 g/dL - 6.6 6.6  Total Bilirubin 0.0 - 1.2 mg/dL - <0.2 <0.2  Alkaline Phos 39  - 117 IU/L - 157(H) 150(H)  AST 0 - 40 IU/L - 18 17  ALT 0 - 32 IU/L - 9 6    No results found for: INR, PROTIME    Imaging: No results found.   A/P: CHOLECYSTITIS (K81.9) Story: Clinically this seems to be the most likely diagnosis. She has significantly improved on a low-fat diet and oral antibiotics. I recommend proceeding with laparoscopic cholecystectomy with cholangiogram. Discussed risks of surgery including bleeding, pain, scarring, intraabdominal injury specifically to the common bile duct and sequelae, conversion to open surgery, failure to resolve symptoms, blood clots/ pulmonary embolus, heart attack, pneumonia, stroke, death. Questions welcomed and answered to patient's satisfaction. Today she appears well enough that I think we can wait until next week to do her operation. Strongly advised her and her husband, who expressed understanding, that if the pain worsens again or she develops fevers or any concerning signs she absolutely needs to go to the emergency room this weekend to be admitted.  Patient Active Problem List   Diagnosis Date Noted   Essential hypertension 10/07/2017   Mixed hyperlipidemia 10/07/2017   Multiple sclerosis, relapsing-remitting (HCC) 04/14/2017   Atypical chest pain 11/03/2016   Dyspnea on exertion 11/03/2016   Palpitations 11/03/2016       Phylliss Blakes, MD [facility]  See AMION to contact appropriate on-call provider

## 2019-03-18 NOTE — H&P (View-Only) (Signed)
Surgical H&P Requesting provider: Dr. Inda Merlin  Chief Complaint: abdominal pain  HPI: This is a lovely 69 year old woman referred by Dr. Inda Merlin for cholecystitis.  She developed severe upper abdominal pain associated with nausea and vomiting about 5 days ago.  This seemed to resolve on its own but she had residual upper abdominal pain.  She presented to her primary care doctor's office yesterday was noted to have significant epigastric tenderness.  An ultrasound confirmed cholelithiasis with no signs of cholecystitis, common bile duct 46mm- which is NORMAL; however labs demonstrated a white count of 22 and a very slightly elevated alkaline phosphatase.  She was started on antibiotics and today she is feeling significantly better.  Still little bit sore in the abdomen.  She has not had any fevers or jaundice throughout her course.  She has not had any prior abdominal surgeries.  No Known Allergies  Past Medical History:  Diagnosis Date   Anxiety    Bursitis of left hip    Depression    Esophageal reflux    Fibromyalgia    Hearing loss    Hyperlipemia    Hyperlipidemia    Hypertension    Multiple sclerosis (HCC)    Osteoarthritis    PVC (premature ventricular contraction)    Sciatica    Tinnitus    Uterine prolapse     Past Surgical History:  Procedure Laterality Date   BREAST BIOPSY  05/24/2002   BREAST BIOPSY Left 05/30/2016   negative   CATARACT EXTRACTION, BILATERAL     PARTIAL HYSTERECTOMY     surgical mesh     of uterine    Family History  Problem Relation Age of Onset   Fibromyalgia Mother    Anxiety disorder Mother    Heart attack Father 36   CAD Father    Heart attack Cousin     Social History   Socioeconomic History   Marital status: Married    Spouse name: Patrick Jupiter   Number of children: Not on file   Years of education: Not on file   Highest education level: Not on file  Occupational History   Not on file  Tobacco Use   Smoking status: Never Smoker   Smokeless tobacco: Never Used  Substance and Sexual Activity   Alcohol use: No   Drug use: Not Currently   Sexual activity: Not on file  Other Topics Concern   Not on file  Social History Narrative   Lives with husband   Social Determinants of Health   Financial Resource Strain:    Difficulty of Paying Living Expenses: Not on file  Food Insecurity:    Worried About Charity fundraiser in the Last Year: Not on file   [employer] of Food in the Last Year: Not on file  Transportation Needs:    Lack of Transportation (Medical): Not on file   Lack of Transportation (Non-Medical): Not on file  Physical Activity:    Days of Exercise per Week: Not on file   Minutes of Exercise per Session: Not on file  Stress:    Feeling of Stress : Not on file  Social Connections:    Frequency of Communication with Friends and Family: Not on file   Frequency of Social Gatherings with Friends and Family: Not on file   Attends Religious Services: Not on file   Active Member of Clubs or Organizations: Not on file   Attends Archivist Meetings: Not on file   Marital Status: Not on  file    Current Outpatient Medications on File Prior to Visit  Medication Sig Dispense Refill   ALPRAZolam (XANAX) 0.5 MG tablet Take 1 tablet (0.5 mg total) by mouth at bedtime as needed for anxiety. 2 tablet 0   amitriptyline (ELAVIL) 50 MG tablet Take 25 mg by mouth at bedtime.     amLODipine (NORVASC) 5 MG tablet Take 5 mg by mouth daily.     amphetamine-dextroamphetamine (ADDERALL) 10 MG tablet Take 5 mg by mouth daily as needed (enegry).     aspirin EC 81 MG tablet Take 1 tablet (81 mg total) by mouth daily.     buPROPion (WELLBUTRIN XL) 150 MG 24 hr tablet Take 150 mg by mouth daily.     DULoxetine (CYMBALTA) 60 MG capsule Take 60 mg by mouth daily.     levothyroxine (SYNTHROID, LEVOTHROID) 50 MCG tablet Take 50 mcg by mouth daily before breakfast.     ocrelizumab (OCREVUS) 300 MG/10ML injection Inject 600 mg  into the vein every 6 (six) months.     pantoprazole (PROTONIX) 40 MG tablet Take 40 mg by mouth daily.     predniSONE (STERAPRED UNI-PAK 21 TAB) 10 MG (21) TBPK tablet Prednisone taper pack as directed 21 tablet 0   pregabalin (LYRICA) 100 MG capsule Take 100 mg by mouth 2 (two) times daily.      propranolol (INDERAL) 10 MG tablet Take 1 tablet by mouth twice daily. 60 tablet 11   rosuvastatin (CRESTOR) 5 MG tablet Take 5 mg by mouth daily.     traMADol (ULTRAM) 50 MG tablet Take 1 tablet (50 mg total) by mouth every 12 (twelve) hours as needed. 24 tablet 1   No current facility-administered medications on file prior to visit.    Review of Systems: a complete, 10pt review of systems was completed with pertinent positives and negatives as documented in the HPI  Physical Exam: Vitals  Weight: 181.8 lb   Height: 67 in  Body Surface Area: 1.94 m   Body Mass Index: 28.47 kg/m   Temp.: 98.1 F    Pulse: 86 (Regular)    BP: 134/88 (Sitting, Left Arm, Standard)  She is alert, well-appearing Extraocular motions intact, no scleral icterus Unlabored respirations Abdomen is soft, nondistended, tender in the epigastrium more than the right subcostal region, very mildly tender in the lower abdomen as well. No mass, no peritonitis   CBC Latest Ref Rng & Units 02/16/2019 10/25/2018 03/24/2017  WBC 3.4 - 10.8 x10E3/uL 7.9 6.7 7.2  Hemoglobin 11.1 - 15.9 g/dL 62.2 29.7 98.9  Hematocrit 34.0 - 46.6 % 35.1 35.5 41.4  Platelets 150 - 450 x10E3/uL 458(H) 421 368    CMP Latest Ref Rng & Units 03/01/2019 02/16/2019 10/25/2018  Glucose 65 - 99 mg/dL - 90 85  BUN 8 - 27 mg/dL - 16 14  Creatinine 2.11 - 1.00 mg/dL - 9.41 7.40  Sodium 814 - 144 mmol/L - 143 142  Potassium 3.5 - 5.2 mmol/L 4.5 5.6(H) 4.8  Chloride 96 - 106 mmol/L - 103 103  CO2 20 - 29 mmol/L - 23 25  Calcium 8.7 - 10.3 mg/dL - 9.2 9.3  Total Protein 6.0 - 8.5 g/dL - 6.6 6.6  Total Bilirubin 0.0 - 1.2 mg/dL - <4.8 <1.8  Alkaline Phos 39  - 117 IU/L - 157(H) 150(H)  AST 0 - 40 IU/L - 18 17  ALT 0 - 32 IU/L - 9 6    No results found for: INR, PROTIME  Imaging: No results found.   A/P: CHOLECYSTITIS (K81.9) Story: Clinically this seems to be the most likely diagnosis. She has significantly improved on a low-fat diet and oral antibiotics. I recommend proceeding with laparoscopic cholecystectomy with cholangiogram. Discussed risks of surgery including bleeding, pain, scarring, intraabdominal injury specifically to the common bile duct and sequelae, conversion to open surgery, failure to resolve symptoms, blood clots/ pulmonary embolus, heart attack, pneumonia, stroke, death. Questions welcomed and answered to patient's satisfaction. Today she appears well enough that I think we can wait until next week to do her operation. Strongly advised her and her husband, who expressed understanding, that if the pain worsens again or she develops fevers or any concerning signs she absolutely needs to go to the emergency room this weekend to be admitted.  Patient Active Problem List   Diagnosis Date Noted   Essential hypertension 10/07/2017   Mixed hyperlipidemia 10/07/2017   Multiple sclerosis, relapsing-remitting (HCC) 04/14/2017   Atypical chest pain 11/03/2016   Dyspnea on exertion 11/03/2016   Palpitations 11/03/2016       Phylliss Blakes, MD [facility]  See AMION to contact appropriate on-call provider

## 2019-03-19 ENCOUNTER — Other Ambulatory Visit: Payer: Self-pay

## 2019-03-19 ENCOUNTER — Ambulatory Visit: Payer: Medicare PPO | Attending: Internal Medicine

## 2019-03-19 DIAGNOSIS — Z23 Encounter for immunization: Secondary | ICD-10-CM | POA: Insufficient documentation

## 2019-03-19 NOTE — Progress Notes (Signed)
   Covid-19 Vaccination Clinic  Name:  Summer Holloway    MRN: [id number] DOB: 1950-07-03  03/19/2019  [name] was observed post Covid-19 immunization for 15 minutes without incident. She was provided with Vaccine Information Sheet and instruction to access the V-Safe system.   [name] was instructed to call 911 with any severe reactions post vaccine: Marland Kitchen Difficulty breathing  . Swelling of face and throat  . A fast heartbeat  . A bad rash all over body  . Dizziness and weakness   Immunizations Administered    Name Date Dose VIS Date Route   Pfizer COVID-19 Vaccine 03/19/2019 10:13 AM 0.3 mL 12/24/2018 Intramuscular   Manufacturer: [employer], [employer]   Lot: [id number]   NDC: [address]-4

## 2019-03-21 NOTE — Progress Notes (Addendum)
DUE TO COVID-19 ONLY ONE VISITOR IS ALLOWED TO COME WITH YOU AND STAY IN THE WAITING ROOM ONLY DURING PRE OP AND PROCEDURE DAY OF SURGERY. THE 1 VISITOR MAY VISIT WITH YOU AFTER SURGERY IN YOUR PRIVATE ROOM DURING VISITING HOURS ONLY!  YOU NEED TO HAVE A COVID 19 TEST ON___3/09/2019 at 0945am____ @_______ , THIS TEST MUST BE DONE BEFORE SURGERY, COME  [address], [place] , .  [facility]) ONCE YOUR COVID TEST IS COMPLETED, PLEASE BEGIN THE QUARANTINE INSTRUCTIONS AS OUTLINED IN YOUR HANDOUT.                Summer Holloway  03/21/2019   Your procedure is scheduled on:  03/24/2019   Report to [facility] Main  Entrance   Report to admitting at     0530 AM     Call this number if you have problems the morning of surgery [phone]    Remember: Do not eat food or drink liquids :After Midnight. BRUSH YOUR TEETH MORNING OF SURGERY AND RINSE YOUR MOUTH OUT, NO CHEWING GUM CANDY OR MINTS.     Take these medicines the morning of surgery with A SIP OF WATER:  , Wellbutrin, Cymbalta, Synthroid, Protonix, Lyrica, Propanolol ( Inderal)                                 You may not have any metal on your body including hair pins and              piercings  Do not wear jewelry, make-up, lotions, powders or perfumes, deodorant             Do not wear nail polish on your fingernails.  Do not shave  48 hours prior to surgery.             .   Do not bring valuables to the hospital. [place] IS NOT             RESPONSIBLE   FOR VALUABLES.  Contacts, dentures or bridgework may not be worn into surgery.  Leave suitcase in the car. After surgery it may be brought to your room.     Patients discharged the day of surgery will not be allowed to drive home. IF YOU ARE HAVING SURGERY AND GOING HOME THE SAME DAY, YOU MUST HAVE AN ADULT TO DRIVE YOU HOME AND BE WITH YOU FOR 24 HOURS. YOU MAY GO HOME BY TAXI OR UBER OR ORTHERWISE, BUT AN ADULT MUST ACCOMPANY YOU HOME  AND STAY WITH YOU FOR 24 HOURS.  Name and phone number of your driver:                Please read over the following fact sheets you were given: _____________________________________________________________________             [facility] - Preparing for Surgery Before surgery, you can play an important role.  Because skin is not sterile, your skin needs to be as free of germs as possible.  You can reduce the number of germs on your skin by washing with CHG (chlorahexidine gluconate) soap before surgery.  CHG is an antiseptic cleaner which kills germs and bonds with the skin to continue killing germs even after washing. Please DO NOT use if you have an allergy to CHG or antibacterial soaps.  If your skin becomes reddened/irritated stop using the CHG and inform your  nurse when you arrive at Short Stay. Do not shave (including legs and underarms) for at least 48 hours prior to the first CHG shower.  You may shave your face/neck. Please follow these instructions carefully:  1.  Shower with CHG Soap the night before surgery and the  morning of Surgery.  2.  If you choose to wash your hair, wash your hair first as usual with your  normal  shampoo.  3.  After you shampoo, rinse your hair and body thoroughly to remove the  shampoo.                           4.  Use CHG as you would any other liquid soap.  You can apply chg directly  to the skin and wash                       Gently with a scrungie or clean washcloth.  5.  Apply the CHG Soap to your body ONLY FROM THE NECK DOWN.   Do not use on face/ open                           Wound or open sores. Avoid contact with eyes, ears mouth and genitals (private parts).                       Wash face,  Genitals (private parts) with your normal soap.             6.  Wash thoroughly, paying special attention to the area where your surgery  will be performed.  7.  Thoroughly rinse your body with warm water from the neck down.  8.  DO NOT shower/wash with  your normal soap after using and rinsing off  the CHG Soap.                9.  Pat yourself dry with a clean towel.            10.  Wear clean pajamas.            11.  Place clean sheets on your bed the night of your first shower and do not  sleep with pets. Day of Surgery : Do not apply any lotions/deodorants the morning of surgery.  Please wear clean clothes to the hospital/surgery center.  FAILURE TO FOLLOW THESE INSTRUCTIONS MAY RESULT IN THE CANCELLATION OF YOUR SURGERY PATIENT SIGNATURE_________________________________  NURSE SIGNATURE__________________________________  ________________________________________________________________________

## 2019-03-22 ENCOUNTER — Other Ambulatory Visit: Payer: Self-pay

## 2019-03-22 ENCOUNTER — Encounter (HOSPITAL_COMMUNITY)
Admission: RE | Admit: 2019-03-22 | Discharge: 2019-03-22 | Disposition: A | Discharge status: Home / Self Care | Payer: Medicare PPO | Source: Ambulatory Visit | Attending: Surgery | Admitting: Surgery

## 2019-03-22 ENCOUNTER — Other Ambulatory Visit (HOSPITAL_COMMUNITY)
Admission: RE | Admit: 2019-03-22 | Discharge: 2019-03-22 | Disposition: A | Discharge status: Home / Self Care | Payer: Medicare PPO | Source: Ambulatory Visit | Attending: Surgery | Admitting: Surgery

## 2019-03-22 ENCOUNTER — Encounter (HOSPITAL_COMMUNITY): Payer: Self-pay

## 2019-03-22 DIAGNOSIS — Z01812 Encounter for preprocedural laboratory examination: Secondary | ICD-10-CM | POA: Insufficient documentation

## 2019-03-22 DIAGNOSIS — Z20822 Contact with and (suspected) exposure to covid-19: Secondary | ICD-10-CM | POA: Diagnosis not present

## 2019-03-22 HISTORY — DX: Hypothyroidism, unspecified: E03.9

## 2019-03-22 HISTORY — DX: Cardiac arrhythmia, unspecified: I49.9

## 2019-03-22 LAB — COMPREHENSIVE METABOLIC PANEL
ALT: 31 U/L (ref 0–44)
AST: 37 U/L (ref 15–41)
Albumin: 2.9 g/dL — ABNORMAL LOW (ref 3.5–5.0)
Alkaline Phosphatase: 368 U/L — ABNORMAL HIGH (ref 38–126)
Anion gap: 9 (ref 5–15)
BUN: 12 mg/dL (ref 8–23)
CO2: 29 mmol/L (ref 22–32)
Calcium: 8.7 mg/dL — ABNORMAL LOW (ref 8.9–10.3)
Chloride: 104 mmol/L (ref 98–111)
Creatinine, Ser: 0.94 mg/dL (ref 0.44–1.00)
GFR calc Af Amer: >60 mL/min (ref >60)
GFR calc non Af Amer: >60 mL/min (ref >60)
Glucose, Bld: 109 mg/dL — ABNORMAL HIGH (ref 70–99)
Potassium: 4.6 mmol/L (ref 3.5–5.1)
Sodium: 142 mmol/L (ref 135–145)
Total Bilirubin: 0.6 mg/dL (ref 0.3–1.2)
Total Protein: 6.7 g/dL (ref 6.5–8.1)

## 2019-03-22 LAB — CBC WITH DIFFERENTIAL/PLATELET
Abs Immature Granulocytes: 0.14 10*3/uL — ABNORMAL HIGH (ref 0.00–0.07)
Basophils Absolute: 0.1 10*3/uL (ref 0.0–0.1)
Basophils Relative: 1 %
Eosinophils Absolute: 0.2 10*3/uL (ref 0.0–0.5)
Eosinophils Relative: 2 %
HCT: 36.3 % (ref 36.0–46.0)
Hemoglobin: 11.8 g/dL — ABNORMAL LOW (ref 12.0–15.0)
Immature Granulocytes: 1 %
Lymphocytes Relative: 9 %
Lymphs Abs: 1 10*3/uL (ref 0.7–4.0)
MCH: 30.6 pg (ref 26.0–34.0)
MCHC: 32.5 g/dL (ref 30.0–36.0)
MCV: 94 fL (ref 80.0–100.0)
Monocytes Absolute: 0.9 10*3/uL (ref 0.1–1.0)
Monocytes Relative: 8 %
Neutro Abs: 9.4 10*3/uL — ABNORMAL HIGH (ref 1.7–7.7)
Neutrophils Relative %: 79 %
Platelets: 580 10*3/uL — ABNORMAL HIGH (ref 150–400)
RBC: 3.86 MIL/uL — ABNORMAL LOW (ref 3.87–5.11)
RDW: 15.5 % (ref 11.5–15.5)
WBC: 11.7 10*3/uL — ABNORMAL HIGH (ref 4.0–10.5)
nRBC: 0 % (ref 0.0–0.2)

## 2019-03-22 LAB — SARS CORONAVIRUS 2 (TAT 6-24 HRS): SARS Coronavirus 2: NEGATIVE

## 2019-03-22 NOTE — Progress Notes (Addendum)
Anesthesia Review:  PCP: Cardiologist :none  Chest x-ray : EKG :03/22/2019.  Echo : Cardiac Cath :  Sleep Study/ CPAP : Fasting Blood Sugar :      / Checks Blood Sugar -- times a day:   Blood Thinner/ Instructions /Last Dose: ASA / Instructions/ Last Dose :   Patient denies shortness of breath, chest pain, fever, and cough at this phone interview. Labs done on 03/22/2019 of CBC/DIFF/CMP and are complete in epic. Covid test done 03/22/2019.

## 2019-03-23 MED ORDER — BUPIVACAINE LIPOSOME 1.3 % IJ SUSP
20.0000 mL | Freq: Once | INTRAMUSCULAR | Status: DC
  Filled 2019-03-23: qty 20

## 2019-03-23 NOTE — Anesthesia Preprocedure Evaluation (Addendum)
Anesthesia Evaluation  Patient identified by MRN, date of birth, ID band Patient awake    Reviewed: Allergy & Precautions, NPO status , Patient's Chart, lab work & pertinent test results  Airway Mallampati: II  TM Distance: >3 FB Neck ROM: Full    Dental no notable dental hx.    Pulmonary neg pulmonary ROS,    Pulmonary exam normal breath sounds clear to auscultation       Cardiovascular hypertension, Pt. on medications Normal cardiovascular exam Rhythm:Regular Rate:Normal     Neuro/Psych MS  Neuromuscular disease negative psych ROS   GI/Hepatic negative GI ROS, Neg liver ROS,   Endo/Other  Hypothyroidism   Renal/GU negative Renal ROS  negative genitourinary   Musculoskeletal negative musculoskeletal ROS (+)   Abdominal   Peds negative pediatric ROS (+)  Hematology negative hematology ROS (+)   Anesthesia Other Findings   Reproductive/Obstetrics negative OB ROS                            Anesthesia Physical Anesthesia Plan  ASA: II  Anesthesia Plan: General   Post-op Pain Management:    Induction: Intravenous  PONV Risk Score and Plan: 3 and Ondansetron, Dexamethasone and Treatment may vary due to age or medical condition  Airway Management Planned: Oral ETT  Additional Equipment:   Intra-op Plan:   Post-operative Plan: Extubation in OR  Informed Consent: I have reviewed the patients History and Physical, chart, labs and discussed the procedure including the risks, benefits and alternatives for the proposed anesthesia with the patient or authorized representative who has indicated his/her understanding and acceptance.     Dental advisory given  Plan Discussed with: CRNA and Surgeon  Anesthesia Plan Comments:         Anesthesia Quick Evaluation

## 2019-03-24 ENCOUNTER — Ambulatory Visit (HOSPITAL_COMMUNITY)
Admission: RE | Admit: 2019-03-24 | Discharge: 2019-03-26 | Disposition: A | Discharge status: Home / Self Care | Payer: Medicare PPO | Source: Ambulatory Visit | Attending: Surgery | Admitting: Surgery

## 2019-03-24 ENCOUNTER — Encounter (HOSPITAL_COMMUNITY)
Admission: RE | Disposition: A | Discharge status: Home / Self Care | Payer: Self-pay | Source: Ambulatory Visit | Attending: Surgery

## 2019-03-24 ENCOUNTER — Ambulatory Visit (HOSPITAL_COMMUNITY): Payer: Medicare PPO

## 2019-03-24 ENCOUNTER — Encounter (HOSPITAL_COMMUNITY): Payer: Self-pay | Admitting: Surgery

## 2019-03-24 ENCOUNTER — Ambulatory Visit (HOSPITAL_COMMUNITY): Payer: Medicare PPO | Admitting: Physician Assistant

## 2019-03-24 ENCOUNTER — Other Ambulatory Visit: Payer: Self-pay

## 2019-03-24 ENCOUNTER — Ambulatory Visit (HOSPITAL_COMMUNITY): Payer: Medicare PPO | Admitting: Anesthesiology

## 2019-03-24 DIAGNOSIS — Z7952 Long term (current) use of systemic steroids: Secondary | ICD-10-CM | POA: Diagnosis not present

## 2019-03-24 DIAGNOSIS — E039 Hypothyroidism, unspecified: Secondary | ICD-10-CM | POA: Diagnosis not present

## 2019-03-24 DIAGNOSIS — G35 Multiple sclerosis: Secondary | ICD-10-CM | POA: Insufficient documentation

## 2019-03-24 DIAGNOSIS — M62838 Other muscle spasm: Secondary | ICD-10-CM | POA: Diagnosis not present

## 2019-03-24 DIAGNOSIS — R933 Abnormal findings on diagnostic imaging of other parts of digestive tract: Secondary | ICD-10-CM

## 2019-03-24 DIAGNOSIS — Z7982 Long term (current) use of aspirin: Secondary | ICD-10-CM | POA: Insufficient documentation

## 2019-03-24 DIAGNOSIS — M1990 Unspecified osteoarthritis, unspecified site: Secondary | ICD-10-CM | POA: Diagnosis not present

## 2019-03-24 DIAGNOSIS — F329 Major depressive disorder, single episode, unspecified: Secondary | ICD-10-CM | POA: Diagnosis not present

## 2019-03-24 DIAGNOSIS — F419 Anxiety disorder, unspecified: Secondary | ICD-10-CM | POA: Diagnosis not present

## 2019-03-24 DIAGNOSIS — K810 Acute cholecystitis: Secondary | ICD-10-CM | POA: Diagnosis present

## 2019-03-24 DIAGNOSIS — K8012 Calculus of gallbladder with acute and chronic cholecystitis without obstruction: Secondary | ICD-10-CM | POA: Diagnosis not present

## 2019-03-24 DIAGNOSIS — I10 Essential (primary) hypertension: Secondary | ICD-10-CM | POA: Diagnosis not present

## 2019-03-24 DIAGNOSIS — R7989 Other specified abnormal findings of blood chemistry: Secondary | ICD-10-CM | POA: Insufficient documentation

## 2019-03-24 DIAGNOSIS — Z79890 Hormone replacement therapy (postmenopausal): Secondary | ICD-10-CM | POA: Diagnosis not present

## 2019-03-24 DIAGNOSIS — R400 Somnolence: Secondary | ICD-10-CM | POA: Insufficient documentation

## 2019-03-24 DIAGNOSIS — K219 Gastro-esophageal reflux disease without esophagitis: Secondary | ICD-10-CM | POA: Insufficient documentation

## 2019-03-24 DIAGNOSIS — Z8249 Family history of ischemic heart disease and other diseases of the circulatory system: Secondary | ICD-10-CM | POA: Insufficient documentation

## 2019-03-24 DIAGNOSIS — Z79899 Other long term (current) drug therapy: Secondary | ICD-10-CM | POA: Diagnosis not present

## 2019-03-24 DIAGNOSIS — E782 Mixed hyperlipidemia: Secondary | ICD-10-CM | POA: Insufficient documentation

## 2019-03-24 DIAGNOSIS — K819 Cholecystitis, unspecified: Secondary | ICD-10-CM

## 2019-03-24 HISTORY — PX: CHOLECYSTECTOMY: SHX55

## 2019-03-24 SURGERY — LAPAROSCOPIC CHOLECYSTECTOMY WITH INTRAOPERATIVE CHOLANGIOGRAM
Anesthesia: General | Site: Abdomen

## 2019-03-24 MED ORDER — PHENYLEPHRINE 40 MCG/ML (10ML) SYRINGE FOR IV PUSH (FOR BLOOD PRESSURE SUPPORT)
PREFILLED_SYRINGE | INTRAVENOUS | Status: DC | PRN
  Administered 2019-03-24 (×2): 80 ug via INTRAVENOUS

## 2019-03-24 MED ORDER — DEXAMETHASONE SODIUM PHOSPHATE 10 MG/ML IJ SOLN
INTRAMUSCULAR | Status: DC | PRN
  Administered 2019-03-24: 10 mg via INTRAVENOUS

## 2019-03-24 MED ORDER — DOCUSATE SODIUM 100 MG PO CAPS: 100.0000 mg | ORAL_CAPSULE | Freq: Two times a day (BID) | ORAL | 0 refills | Status: AC

## 2019-03-24 MED ORDER — OXYCODONE HCL 5 MG PO TABS: 5.0000 mg | ORAL_TABLET | Freq: Once | ORAL | Status: DC | PRN

## 2019-03-24 MED ORDER — DEXAMETHASONE SODIUM PHOSPHATE 10 MG/ML IJ SOLN
INTRAMUSCULAR | Status: AC
  Filled 2019-03-24: qty 1

## 2019-03-24 MED ORDER — CEFAZOLIN SODIUM-DEXTROSE 2-4 GM/100ML-% IV SOLN
2.0000 g | INTRAVENOUS | Status: AC
  Administered 2019-03-24: 2 g via INTRAVENOUS

## 2019-03-24 MED ORDER — PROPOFOL 10 MG/ML IV BOLUS
INTRAVENOUS | Status: DC | PRN
  Administered 2019-03-24: 110 mg via INTRAVENOUS

## 2019-03-24 MED ORDER — ONDANSETRON 4 MG PO TBDP: 4.0000 mg | ORAL_TABLET | Freq: Four times a day (QID) | ORAL | Status: DC | PRN

## 2019-03-24 MED ORDER — TRAMADOL HCL 50 MG PO TABS
50.0000 mg | ORAL_TABLET | Freq: Four times a day (QID) | ORAL | Status: DC | PRN
  Administered 2019-03-24 – 2019-03-26 (×2): 50 mg via ORAL
  Filled 2019-03-24 (×3): qty 1

## 2019-03-24 MED ORDER — 0.9 % SODIUM CHLORIDE (POUR BTL) OPTIME
TOPICAL | Status: DC | PRN
  Administered 2019-03-24: 1000 mL

## 2019-03-24 MED ORDER — TRAMADOL HCL 50 MG PO TABS: 50.0000 mg | ORAL_TABLET | Freq: Four times a day (QID) | ORAL | 0 refills | Status: AC | PRN

## 2019-03-24 MED ORDER — ONDANSETRON HCL 4 MG/2ML IJ SOLN: 4.0000 mg | Freq: Four times a day (QID) | INTRAMUSCULAR | Status: DC | PRN

## 2019-03-24 MED ORDER — HYDROMORPHONE HCL 1 MG/ML IJ SOLN: 0.5000 mg | INTRAMUSCULAR | Status: DC | PRN

## 2019-03-24 MED ORDER — PROPRANOLOL HCL 10 MG PO TABS
10.0000 mg | ORAL_TABLET | Freq: Once | ORAL | Status: AC
  Administered 2019-03-24: 06:00:00 10 mg via ORAL
  Filled 2019-03-24: qty 1

## 2019-03-24 MED ORDER — DIPHENHYDRAMINE HCL 50 MG/ML IJ SOLN: 12.5000 mg | Freq: Four times a day (QID) | INTRAMUSCULAR | Status: DC | PRN

## 2019-03-24 MED ORDER — PROPOFOL 10 MG/ML IV BOLUS
INTRAVENOUS | Status: AC
  Filled 2019-03-24: qty 20

## 2019-03-24 MED ORDER — DOCUSATE SODIUM 100 MG PO CAPS
100.0000 mg | ORAL_CAPSULE | Freq: Two times a day (BID) | ORAL | Status: DC
  Administered 2019-03-24 – 2019-03-26 (×4): 100 mg via ORAL
  Filled 2019-03-24 (×4): qty 1

## 2019-03-24 MED ORDER — BUPIVACAINE HCL 0.25 % IJ SOLN
INTRAMUSCULAR | Status: AC
  Filled 2019-03-24: qty 1

## 2019-03-24 MED ORDER — CEFAZOLIN SODIUM-DEXTROSE 2-4 GM/100ML-% IV SOLN
INTRAVENOUS | Status: AC
  Filled 2019-03-24: qty 100

## 2019-03-24 MED ORDER — AMLODIPINE BESYLATE 5 MG PO TABS
5.0000 mg | ORAL_TABLET | ORAL | Status: DC
  Administered 2019-03-25: 5 mg via ORAL
  Filled 2019-03-24: qty 1

## 2019-03-24 MED ORDER — SUCCINYLCHOLINE CHLORIDE 200 MG/10ML IV SOSY
PREFILLED_SYRINGE | INTRAVENOUS | Status: AC
  Filled 2019-03-24: qty 10

## 2019-03-24 MED ORDER — MIDAZOLAM HCL 2 MG/2ML IJ SOLN
INTRAMUSCULAR | Status: AC
  Filled 2019-03-24: qty 2

## 2019-03-24 MED ORDER — FENTANYL CITRATE (PF) 250 MCG/5ML IJ SOLN
INTRAMUSCULAR | Status: DC | PRN
  Administered 2019-03-24 (×2): 50 ug via INTRAVENOUS

## 2019-03-24 MED ORDER — FENTANYL CITRATE (PF) 100 MCG/2ML IJ SOLN
INTRAMUSCULAR | Status: AC
  Filled 2019-03-24: qty 2

## 2019-03-24 MED ORDER — BUPROPION HCL ER (XL) 150 MG PO TB24
150.0000 mg | ORAL_TABLET | Freq: Every day | ORAL | Status: DC
  Administered 2019-03-24 – 2019-03-26 (×3): 150 mg via ORAL
  Filled 2019-03-24 (×3): qty 1

## 2019-03-24 MED ORDER — EPHEDRINE SULFATE-NACL 50-0.9 MG/10ML-% IV SOSY
PREFILLED_SYRINGE | INTRAVENOUS | Status: DC | PRN
  Administered 2019-03-24 (×4): 10 mg via INTRAVENOUS

## 2019-03-24 MED ORDER — PANTOPRAZOLE SODIUM 40 MG PO TBEC
40.0000 mg | DELAYED_RELEASE_TABLET | Freq: Every day | ORAL | Status: DC
  Administered 2019-03-24 – 2019-03-26 (×3): 40 mg via ORAL
  Filled 2019-03-24 (×3): qty 1

## 2019-03-24 MED ORDER — ONDANSETRON HCL 4 MG/2ML IJ SOLN
INTRAMUSCULAR | Status: DC | PRN
  Administered 2019-03-24 (×2): 4 mg via INTRAVENOUS

## 2019-03-24 MED ORDER — PROMETHAZINE HCL 25 MG/ML IJ SOLN: 6.2500 mg | INTRAMUSCULAR | Status: DC | PRN

## 2019-03-24 MED ORDER — LACTATED RINGERS IV SOLN: INTRAVENOUS | Status: DC | PRN

## 2019-03-24 MED ORDER — SUGAMMADEX SODIUM 200 MG/2ML IV SOLN
INTRAVENOUS | Status: DC | PRN
  Administered 2019-03-24: 300 mg via INTRAVENOUS

## 2019-03-24 MED ORDER — KETOROLAC TROMETHAMINE 15 MG/ML IJ SOLN: 15.0000 mg | Freq: Four times a day (QID) | INTRAMUSCULAR | Status: DC | PRN

## 2019-03-24 MED ORDER — LIDOCAINE 2% (20 MG/ML) 5 ML SYRINGE
INTRAMUSCULAR | Status: AC
  Filled 2019-03-24: qty 5

## 2019-03-24 MED ORDER — MIDAZOLAM HCL 5 MG/5ML IJ SOLN
INTRAMUSCULAR | Status: DC | PRN
  Administered 2019-03-24: 2 mg via INTRAVENOUS

## 2019-03-24 MED ORDER — BUPIVACAINE LIPOSOME 1.3 % IJ SUSP
INTRAMUSCULAR | Status: DC | PRN
  Administered 2019-03-24: 20 mL

## 2019-03-24 MED ORDER — ONDANSETRON HCL 4 MG/2ML IJ SOLN
INTRAMUSCULAR | Status: AC
  Filled 2019-03-24: qty 2

## 2019-03-24 MED ORDER — FENTANYL CITRATE (PF) 250 MCG/5ML IJ SOLN
INTRAMUSCULAR | Status: AC
  Filled 2019-03-24: qty 5

## 2019-03-24 MED ORDER — EPHEDRINE 5 MG/ML INJ
INTRAVENOUS | Status: AC
  Filled 2019-03-24: qty 10

## 2019-03-24 MED ORDER — AMITRIPTYLINE HCL 25 MG PO TABS
25.0000 mg | ORAL_TABLET | Freq: Every day | ORAL | Status: DC
  Administered 2019-03-24: 25 mg via ORAL
  Filled 2019-03-24 (×2): qty 1

## 2019-03-24 MED ORDER — LIDOCAINE 2% (20 MG/ML) 5 ML SYRINGE
INTRAMUSCULAR | Status: DC | PRN
  Administered 2019-03-24: 100 mg via INTRAVENOUS

## 2019-03-24 MED ORDER — METHOCARBAMOL 500 MG PO TABS
500.0000 mg | ORAL_TABLET | Freq: Once | ORAL | Status: AC
  Administered 2019-03-24: 500 mg via ORAL
  Filled 2019-03-24: qty 1

## 2019-03-24 MED ORDER — PHENYLEPHRINE HCL (PRESSORS) 10 MG/ML IV SOLN
INTRAVENOUS | Status: AC
  Filled 2019-03-24: qty 1

## 2019-03-24 MED ORDER — PREGABALIN 100 MG PO CAPS
100.0000 mg | ORAL_CAPSULE | Freq: Two times a day (BID) | ORAL | Status: DC
  Administered 2019-03-24 – 2019-03-26 (×3): 100 mg via ORAL
  Filled 2019-03-24 (×3): qty 1

## 2019-03-24 MED ORDER — LACTATED RINGERS IR SOLN
Status: DC | PRN
  Administered 2019-03-24: 2000 mL

## 2019-03-24 MED ORDER — METHOCARBAMOL 500 MG PO TABS
500.0000 mg | ORAL_TABLET | Freq: Four times a day (QID) | ORAL | Status: DC | PRN
  Administered 2019-03-24 – 2019-03-25 (×2): 500 mg via ORAL
  Filled 2019-03-24 (×2): qty 1

## 2019-03-24 MED ORDER — DULOXETINE HCL 60 MG PO CPEP
60.0000 mg | ORAL_CAPSULE | Freq: Every day | ORAL | Status: DC
  Administered 2019-03-24 – 2019-03-26 (×3): 60 mg via ORAL
  Filled 2019-03-24 (×3): qty 1

## 2019-03-24 MED ORDER — FENTANYL CITRATE (PF) 100 MCG/2ML IJ SOLN
25.0000 ug | INTRAMUSCULAR | Status: DC | PRN
  Administered 2019-03-24 (×2): 50 ug via INTRAVENOUS

## 2019-03-24 MED ORDER — CHLORHEXIDINE GLUCONATE 4 % EX LIQD: 60.0000 mL | Freq: Once | CUTANEOUS | Status: DC

## 2019-03-24 MED ORDER — GLYCOPYRROLATE PF 0.2 MG/ML IJ SOSY
PREFILLED_SYRINGE | INTRAMUSCULAR | Status: DC | PRN
  Administered 2019-03-24: .2 mg via INTRAVENOUS

## 2019-03-24 MED ORDER — DIPHENHYDRAMINE HCL 12.5 MG/5ML PO ELIX: 12.5000 mg | ORAL_SOLUTION | Freq: Four times a day (QID) | ORAL | Status: DC | PRN

## 2019-03-24 MED ORDER — AMPHETAMINE-DEXTROAMPHETAMINE 5 MG PO TABS: 5.0000 mg | ORAL_TABLET | Freq: Every day | ORAL | Status: DC | PRN

## 2019-03-24 MED ORDER — LEVOTHYROXINE SODIUM 50 MCG PO TABS
50.0000 ug | ORAL_TABLET | Freq: Every day | ORAL | Status: DC
  Administered 2019-03-25 – 2019-03-26 (×2): 50 ug via ORAL
  Filled 2019-03-24 (×3): qty 1

## 2019-03-24 MED ORDER — ROCURONIUM BROMIDE 10 MG/ML (PF) SYRINGE
PREFILLED_SYRINGE | INTRAVENOUS | Status: DC | PRN
  Administered 2019-03-24: 20 mg via INTRAVENOUS
  Administered 2019-03-24: 50 mg via INTRAVENOUS

## 2019-03-24 MED ORDER — PHENYLEPHRINE 40 MCG/ML (10ML) SYRINGE FOR IV PUSH (FOR BLOOD PRESSURE SUPPORT)
PREFILLED_SYRINGE | INTRAVENOUS | Status: AC
  Filled 2019-03-24: qty 20

## 2019-03-24 MED ORDER — KETOROLAC TROMETHAMINE 30 MG/ML IJ SOLN: 30.0000 mg | Freq: Once | INTRAMUSCULAR | Status: DC | PRN

## 2019-03-24 MED ORDER — SODIUM CHLORIDE 0.9 % IV SOLN
2.0000 g | INTRAVENOUS | Status: DC
  Administered 2019-03-24: 15:00:00 2 g via INTRAVENOUS
  Filled 2019-03-24 (×2): qty 20
  Filled 2019-03-24: qty 2

## 2019-03-24 MED ORDER — ACETAMINOPHEN 500 MG PO TABS
ORAL_TABLET | ORAL | Status: AC
  Administered 2019-03-24: 1000 mg via ORAL
  Filled 2019-03-24: qty 2

## 2019-03-24 MED ORDER — LACTATED RINGERS IV SOLN: INTRAVENOUS | Status: DC

## 2019-03-24 MED ORDER — ACETAMINOPHEN 500 MG PO TABS: 1000.0000 mg | ORAL_TABLET | ORAL | Status: AC

## 2019-03-24 MED ORDER — ACETAMINOPHEN 500 MG PO TABS
1000.0000 mg | ORAL_TABLET | Freq: Four times a day (QID) | ORAL | Status: DC
  Administered 2019-03-24 – 2019-03-26 (×5): 1000 mg via ORAL
  Filled 2019-03-24 (×6): qty 2

## 2019-03-24 MED ORDER — KETAMINE HCL 10 MG/ML IJ SOLN
INTRAMUSCULAR | Status: AC
  Filled 2019-03-24: qty 1

## 2019-03-24 MED ORDER — ROCURONIUM BROMIDE 10 MG/ML (PF) SYRINGE
PREFILLED_SYRINGE | INTRAVENOUS | Status: AC
  Filled 2019-03-24: qty 10

## 2019-03-24 MED ORDER — OXYCODONE HCL 5 MG/5ML PO SOLN: 5.0000 mg | Freq: Once | ORAL | Status: DC | PRN

## 2019-03-24 MED ORDER — PROPRANOLOL HCL 10 MG PO TABS
10.0000 mg | ORAL_TABLET | Freq: Two times a day (BID) | ORAL | Status: DC
  Administered 2019-03-24 – 2019-03-26 (×5): 10 mg via ORAL
  Filled 2019-03-24 (×5): qty 1

## 2019-03-24 MED ORDER — BUPIVACAINE HCL (PF) 0.25 % IJ SOLN
INTRAMUSCULAR | Status: DC | PRN
  Administered 2019-03-24: 30 mL

## 2019-03-24 MED ORDER — SODIUM CHLORIDE 0.9 % IV SOLN: INTRAVENOUS | Status: DC

## 2019-03-24 SURGICAL SUPPLY — 45 items
ADH SKN CLS APL DERMABOND .7 (GAUZE/BANDAGES/DRESSINGS) ×1
APL PRP STRL LF DISP 70% ISPRP (MISCELLANEOUS) ×1
APPLIER CLIP ROT 10 11.4 M/L (STAPLE) ×3
APR CLP MED LRG 11.4X10 (STAPLE) ×1
BAG SPEC RTRVL LRG 6X4 10 (ENDOMECHANICALS) ×1
CABLE HIGH FREQUENCY MONO STRZ (ELECTRODE) ×3 IMPLANT
CHLORAPREP W/TINT 26 (MISCELLANEOUS) ×3 IMPLANT
CLIP APPLIE ROT 10 11.4 M/L (STAPLE) ×1 IMPLANT
COVER MAYO STAND STRL (DRAPES) ×2 IMPLANT
COVER SURGICAL LIGHT HANDLE (MISCELLANEOUS) ×3 IMPLANT
COVER WAND RF STERILE (DRAPES) IMPLANT
DECANTER SPIKE VIAL GLASS SM (MISCELLANEOUS) ×3 IMPLANT
DERMABOND ADVANCED (GAUZE/BANDAGES/DRESSINGS) ×2
DERMABOND ADVANCED .7 DNX12 (GAUZE/BANDAGES/DRESSINGS) ×1 IMPLANT
DRAIN CHANNEL 19F RND (DRAIN) ×2 IMPLANT
DRAPE C-ARM 42X120 X-RAY (DRAPES) ×2 IMPLANT
ELECT REM PT RETURN 15FT ADLT (MISCELLANEOUS) ×3 IMPLANT
ENDOLOOP SUT PDS II  0 18 (SUTURE) ×6
ENDOLOOP SUT PDS II 0 18 (SUTURE) IMPLANT
EVACUATOR SILICONE 100CC (DRAIN) ×2 IMPLANT
GLOVE BIO SURGEON STRL SZ 6 (GLOVE) ×3 IMPLANT
GLOVE INDICATOR 6.5 STRL GRN (GLOVE) ×3 IMPLANT
GOWN STRL REUS W/TWL LRG LVL3 (GOWN DISPOSABLE) ×3 IMPLANT
GOWN STRL REUS W/TWL XL LVL3 (GOWN DISPOSABLE) ×6 IMPLANT
GRASPER SUT TROCAR 14GX15 (MISCELLANEOUS) IMPLANT
HEMOSTAT SNOW SURGICEL 2X4 (HEMOSTASIS) IMPLANT
KIT BASIN OR (CUSTOM PROCEDURE TRAY) ×3 IMPLANT
KIT TURNOVER KIT A (KITS) ×2 IMPLANT
NDL INSUFFLATION 14GA 120MM (NEEDLE) ×1 IMPLANT
NEEDLE INSUFFLATION 14GA 120MM (NEEDLE) ×3 IMPLANT
PENCIL SMOKE EVACUATOR (MISCELLANEOUS) IMPLANT
POUCH SPECIMEN RETRIEVAL 10MM (ENDOMECHANICALS) ×3 IMPLANT
SCISSORS LAP 5X35 DISP (ENDOMECHANICALS) ×3 IMPLANT
SET CHOLANGIOGRAPH MIX (MISCELLANEOUS) ×2 IMPLANT
SET IRRIG TUBING LAPAROSCOPIC (IRRIGATION / IRRIGATOR) ×3 IMPLANT
SET TUBE SMOKE EVAC HIGH FLOW (TUBING) ×3 IMPLANT
SLEEVE XCEL OPT CAN 5 100 (ENDOMECHANICALS) ×6 IMPLANT
SPONGE DRAIN TRACH 4X4 STRL 2S (GAUZE/BANDAGES/DRESSINGS) ×2 IMPLANT
SUT ETHILON 2 0 PS N (SUTURE) ×2 IMPLANT
SUT MNCRL AB 4-0 PS2 18 (SUTURE) ×3 IMPLANT
TOWEL OR 17X26 10 PK STRL BLUE (TOWEL DISPOSABLE) ×3 IMPLANT
TOWEL OR NON WOVEN STRL DISP B (DISPOSABLE) IMPLANT
TRAY LAPAROSCOPIC (CUSTOM PROCEDURE TRAY) ×3 IMPLANT
TROCAR BLADELESS OPT 5 100 (ENDOMECHANICALS) ×3 IMPLANT
TROCAR XCEL 12X100 BLDLESS (ENDOMECHANICALS) ×3 IMPLANT

## 2019-03-24 NOTE — Interval H&P Note (Signed)
History and Physical Interval Note:  03/24/2019 7:02 AM  Summer Holloway  has presented today for surgery, with the diagnosis of CHOLECYSTITIS.  The various methods of treatment have been discussed with the patient and family. After consideration of risks, benefits and other options for treatment, the patient has consented to  Procedure(s): LAPAROSCOPIC CHOLECYSTECTOMY WITH INTRAOPERATIVE CHOLANGIOGRAM (N/A) as a surgical intervention.  The patient's history has been reviewed, patient examined, no change in status, stable for surgery.  I have reviewed the patient's chart and labs.  Questions were answered to the patient's satisfaction.     Infiniti Hoefling Lollie Sails

## 2019-03-24 NOTE — Anesthesia Procedure Notes (Signed)
Procedure Name: Intubation Date/Time: 03/24/2019 7:33 AM Performed by: Mitzie Na, CRNA Pre-anesthesia Checklist: Patient identified, Emergency Drugs available, Suction available and Patient being monitored Patient Re-evaluated:Patient Re-evaluated prior to induction Oxygen Delivery Method: Circle system utilized Preoxygenation: Pre-oxygenation with 100% oxygen Induction Type: IV induction Ventilation: Mask ventilation without difficulty and Oral airway inserted - appropriate to patient size Laryngoscope Size: Mac and 3 Grade View: Grade I Tube type: Oral Tube size: 7.0 mm Number of attempts: 1 Airway Equipment and Method: Stylet and Oral airway Placement Confirmation: ETT inserted through vocal cords under direct vision,  positive ETCO2 and breath sounds checked- equal and bilateral Secured at: 23 cm Tube secured with: Tape Dental Injury: Teeth and Oropharynx as per pre-operative assessment

## 2019-03-24 NOTE — Anesthesia Postprocedure Evaluation (Signed)
Anesthesia Post Note  Patient: Summer Holloway  Procedure(s) Performed: LAPAROSCOPIC CHOLECYSTECTOMY (N/A Abdomen)     Patient location during evaluation: PACU Anesthesia Type: General Level of consciousness: awake and alert Pain management: pain level controlled Vital Signs Assessment: post-procedure vital signs reviewed and stable Respiratory status: spontaneous breathing, nonlabored ventilation, respiratory function stable and patient connected to nasal cannula oxygen Cardiovascular status: blood pressure returned to baseline and stable Postop Assessment: no apparent nausea or vomiting Anesthetic complications: no    Last Vitals:  Vitals:   03/24/19 1320 03/24/19 1418  BP: 125/73 129/81  Pulse: 79 70  Resp: 14 14  Temp: 36.6 C 36.7 C  SpO2: 96% 95%    Last Pain:  Vitals:   03/24/19 1320  TempSrc: Oral  PainSc:                  Orlanda Lemmerman S

## 2019-03-24 NOTE — Progress Notes (Signed)
Patient arrived from PACU and ambulated from stretcher in hall to bed. Tolerated well. Lina Sar, RN

## 2019-03-24 NOTE — Progress Notes (Signed)
Report  Called to Byron, Charity fundraiser on 3E

## 2019-03-24 NOTE — Transfer of Care (Signed)
Immediate Anesthesia Transfer of Care Note  Patient: Summer Holloway  Procedure(s) Performed: LAPAROSCOPIC CHOLECYSTECTOMY (N/A Abdomen)  Patient Location: PACU  Anesthesia Type:General  Level of Consciousness: awake, alert , oriented and patient cooperative  Airway & Oxygen Therapy: Patient Spontanous Breathing and Patient connected to face mask oxygen  Post-op Assessment: Report given to RN, Post -op Vital signs reviewed and stable and Patient moving all extremities  Post vital signs: Reviewed and stable  Last Vitals:  Vitals Value Taken Time  BP    Temp    Pulse 71 03/24/19 0954  Resp 15 03/24/19 0954  SpO2 100 % 03/24/19 0954  Vitals shown include unvalidated device data.  Last Pain:  Vitals:   03/24/19 0608  TempSrc: Oral         Complications: No apparent anesthesia complications

## 2019-03-24 NOTE — Op Note (Signed)
Operative Note  Summer Holloway 69 y.o. female [id number]  03/24/2019  Surgeon: Berna Bue MD FACS  Assistant: OR staff  Procedure performed: Laparoscopic Cholecystectomy  Procedure classification: urgent Infection present at time of surgery: yes. Purulent cholecystitis.   Preop diagnosis: cholecystitis Post-op diagnosis/intraop findings: severe purulent calculous cholecystitis   Specimens: gallbladder  Retained items: 60fr round blake drain  EBL: minimal  Complications: none  Description of procedure: After obtaining informed consent the patient was brought to the operating room. Antibiotics were administered. SCD's were applied. General endotracheal anesthesia was initiated and a formal time-out was performed. The abdomen was prepped and draped in the usual sterile fashion and the abdomen was entered using an infraumbilical veress needle after instilling the site with local. Insufflation to was obtained, 62mm trocar and camera inserted, and gross inspection revealed no evidence of injury from our entry or other intraabdominal abnormalities. Two 6mm trocars were introduced in the right midclavicular and right anterior axillary lines under direct visualization and following infiltration with local. An 45mm trocar was placed in the epigastrium.   The gallbladder was encased in dense omental adhesions and was adherent to the lateral aspect of the duodenum. These were carefully bluntly dissected. The gallbladder fundus was retracted cephalad and the infundibulum was retracted laterally. A combination of hook electrocautery and blunt dissection was utilized to clear the peritoneum from the neck and cystic duct, circumferentially isolating the cystic artery and cystic duct and lifting the gallbladder from the cystic plate. This was extremely difficult due to dense inflammatory changes in the area. The distal cystic duct is adherent to the common bile duct. The dissection was done  very carefully and meticulously, using cautery only on thin translucent strands of tissue in order to ensure no injury to adjacent structures.The critical view of safety was ultimately achieved with the cystic artery, cystic duct, and liver bed visualized between them with no other structures. The artery was diminutive and was divided with cautery. The cystic duct available for ligation was shortened due to distal adherence to the common bile duct, which was left undisturbed. Proceeded to continue dissecting the gallbladder from the liver plate. The posterior wall the gallbladder body and fundus was necrotic and inseparable from the liver.  Much of this was left in situ.  Innumerable small friable white stones were spilled and these were removed with a combination of suction and stone grasper.  Once the gallbladder was freed from the liver, two PDS Endoloops were employed to ligate the cystic duct at its junction with the gallbladder neck.  The gallbladder was then divided from the cystic duct, placed in Endo Catch bag and removed through the 11 mm trocar site.     A small amount of bleeding on the liver bed was controlled with cautery. The residual gallbladder wall, although necrotic was ablated with cautery as well.  The right upper quadrant was irrigated with 2 L of warm sterile saline and aspirated, the effluent was clear.  All visualized stones were removed.  The liver bed was inspected and confirmed to be hemostatic.  There was no bile leak from the liver bed or cystic duct and the PDS Endoloops were in good position.   A 19 French round Blake drain was inserted through the right lateral trocar site and placed in the liver bed.  This was secured to the skin with a 2-0 nylon. The 83mm trocar site in the epigastrium was closed with a 0 vicryl in the fascia under  direct visualization using a PMI device. The abdomen was desufflated and all trocars removed. The skin incisions were closed with subcuticular  monocryl and Dermabond. The patient was awakened, extubated and transported to the recovery room in stable condition.    All counts were correct at the completion of the case.

## 2019-03-25 ENCOUNTER — Ambulatory Visit (HOSPITAL_COMMUNITY): Payer: Medicare PPO

## 2019-03-25 DIAGNOSIS — K8012 Calculus of gallbladder with acute and chronic cholecystitis without obstruction: Secondary | ICD-10-CM | POA: Diagnosis not present

## 2019-03-25 LAB — CBC
HCT: 35.2 % — ABNORMAL LOW (ref 36.0–46.0)
Hemoglobin: 10.8 g/dL — ABNORMAL LOW (ref 12.0–15.0)
MCH: 28 pg (ref 26.0–34.0)
MCHC: 30.7 g/dL (ref 30.0–36.0)
MCV: 91.2 fL (ref 80.0–100.0)
Platelets: 553 10*3/uL — ABNORMAL HIGH (ref 150–400)
RBC: 3.86 MIL/uL — ABNORMAL LOW (ref 3.87–5.11)
RDW: 14.9 % (ref 11.5–15.5)
WBC: 17.6 10*3/uL — ABNORMAL HIGH (ref 4.0–10.5)
nRBC: 0 % (ref 0.0–0.2)

## 2019-03-25 LAB — COMPREHENSIVE METABOLIC PANEL
ALT: 62 U/L — ABNORMAL HIGH (ref 0–44)
AST: 125 U/L — ABNORMAL HIGH (ref 15–41)
Albumin: 2.8 g/dL — ABNORMAL LOW (ref 3.5–5.0)
Alkaline Phosphatase: 601 U/L — ABNORMAL HIGH (ref 38–126)
Anion gap: 11 (ref 5–15)
BUN: 10 mg/dL (ref 8–23)
CO2: 26 mmol/L (ref 22–32)
Calcium: 8.6 mg/dL — ABNORMAL LOW (ref 8.9–10.3)
Chloride: 103 mmol/L (ref 98–111)
Creatinine, Ser: 0.78 mg/dL (ref 0.44–1.00)
GFR calc Af Amer: >60 mL/min (ref >60)
GFR calc non Af Amer: >60 mL/min (ref >60)
Glucose, Bld: 121 mg/dL — ABNORMAL HIGH (ref 70–99)
Potassium: 4.6 mmol/L (ref 3.5–5.1)
Sodium: 140 mmol/L (ref 135–145)
Total Bilirubin: 0.2 mg/dL — ABNORMAL LOW (ref 0.3–1.2)
Total Protein: 6.3 g/dL — ABNORMAL LOW (ref 6.5–8.1)

## 2019-03-25 LAB — GLUCOSE, CAPILLARY
Glucose-Capillary: 73 mg/dL (ref 70–99)
Glucose-Capillary: 79 mg/dL (ref 70–99)
Glucose-Capillary: 116 mg/dL — ABNORMAL HIGH (ref 70–99)

## 2019-03-25 LAB — MAGNESIUM: Magnesium: 2.4 mg/dL (ref 1.7–2.4)

## 2019-03-25 LAB — SURGICAL PATHOLOGY

## 2019-03-25 MED ORDER — FLUMAZENIL 0.5 MG/5ML IV SOLN
0.2000 mg | Freq: Once | INTRAVENOUS | Status: AC
  Administered 2019-03-25: 0.2 mg via INTRAVENOUS
  Filled 2019-03-25: qty 5

## 2019-03-25 MED ORDER — GADOBUTROL 1 MMOL/ML IV SOLN
8.0000 mL | Freq: Once | INTRAVENOUS | Status: AC | PRN
  Administered 2019-03-25: 8 mL via INTRAVENOUS

## 2019-03-25 MED ORDER — METHOCARBAMOL 500 MG PO TABS: 500.0000 mg | ORAL_TABLET | Freq: Four times a day (QID) | ORAL | 1 refills | Status: DC | PRN

## 2019-03-25 MED ORDER — LORAZEPAM 2 MG/ML IJ SOLN
1.0000 mg | Freq: Once | INTRAMUSCULAR | Status: AC | PRN
  Administered 2019-03-25: 2 mg via INTRAVENOUS
  Filled 2019-03-25: qty 1

## 2019-03-25 NOTE — Discharge Instructions (Signed)
LAPAROSCOPIC SURGERY: POST OP INSTRUCTIONS  ######################################################################  EAT Gradually transition to a high fiber diet with a fiber supplement over the next few weeks after discharge.  Start with a pureed / full liquid diet (see below)  WALK Walk an hour a day.  Control your pain to do that.    CONTROL PAIN Control pain so that you can walk, sleep, tolerate sneezing/coughing, go up/down stairs.  HAVE A BOWEL MOVEMENT DAILY Keep your bowels regular to avoid problems.  OK to try a laxative to override constipation.  OK to use an antidairrheal to slow down diarrhea.  Call if not better after 2 tries  CALL IF YOU HAVE PROBLEMS/CONCERNS Call if you are still struggling despite following these instructions. Call if you have concerns not answered by these instructions  ######################################################################    1. DIET: Follow a light bland diet & liquids the first 24 hours after arrival home, such as soup, liquids, starches, etc.  Be sure to drink plenty of fluids.  Quickly advance to a usual solid diet within a few days.  Avoid fast food or heavy meals as your are more likely to get nauseated or have irregular bowels.  A low-sugar, high-fiber diet for the rest of your life is ideal.  2. Take your usually prescribed home medications unless otherwise directed.  3. PAIN CONTROL: a. Pain is best controlled by a usual combination of three different methods TOGETHER: i. Ice/Heat ii. Over the counter pain medication iii. Prescription pain medication b. Most patients will experience some swelling and bruising around the incisions.  Ice packs or heating pads (30-60 minutes up to 6 times a day) will help. Use ice for the first few days to help decrease swelling and bruising, then switch to heat to help relax tight/sore spots and speed recovery.  Some people prefer to use ice alone, heat alone, alternating between ice & heat.   Experiment to what works for you.  Swelling and bruising can take several weeks to resolve.   c. It is helpful to take an over-the-counter pain medication regularly for the first few days: i. Naproxen (Aleve, etc)  Two 220mg tabs twice a day OR Ibuprofen (Advil, etc) Three 200mg tabs four times a day (every meal & bedtime) AND ii. Acetaminophen (Tylenol, etc) 500-650mg four times a day (every meal & bedtime) d. A  prescription for pain medication (such as oxycodone, hydrocodone, tramadol, gabapentin, methocarbamol, etc) should be given to you upon discharge.  Take your pain medication as prescribed.  i. If you are having problems/concerns with the prescription medicine (does not control pain, nausea, vomiting, rash, itching, etc), please call us [phone] to see if we need to switch you to a different pain medicine that will work better for you and/or control your side effect better. ii. If you need a refill on your pain medication, please give us 48 hour notice.  contact your pharmacy.  They will contact our office to request authorization. Prescriptions will not be filled after 5 pm or on week-ends  4. Avoid getting constipated.   a. Between the surgery and the pain medications, it is common to experience some constipation.   b. Increasing fluid intake and taking a fiber supplement (such as Metamucil, Citrucel, FiberCon, MiraLax, etc) 1-2 times a day regularly will usually help prevent this problem from occurring.   c. A mild laxative (prune juice, Milk of Magnesia, MiraLax, etc) should be taken according to package directions if there are no bowel movements after 48   hours.   5. Watch out for diarrhea.   a. If you have many loose bowel movements, simplify your diet to bland foods & liquids for a few days.   b. Stop any stool softeners and decrease your fiber supplement.   c. Switching to mild anti-diarrheal medications (Kayopectate, Pepto Bismol) can help.   d. If this worsens or does not  improve, please call us.  6. Wash / shower every day.  You may shower over the skin glue which is waterproof.  Continue to shower over incision(s) after the dressing is off.  7. Glue will flake off after about 2 weeks.  You may leave the incision open to air.  You may replace a dressing/Band-Aid to cover the incision for comfort if you wish.   8. ACTIVITIES as tolerated:   a. You may resume regular (light) daily activities beginning the next day--such as daily self-care, walking, climbing stairs--gradually increasing activities as tolerated.  If you can walk 30 minutes without difficulty, it is safe to try more intense activity such as jogging, treadmill, bicycling, low-impact aerobics, swimming, etc. b. Save the most intensive and strenuous activity for last such as sit-ups, heavy lifting, contact sports, etc  Refrain from any heavy lifting or straining until you are off narcotics for pain control.   c. DO NOT PUSH THROUGH PAIN.  Let pain be your guide: If it hurts to do something, don't do it.  Pain is your body warning you to avoid that activity for another week until the pain goes down. d. You may drive when you are no longer taking prescription pain medication, you can comfortably wear a seatbelt, and you can safely maneuver your car and apply brakes. e. Dennis Bast may have sexual intercourse when it is comfortable.  9. FOLLOW UP in our office a. Please call [facility] at [phone] to set up an appointment to see your surgeon in the office for a follow-up appointment approximately 2-3 weeks after your surgery. b. Make sure that you call for this appointment the day you arrive home to insure a convenient appointment time.  10. IF YOU HAVE DISABILITY OR FAMILY LEAVE FORMS, BRING THEM TO THE OFFICE FOR PROCESSING.  DO NOT GIVE THEM TO YOUR DOCTOR.   WHEN TO CALL us [phone]: 1. Poor pain control 2. Reactions / problems with new medications (rash/itching, nausea, etc)  3. Fever over 101.5 F  (38.5 C) 4. Inability to urinate 5. Nausea and/or vomiting 6. Worsening swelling or bruising 7. Continued bleeding from incision. 8. Increased pain, redness, or drainage from the incision   The clinic staff is available to answer your questions during regular business hours (8:30am-5pm).  Please don't hesitate to call and ask to speak to one of our nurses for clinical concerns.   If you have a medical emergency, go to the nearest emergency room or call 911.  A surgeon from [facility] is always on call at the hospitals    [facility] Surgical drains are used to remove extra fluid that normally builds up in a surgical wound after surgery. A surgical drain helps to heal a surgical wound. Different kinds of surgical drains include:  Active drains. These drains use suction to pull drainage away from the surgical wound. Drainage flows through a tube to a container outside of the body. With these drains, you need to keep the bulb or the drainage container flat (compressed) at all times, except while you empty it. Flattening the bulb  or container creates suction.  Passive drains. These drains allow fluid to drain naturally, by gravity. Drainage flows through a tube to a bandage (dressing) or a container outside of the body. Passive drains do not need to be emptied. A drain is placed during surgery. Right after surgery, drainage is usually bright red and a little thicker than water. The drainage may gradually turn yellow or pink and become thinner. It is likely that your health care provider will remove the drain when the drainage stops or when the amount decreases to 1-2 Tbsp (15-30 mL) during a 24-hour period. Supplies needed:  Tape.  Germ-free cleaning solution (sterile saline).  Cotton swabs.  Split gauze drain sponge: 4 x 4 inches (10 x 10 cm).  Gauze square: 4 x 4 inches (10 x 10 cm). How to care for your surgical drain Care for your drain as told by your  health care provider. This is important to help prevent infection. If your drain is placed at your back, or any other hard-to-reach area, ask another person to assist you in performing the following tasks: General care  Keep the skin around the drain dry and covered with a dressing at all times.  Check your drain area every day for signs of infection. Check for: ? Redness, swelling, or pain. ? Pus or a bad smell. ? Cloudy drainage. ? Tenderness or pressure at the drain exit site. Changing the dressing Follow instructions from your health care provider about how to change your dressing. Change your dressing at least once a day. Change it more often if needed to keep the dressing dry. Make sure you: 1. Gather your supplies. 2. Wash your hands with soap and water before you change your dressing. If soap and water are not available, use hand sanitizer. 3. Remove the old dressing. Avoid using scissors to do that. 4. Wash your hands with soap and water again after removing the old dressing. 5. Use sterile saline to clean your skin around the drain. You may need to use a cotton swab to clean the skin. 6. Place the tube through the slit in a drain sponge. Place the drain sponge so that it covers your wound. 7. Place the gauze square or another drain sponge on top of the drain sponge that is on the wound. Make sure the tube is between those layers. 8. Tape the dressing to your skin. 9. Tape the drainage tube to your skin 1-2 inches (2.5-5 cm) below the place where the tube enters your body. Taping keeps the tube from pulling on any stitches (sutures) that you have. 10. Wash your hands with soap and water. 11. Write down the color of your drainage and how often you change your dressing. How to empty your active drain  1. Make sure that you have a measuring cup that you can empty your drainage into. 2. Wash your hands with soap and water. If soap and water are not available, use hand  sanitizer. 3. Loosen any pins or clips that hold the tube in place. 4. If your health care provider tells you to strip the tube to prevent clots and tube blockages: ? Hold the tube at the skin with one hand. Use your other hand to pinch the tubing with your thumb and first finger. ? Gently move your fingers down the tube while squeezing very lightly. This clears any drainage, clots, or tissue from the tube. ? You may need to do this several times each day to keep the  tube clear. Do not pull on the tube. 5. Open the bulb cap or the drain plug. Do not touch the inside of the cap or the bottom of the plug. 6. Turn the device upside down and gently squeeze. 7. Empty all of the drainage into the measuring cup. 8. Compress the bulb or the container and replace the cap or the plug. To compress the bulb or the container, squeeze it firmly in the middle while you close the cap or plug the container. 9. Write down the amount of drainage that you have in each 24-hour period. If you have less than 2 Tbsp (30 mL) of drainage during 24 hours, contact your health care provider. 10. Flush the drainage down the toilet. 11. Wash your hands with soap and water. Contact a health care provider if:  You have redness, swelling, or pain around your drain area.  You have pus or a bad smell coming from your drain area.  You have a fever or chills.  The skin around your drain is warm to the touch.  The amount of drainage that you have is increasing instead of decreasing.  You have drainage that is cloudy.  There is a sudden stop or a sudden decrease in the amount of drainage that you have.  Your drain tube falls out.  Your active drain does not stay compressed after you empty it. Summary  Surgical drains are used to remove extra fluid that normally builds up in a surgical wound after surgery.  Different kinds of surgical drains include active drains and passive drains. Active drains use suction to pull  drainage away from the surgical wound, and passive drains allow fluid to drain naturally.  It is important to care for your drain to prevent infection. If your drain is placed at your back, or any other hard-to-reach area, ask another person to assist you.  Contact your health care provider if you have redness, swelling, or pain around your drain area. This information is not intended to replace advice given to you by your health care provider. Make sure you discuss any questions you have with your health care provider. Document Revised: 02/03/2018 Document Reviewed: 02/03/2018 Elsevier Patient Education  2020 [employer].    [facility], [place] [address], [address], [place], [place]  [address] ? MAIN: [phone] ? TOLL FREE: [phone] ?  FAX [phone] [email or web address]

## 2019-03-25 NOTE — Discharge Summary (Signed)
Physician Discharge Summary  Patient ID: Seneca Gadbois MRN: [id number] DOB/AGE: 1950/11/15 69 y.o.  Admit date: 03/24/2019 Discharge date: 03/25/2019  Admission Diagnoses:  Discharge Diagnoses:  Active Problems:   Acute cholecystitis   Discharged Condition: good  Hospital Course: Admitted for supportive care following laparoscopic cholecystectomy.  She progressed well.  Due to elevated liver function tests she underwent an MRCP on postop day 1 which was negative for any ductal injury, choledocholithiasis or other etiology.  She was tolerating a diet, ambulate without assistance, and her pain was well-controlled.  She was persistently drowsy that evening due to the Ativan she required for the MRI and therefore remained another evening in the hospital.  By postop day 2, she was deemed stable for discharge home.  Consults: None  Significant Diagnostic Studies: MRCP  Treatments: surgery: as above  Discharge Exam: Blood pressure 139/80, pulse 65, temperature 98 F (36.7 C), temperature source Oral, resp. rate 18, height 5' 7.5" (1.715 m), weight 83.5 kg, SpO2 97 %. see rounding note  Disposition: Discharge disposition: 01-Home or Self Care       Discharge Instructions     Increase activity slowly   Complete by: As directed       Allergies as of 03/25/2019       Reactions   Pizza Flavor Nausea And Vomiting        Medication List     STOP taking these medications    ALPRAZolam 0.5 MG tablet Commonly known as: XANAX   predniSONE 10 MG (21) Tbpk tablet Commonly known as: STERAPRED UNI-PAK 21 TAB       TAKE these medications    amitriptyline 50 MG tablet Commonly known as: ELAVIL Take 25 mg by mouth at bedtime.   amLODipine 5 MG tablet Commonly known as: NORVASC Take 5 mg by mouth daily. Per patient she takes on Mon, Wed, Friday   amphetamine-dextroamphetamine 10 MG tablet Commonly known as: ADDERALL Take 5 mg by mouth daily as needed (enegry).    buPROPion 150 MG 24 hr tablet Commonly known as: WELLBUTRIN XL Take 150 mg by mouth daily.   docusate sodium 100 MG capsule Commonly known as: Colace Take 1 capsule (100 mg total) by mouth 2 (two) times daily. OK to decrease to once daily or discontinue if having loose bowel movements   DULoxetine 60 MG capsule Commonly known as: CYMBALTA Take 60 mg by mouth daily.   levothyroxine 50 MCG tablet Commonly known as: SYNTHROID Take 50 mcg by mouth daily before breakfast.   methocarbamol 500 MG tablet Commonly known as: Robaxin Take 1 tablet (500 mg total) by mouth every 6 (six) hours as needed for muscle spasms.   ocrelizumab 300 MG/10ML injection Commonly known as: OCREVUS Inject 600 mg into the vein every 6 (six) months.   pantoprazole 40 MG tablet Commonly known as: PROTONIX Take 40 mg by mouth daily.   pregabalin 100 MG capsule Commonly known as: LYRICA Take 100 mg by mouth 2 (two) times daily.   propranolol 10 MG tablet Commonly known as: INDERAL Take 1 tablet by mouth twice daily.   rosuvastatin 5 MG tablet Commonly known as: CRESTOR Take 5 mg by mouth every Monday, Wednesday, and Friday.   traMADol 50 MG tablet Commonly known as: Ultram Take 1 tablet (50 mg total) by mouth every 6 (six) hours as needed. For pain not relieved by tylenol or ibuprofen What changed:  when to take this additional instructions       Follow-up Information  Berna Bue, MD In 3 weeks.   Specialty: General Surgery Contact information: [address] [place] [address] [phone]         [facility] Follow up in 2 week(s).   Specialty: General Surgery Why: drain check Contact information: [address] [place] [address] [phone]            Signed: Berna Bue 03/25/2019, 3:06 PM

## 2019-03-25 NOTE — Progress Notes (Signed)
S: Feels well. Slept well, has had regular food without n/v or pain, and is working on IS and walking. Still having some muscle spasm type pain around the RUQ port sites, but better with robaxin.   O: Vitals, labs, intake/output, and orders reviewed at this time.  Gen: A&Ox3, no distress Chest: unlabored respirations, RRR Abd: soft, appropriately tender with evolving ecchymosis around epigastric incision. Incisions c/d/i with dermabond, no cellulitis or hematoma. JP output serosanguinous and low volume. Ext: warm, no edema Neuro: grossly normal  Lines/tubes/drains: PIV  A/P: POD 1 laparoscopic cholecystectomy. She is doing well overall. Her alk phos is increased (even moreso than preop) and minimal elevation of AST/ALT, bilirubin remains normal. Cholangiogram was not possible during surgery. 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 If MRCP is normal she can be discharged today.    Romana Juniper, MD [facility], [place] Pager [phone]

## 2019-03-25 NOTE — Significant Event (Signed)
Rapid Response Event Note  Overview: Time Called: 1723 Arrival Time: 1727 Event Type: Other (Comment)(Possible Benzodiazepine overdose) Notified by bedside RN, Sherron Ales, in regards to patient being very drowsy and hard to arouse. Upon arrival had to provide sternal rub to get patient to start answering questions and follow commands. Patient would quickly go back to sleep. Respirations were slightly diminished and at rate of 10-12. Vital signs appeared to be WNL. CBG was 79 but patient also received 2mg  of Ativan at 1357 for MRCP. Patient has been drowsy since coming back from scan.   Initial Focused Assessment: Neuro: Drowsy, sternal rub applied for patient to follow commands or answer questions but patient would go back to sleep quickly. CBG 79. Temp 98.1  Cardiac: BP WNL, pulse 60s Pulmonary: No signs of respiratory distress, diminished respirations, rate 10-12   Interventions: Notified MD for Romazicon 0.2mg -antidote for Ativan  -1-99mins after patient received, patient was responding more and alert  Once patient was awake, had patient drink a small coke to improve CBG.   Plan of Care (if not transferred): Recheck CBG within 20-30mins after drinking coke, if patient becomes drowsy again notify Rapid Response 04-02-1990).    Event Summary:   at      at          [facility]

## 2019-03-25 NOTE — Progress Notes (Addendum)
   03/25/19 1642  Vitals  Temp 98.5 F (36.9 C)  Temp Source Oral  BP (!) 153/85  MAP (mmHg) 106  BP Location Right Arm  BP Method Automatic  Patient Position (if appropriate) Lying  Pulse Rate 65  Pulse Rate Source Monitor  Resp 17  Oxygen Therapy  SpO2 95 %  O2 Device Room Air  Pt received ativan at 1400 for scan. pt seems very drowsy. MD made aware. Despite discharge orders,  patient will stay until she is more alert. Will continue to monitor.

## 2019-03-26 DIAGNOSIS — K8012 Calculus of gallbladder with acute and chronic cholecystitis without obstruction: Secondary | ICD-10-CM | POA: Diagnosis not present

## 2019-03-26 NOTE — Discharge Summary (Signed)
Physician Discharge Summary  Patient ID: Makylie Rivere MRN: [id number] DOB/AGE: 1950-10-29 69 y.o.  Admit date: 03/24/2019 Discharge date: 03/26/2019  Admission Diagnoses:  Discharge Diagnoses:  Active Problems:   Acute cholecystitis   Discharged Condition: good  Hospital Course: Admitted status post lap chole.  Underwent an MRCP the next morning which was unremarkable.  Discharged home postoperative day 2 doing well.  Consults: None  Significant Diagnostic Studies:   Treatments: surgery: lap chole  Discharge Exam: Blood pressure 133/68, pulse 62, temperature 97.7 F (36.5 C), temperature source Oral, resp. rate 14, height 5' 7.5" (1.715 m), weight 83.5 kg, SpO2 93 %. General appearance: alert, cooperative and no distress Resp: clear to auscultation bilaterally Cardio: regular rate and rhythm, S1, S2 normal, no murmur, click, rub or gallop Incision/Wound:abdomen soft, incisions clean, drain serosang  Disposition: Discharge disposition: 01-Home or Self Care       Discharge Instructions    Increase activity slowly   Complete by: As directed      Allergies as of 03/26/2019      Reactions   Pizza Flavor Nausea And Vomiting      Medication List    STOP taking these medications   ALPRAZolam 0.5 MG tablet Commonly known as: XANAX   predniSONE 10 MG (21) Tbpk tablet Commonly known as: STERAPRED UNI-PAK 21 TAB     TAKE these medications   amitriptyline 50 MG tablet Commonly known as: ELAVIL Take 25 mg by mouth at bedtime.   amLODipine 5 MG tablet Commonly known as: NORVASC Take 5 mg by mouth daily. Per patient she takes on Mon, Wed, Friday   amphetamine-dextroamphetamine 10 MG tablet Commonly known as: ADDERALL Take 5 mg by mouth daily as needed (enegry).   buPROPion 150 MG 24 hr tablet Commonly known as: WELLBUTRIN XL Take 150 mg by mouth daily.   docusate sodium 100 MG capsule Commonly known as: Colace Take 1 capsule (100 mg total) by mouth 2  (two) times daily. OK to decrease to once daily or discontinue if having loose bowel movements   DULoxetine 60 MG capsule Commonly known as: CYMBALTA Take 60 mg by mouth daily.   levothyroxine 50 MCG tablet Commonly known as: SYNTHROID Take 50 mcg by mouth daily before breakfast.   methocarbamol 500 MG tablet Commonly known as: Robaxin Take 1 tablet (500 mg total) by mouth every 6 (six) hours as needed for muscle spasms.   ocrelizumab 300 MG/10ML injection Commonly known as: OCREVUS Inject 600 mg into the vein every 6 (six) months.   pantoprazole 40 MG tablet Commonly known as: PROTONIX Take 40 mg by mouth daily.   pregabalin 100 MG capsule Commonly known as: LYRICA Take 100 mg by mouth 2 (two) times daily.   propranolol 10 MG tablet Commonly known as: INDERAL Take 1 tablet by mouth twice daily.   rosuvastatin 5 MG tablet Commonly known as: CRESTOR Take 5 mg by mouth every Monday, Wednesday, and Friday.   traMADol 50 MG tablet Commonly known as: Ultram Take 1 tablet (50 mg total) by mouth every 6 (six) hours as needed. For pain not relieved by tylenol or ibuprofen What changed:   when to take this  additional instructions      Follow-up Information    Berna Bue, MD In 3 weeks.   Specialty: General Surgery Contact information: [address] [place] [address] [phone]        [facility] Follow up in 1 week(s).  Specialty: General Surgery Why: drain check Contact information: [place] [address] [phone]           Signed: Coralie Keens 03/26/2019, 8:09 AM

## 2019-03-26 NOTE — Progress Notes (Signed)
Patient ID: Summer Holloway, female   DOB: May 18, 1950, 69 y.o.   MRN: [id number]   Feeling better today Pain controlled Abdomen is soft and drain output is serosanguineous  Plan: Discharge home

## 2019-03-26 NOTE — Progress Notes (Signed)
D/C instructions given to patient. Patient had no questions. Patient educated on how to empty her JP drain and how to change dressing daily. NT or writer will wheel patient out once her family comes in

## 2019-04-04 ENCOUNTER — Encounter (HOSPITAL_COMMUNITY): Payer: Self-pay

## 2019-04-04 ENCOUNTER — Encounter (HOSPITAL_COMMUNITY): Payer: Medicare PPO | Attending: Internal Medicine

## 2019-04-21 NOTE — Progress Notes (Unsigned)
This patient has MS, relapsing /remitting. CD

## 2019-05-09 DIAGNOSIS — E039 Hypothyroidism, unspecified: Secondary | ICD-10-CM | POA: Diagnosis not present

## 2019-05-09 DIAGNOSIS — F329 Major depressive disorder, single episode, unspecified: Secondary | ICD-10-CM | POA: Diagnosis not present

## 2019-05-09 DIAGNOSIS — F322 Major depressive disorder, single episode, severe without psychotic features: Secondary | ICD-10-CM | POA: Diagnosis not present

## 2019-05-09 DIAGNOSIS — G4700 Insomnia, unspecified: Secondary | ICD-10-CM | POA: Diagnosis not present

## 2019-05-09 DIAGNOSIS — I10 Essential (primary) hypertension: Secondary | ICD-10-CM | POA: Diagnosis not present

## 2019-05-09 DIAGNOSIS — E7800 Pure hypercholesterolemia, unspecified: Secondary | ICD-10-CM | POA: Diagnosis not present

## 2019-05-09 DIAGNOSIS — F324 Major depressive disorder, single episode, in partial remission: Secondary | ICD-10-CM | POA: Diagnosis not present

## 2019-05-09 DIAGNOSIS — D509 Iron deficiency anemia, unspecified: Secondary | ICD-10-CM | POA: Diagnosis not present

## 2019-05-24 ENCOUNTER — Telehealth: Payer: Self-pay | Admitting: Neurology

## 2019-05-24 NOTE — Telephone Encounter (Signed)
Pt called requesting a CB from RN to discuss MS

## 2019-05-25 NOTE — Telephone Encounter (Signed)
Called the patient back. Call went straight to voicemail but mailbox was full and was unable to leave a message. Will try back again.

## 2019-05-25 NOTE — Telephone Encounter (Signed)
Called the patient again. Pt is inquiring about some concerns that she has with her MS. Her son has a friend of a MD that specializes in MS patients. She is asking how the process would look and was worried if it would upset the MD. I advised that Dr Vickey Huger would not be upset if the patient wanted to have a 2nd opinion or second set of eyes on her records and care with MS. I advised Dr Vickey Huger would not want the 2nd opinion MD to be treating the patient and her treating the patient too. We would rather their be one attending/treating MS physician.  Informed the patient that her PCP would need to make the referral for her to the MD she wants the 2nd opinion from. Advised that their office will contact our office typically for medical records and that she would have to sign a medical release form for [place] to be able to release to that practice records. She states they would want disk of the MRI's that have been completed. Advised the patient that [facility] is where she would need to get that. Pt was very appreciative for the information and wanted to make it clear this was only for a 2nd opinion at this time not a transition of care. Informed her I would note this and we will wait until records are needed for the patient.

## 2019-06-02 DIAGNOSIS — M1990 Unspecified osteoarthritis, unspecified site: Secondary | ICD-10-CM | POA: Diagnosis not present

## 2019-06-02 DIAGNOSIS — I10 Essential (primary) hypertension: Secondary | ICD-10-CM | POA: Diagnosis not present

## 2019-06-27 ENCOUNTER — Telehealth: Payer: Self-pay | Admitting: Adult Health

## 2019-06-27 ENCOUNTER — Telehealth: Payer: Self-pay | Admitting: Neurology

## 2019-06-27 DIAGNOSIS — Z6828 Body mass index (BMI) 28.0-28.9, adult: Secondary | ICD-10-CM | POA: Diagnosis not present

## 2019-06-27 DIAGNOSIS — M25473 Effusion, unspecified ankle: Secondary | ICD-10-CM | POA: Diagnosis not present

## 2019-06-27 DIAGNOSIS — M7989 Other specified soft tissue disorders: Secondary | ICD-10-CM | POA: Diagnosis not present

## 2019-06-27 DIAGNOSIS — E663 Overweight: Secondary | ICD-10-CM | POA: Diagnosis not present

## 2019-06-27 NOTE — Telephone Encounter (Signed)
Patient called she wanted to know if she could have her recorders faxed to Dr. Gaynell Face office she is going to a different MS doctor. The fax # is [phone].

## 2019-06-27 NOTE — Telephone Encounter (Signed)
Patient called Summer Holloway requesting records.   I am helping with VM only.

## 2019-06-30 NOTE — Telephone Encounter (Signed)
Request done today. 

## 2019-06-30 NOTE — Telephone Encounter (Addendum)
Received call from patient checking on status of records request. She has appointment with Dr Gaynell Face, [facility], fax [phone] for second opinion only. She's not transferring her care. She requested only the past year's records be sent. I advised will send message to [facility]. Patient verbalized understanding, appreciation.

## 2019-07-04 DIAGNOSIS — G35 Multiple sclerosis: Secondary | ICD-10-CM | POA: Diagnosis not present

## 2019-07-08 DIAGNOSIS — E039 Hypothyroidism, unspecified: Secondary | ICD-10-CM | POA: Diagnosis not present

## 2019-07-08 DIAGNOSIS — E7800 Pure hypercholesterolemia, unspecified: Secondary | ICD-10-CM | POA: Diagnosis not present

## 2019-07-08 DIAGNOSIS — G4700 Insomnia, unspecified: Secondary | ICD-10-CM | POA: Diagnosis not present

## 2019-07-08 DIAGNOSIS — D509 Iron deficiency anemia, unspecified: Secondary | ICD-10-CM | POA: Diagnosis not present

## 2019-07-08 DIAGNOSIS — F322 Major depressive disorder, single episode, severe without psychotic features: Secondary | ICD-10-CM | POA: Diagnosis not present

## 2019-07-08 DIAGNOSIS — F324 Major depressive disorder, single episode, in partial remission: Secondary | ICD-10-CM | POA: Diagnosis not present

## 2019-07-08 DIAGNOSIS — M1990 Unspecified osteoarthritis, unspecified site: Secondary | ICD-10-CM | POA: Diagnosis not present

## 2019-07-08 DIAGNOSIS — F329 Major depressive disorder, single episode, unspecified: Secondary | ICD-10-CM | POA: Diagnosis not present

## 2019-07-08 DIAGNOSIS — I10 Essential (primary) hypertension: Secondary | ICD-10-CM | POA: Diagnosis not present

## 2019-08-02 DIAGNOSIS — G35 Multiple sclerosis: Secondary | ICD-10-CM | POA: Diagnosis not present

## 2019-08-08 NOTE — Telephone Encounter (Signed)
I can see her but I am so booked that it may take 6-8 weeks to get in

## 2019-08-08 NOTE — Telephone Encounter (Signed)
Pt called would like to be transferred to another MS Physician (Dr. Epimenio Foot). Got second opinion form [facility]. [facility] inform patient has progressive MS. Pt would like nurse to call her at [phone].

## 2019-08-08 NOTE — Telephone Encounter (Signed)
I called pt to let her know that received message.  She is asking to see MS specialist Dr. Epimenio Foot.  I relayed that messages sent to both MD's, both have to approve. She has appt with AL/NP on 08-15-19 and will keep this,  (I relayed that AL/NP works with Dr. Epimenio Foot as his NP).

## 2019-08-09 DIAGNOSIS — E663 Overweight: Secondary | ICD-10-CM | POA: Diagnosis not present

## 2019-08-09 DIAGNOSIS — M65331 Trigger finger, right middle finger: Secondary | ICD-10-CM | POA: Diagnosis not present

## 2019-08-09 DIAGNOSIS — M7989 Other specified soft tissue disorders: Secondary | ICD-10-CM | POA: Diagnosis not present

## 2019-08-09 DIAGNOSIS — Z6828 Body mass index (BMI) 28.0-28.9, adult: Secondary | ICD-10-CM | POA: Diagnosis not present

## 2019-08-09 DIAGNOSIS — M25473 Effusion, unspecified ankle: Secondary | ICD-10-CM | POA: Diagnosis not present

## 2019-08-09 NOTE — Telephone Encounter (Signed)
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 I am fully OK with her switching to a MS specialist.   CD

## 2019-08-09 NOTE — Telephone Encounter (Signed)
Left message on pt's VM req a call bk to schedule

## 2019-08-15 ENCOUNTER — Encounter: Payer: Self-pay | Admitting: Family Medicine

## 2019-08-15 ENCOUNTER — Ambulatory Visit: Payer: Medicare PPO | Admitting: Family Medicine

## 2019-08-15 VITALS — BP 140/71 | HR 73 | Ht 67.0 in | Wt 183.0 lb

## 2019-08-15 DIAGNOSIS — Z8669 Personal history of other diseases of the nervous system and sense organs: Secondary | ICD-10-CM | POA: Diagnosis not present

## 2019-08-15 DIAGNOSIS — G35 Multiple sclerosis: Secondary | ICD-10-CM | POA: Diagnosis not present

## 2019-08-15 DIAGNOSIS — F32A Depression, unspecified: Secondary | ICD-10-CM

## 2019-08-15 DIAGNOSIS — G4700 Insomnia, unspecified: Secondary | ICD-10-CM

## 2019-08-15 DIAGNOSIS — R270 Ataxia, unspecified: Secondary | ICD-10-CM

## 2019-08-15 DIAGNOSIS — F329 Major depressive disorder, single episode, unspecified: Secondary | ICD-10-CM | POA: Diagnosis not present

## 2019-08-15 DIAGNOSIS — M797 Fibromyalgia: Secondary | ICD-10-CM

## 2019-08-15 DIAGNOSIS — R296 Repeated falls: Secondary | ICD-10-CM

## 2019-08-15 NOTE — Progress Notes (Signed)
PATIENT: Summer Holloway DOB: 31-Oct-1950  REASON FOR VISIT: follow up HISTORY FROM: patient  Chief Complaint  Patient presents with  . Follow-up    Rm 1 here for a f/u on MS. Pt saysher hair has been coming out     HISTORY OF PRESENT ILLNESS: Today 08/15/19 Summer Holloway is a 69 y.o. 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 She was seen for a second opinion by Dr Gaynell Face with [facility] (06/2019). She has continued Ocrevus infusions, last infusion 3 weeks ago. MRI brain, cervical and thoracic stable in 11/2018.   She feels ataxia is still a concern. It seems to have worsened slowly. She feels that she loses her balance easily. She does not drive any longer. She was started on amantadine 100mg  BID by Dr . She feels that this has helped significantly with fatigue but only takes one dose daily. She was referred to PT but does not feel she is able to find time right now. She is caring for her two grand daughters (ages 1 and 56yrs). She reports multiple falls since last being seen. Last fall was 2 weeks ago. She denies injuries. She is not using any assistive devices. She does not feel she needs these at this time.  She continues close follow up with PCP for insomnia, fibromyalgia and depression. She is taking amitriptyline, Wellbutrin, Cymbalta, Lyrica and Robaxin as listed.   She continues to have double vision, followed closely by her ophthalmologists.   No new numbness/weakness, bowel or bladder changes.    HISTORY: (copied from Dr Dohmeier's note on 02/16/2019)  Summer  Summer Holloway is a 69 y.o. female , seen here as a referral/ revisit  from Dr. 78 for evaluation of dysequilibrium.  I see her today on 16 February 2019 and the patient reports that her ataxia has progressed over the last 18 months, she has seen the nurse practitioners my last visit with her was in September 2018.  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  When the patient first was seen here she already reported that a ataxia-balance problem existed for over 5 years prior I would find now assume that her MS has been present for at least 10 years. After her last visit with nurse practitioner Leahann Lempke nurse practitioner ordered an MRI of the cervical thoracic and lumbar spine.  We also performed a Montreal cognitive assessment today which she passed with flying colors a 28 out of 30 points. She did not make an attempt to solve the trail making test.  She has been on OCREVUS IV therapy.  Brain MRI Nov 2020 - no new lesions.   Cervical spine MRI with and without contrast was performed on December 04, 2018 also was by Mael Delap spinal cord appears normal vertebral bodies are normally aligned some degenerative changes and disc height reduction but no enhancement after contrast which means that there is no involvement of the spinal cord.  The thoracic spine MRI cord was normal in size and appearance mild disc bulging as expected for age between T7 and 8 at T8 and 9.  No abnormal enhancement . The  MRI lumbar spine with and without contrast facet hypertrophy right paramedian disc protrusion at T12 but actually seems smaller than the previous MRI there is mild to moderate spinal stenosis at L3-4 moderate spinal stenosis  at L4-5 but no nerve root compression and again no normal abnormal enhancement pattern of the spinal cord.  No MS lesions were found in the spinal cord.  The patient was asked to close follow-up with Dr. Ethelene Hal for back and hip pain and to let us know if she has any new symptoms.  She continues to see Dr Ethelene Hal, and I am unsure if we actually treat MS given the non-progression of presumed demyelinating lesions. She just finished mammogram and dental appointments.   Montreal Cognitive Assessment Blind 02/16/2019  Attention: Read list of digits (0/2)  2  Attention: Read list of letters (0/1) 1  Attention: Serial 7 subtraction starting at 100 (0/3) 3  Language: Repeat phrase (0/2) 1  Language : Fluency (0/1) 1  Abstraction (0/2) 2  Delayed Recall (0/5) 5  Orientation (0/6) 6  Total 21     No flowsheet data found.     01-21-2016 Summer Holloway is seen here today in the presence of her husband and was referred her primary care physician Dr. Kevan Ny for evaluation of disequilibrium, unsteady gait, possible ataxia. She carries a diagnosis of weight gain, hypertension, she also has been followed by Dr. Kevan Ny for fibromyalgia, insomnia and has been treated for depression. She has been taking Lyrica and amitriptyline. The chronic disequilibrium begun according to Dr. Kevan Ny notes about  5 years ago " unsteady  " while  the patient walks or is in motion. Sometimes she has have to hold onto her husband's arm to steady her gait. She feels as it is being pulled into a different direction, cannot walk a straight line. She jokingly reports having no chance to pass a drunk driving test. She is only experiencing this pulling when in motion and not at rest. She does not describe vertigo. When anxious she is much more off balance!  She is pleasant and conversant, reports that she is afraid of falling. She has not been evaluated by ENT. She does have hearing loss and a chronic tinnitus  ( a constant high pitches sound ) for 35 years.  She had no MRI of the brain and has no family history of schwannoma.Maternal aunt clarabelle had ataxia, vertigo and auditory hallucinations. Mother had anxiety.  Social history:  Married, non smoker, non ETOH, use, caffeine , none. Retired, Comptroller.   Interval history from 03/05/2016, I have pleasure of seeing Mr. and Mrs. Girardin today to meet them and discuss the results of her recent MRI which was abnormal and her spinal tap results, cerebral spinal fluid have been sent for oligoclonal bands, cells and differential, glucose  and protein.  Patient seen and a routine revisit today is 05/27/2016. Mrs. Sekula has tolerated Tecfidera very well - improved tremor and less balance problems subjectively. Today CBC and CMET , next visit with NP after another 6 -8 weeks.     REVIEW OF SYSTEMS: Out of a complete 14 system review of symptoms, the patient complains only of the following symptoms, and all other reviewed systems are negative.  ALLERGIES: Allergies  Allergen Reactions  . Pizza Flavor Nausea And Vomiting    HOME MEDICATIONS: Outpatient Medications Prior to Visit  Medication Sig Dispense Refill  . AMANTADINE HCL PO Take by mouth.    Marland Kitchen amitriptyline (ELAVIL) 50 MG tablet Take 25 mg by mouth at bedtime.    Marland Kitchen amLODipine (NORVASC) 5 MG tablet Take 5 mg by mouth daily. Per patient she takes on Mon, Wed, Friday    . buPROPion (WELLBUTRIN XL)  150 MG 24 hr tablet Take 150 mg by mouth daily.    . DULoxetine (CYMBALTA) 60 MG capsule Take 60 mg by mouth daily.    Marland Kitchen levothyroxine (SYNTHROID, LEVOTHROID) 50 MCG tablet Take 50 mcg by mouth daily before breakfast.    . methocarbamol (ROBAXIN) 500 MG tablet Take 1 tablet (500 mg total) by mouth every 6 (six) hours as needed for muscle spasms. 20 tablet 1  . ocrelizumab (OCREVUS) 300 MG/10ML injection Inject 600 mg into the vein every 6 (six) months.    . pantoprazole (PROTONIX) 40 MG tablet Take 40 mg by mouth daily.    . pregabalin (LYRICA) 100 MG capsule Take 100 mg by mouth 2 (two) times daily.     . propranolol (INDERAL) 10 MG tablet Take 1 tablet by mouth twice daily. (Patient taking differently: Take 10 mg by mouth 2 (two) times daily. ) 60 tablet 11  . rosuvastatin (CRESTOR) 5 MG tablet Take 5 mg by mouth every Monday, Wednesday, and Friday.     . traMADol (ULTRAM) 50 MG tablet Take 1 tablet (50 mg total) by mouth every 6 (six) hours as needed. For pain not relieved by tylenol or ibuprofen 20 tablet 0  . amphetamine-dextroamphetamine (ADDERALL) 10 MG tablet Take 5 mg  by mouth daily as needed (enegry).     No facility-administered medications prior to visit.    PAST MEDICAL HISTORY: Past Medical History:  Diagnosis Date  . Anxiety   . Bursitis of left hip   . Depression   . Dysrhythmia    diagnosed pvc 1980  . Esophageal reflux   . Fibromyalgia   . Hearing loss   . Hyperlipemia   . Hyperlipidemia   . Hypertension   . Hypothyroidism   . Multiple sclerosis (HCC)   . Osteoarthritis   . PVC (premature ventricular contraction)   . Sciatica   . Tinnitus   . Uterine prolapse     PAST SURGICAL HISTORY: Past Surgical History:  Procedure Laterality Date  . BREAST BIOPSY  05/24/2002  . BREAST BIOPSY Left 05/30/2016   negative  . CATARACT EXTRACTION, BILATERAL    . CHOLECYSTECTOMY N/A 03/24/2019   Procedure: LAPAROSCOPIC CHOLECYSTECTOMY;  Surgeon: Berna Bue, MD;  Location: [facility];  Service: General;  Laterality: N/A;  . PARTIAL HYSTERECTOMY    . surgical mesh     of uterine  . vaginal hemorrhoid surgery       FAMILY HISTORY: Family History  Problem Relation Age of Onset  . Fibromyalgia Mother   . Anxiety disorder Mother   . Heart attack Father 28  . CAD Father   . Heart attack Cousin     SOCIAL HISTORY: Social History   Socioeconomic History  . Marital status: Married    Spouse name: Deniece Portela  . Number of children: 2  . Years of education: Not on file  . Highest education level: Associate degree: occupational, Scientist, product/process development, or vocational program  Occupational History  . Not on file  Tobacco Use  . Smoking status: Never Smoker  . Smokeless tobacco: Never Used  Vaping Use  . Vaping Use: Never used  Substance and Sexual Activity  . Alcohol use: No  . Drug use: Never  . Sexual activity: Not on file  Other Topics Concern  . Not on file  Social History Narrative   Lives with husband   Social Determinants of Health   Financial Resource Strain:   . Difficulty of Paying Living Expenses:   Food Insecurity:   .  Worried  About Programme researcher, broadcasting/film/video in the Last Year:   . Barista in the Last Year:   Transportation Needs:   . Freight forwarder (Medical):   Marland Kitchen Lack of Transportation (Non-Medical):   Physical Activity:   . Days of Exercise per Week:   . Minutes of Exercise per Session:   Stress:   . Feeling of Stress :   Social Connections:   . Frequency of Communication with Friends and Family:   . Frequency of Social Gatherings with Friends and Family:   . Attends Religious Services:   . Active Member of Clubs or Organizations:   . Attends Banker Meetings:   Marland Kitchen Marital Status:   Intimate Partner Violence:   . Fear of Current or Ex-Partner:   . Emotionally Abused:   Marland Kitchen Physically Abused:   . Sexually Abused:       PHYSICAL EXAM  Vitals:   08/15/19 1246  BP: 140/71  Pulse: 73  Weight: 183 lb (83 kg)  Height: 5\' 7"  (1.702 m)   Body mass index is 28.66 kg/m.  Generalized: Well developed, in no acute distress  Cardiology: normal rate and rhythm, no murmur noted Respiratory: clear to auscultation bilaterally  Neurological examination  Mentation: Alert oriented to time, place, history taking. Follows all commands speech and language fluent Cranial nerve II-XII: Pupils were equal round reactive to light. Extraocular movements were full, visual field were full on confrontational test. Facial sensation and strength were normal. Uvula tongue midline. Head turning and shoulder shrug  were normal and symmetric. Motor: The motor testing reveals 5 over 5 strength of all 4 extremities. Good symmetric motor tone is noted throughout.  Sensory: Sensory testing is intact to soft touch on all 4 extremities. No evidence of extinction is noted.  Coordination: Cerebellar testing reveals good finger-nose-finger and heel-to-shin bilaterally.  Gait and station: Gait is normal. Tandem gait is normal. Romberg is negative. No drift is seen.  Reflexes: Deep tendon reflexes are symmetric and normal  bilaterally.   DIAGNOSTIC DATA (LABS, IMAGING, TESTING) - I reviewed patient records, labs, notes, testing and imaging myself where available.  No flowsheet data found.   Lab Results  Component Value Date   WBC 17.6 (H) 03/25/2019   HGB 10.8 (L) 03/25/2019   HCT 35.2 (L) 03/25/2019   MCV 91.2 03/25/2019   PLT 553 (H) 03/25/2019      Component Value Date/Time   NA 140 03/25/2019 0414   NA 143 02/16/2019 1126   K 4.6 03/25/2019 0414   CL 103 03/25/2019 0414   CO2 26 03/25/2019 0414   GLUCOSE 121 (H) 03/25/2019 0414   BUN 10 03/25/2019 0414   BUN 16 02/16/2019 1126   CREATININE 0.78 03/25/2019 0414   CALCIUM 8.6 (L) 03/25/2019 0414   PROT 6.3 (L) 03/25/2019 0414   PROT 6.6 02/16/2019 1126   ALBUMIN 2.8 (L) 03/25/2019 0414   ALBUMIN 4.2 02/16/2019 1126   AST 125 (H) 03/25/2019 0414   ALT 62 (H) 03/25/2019 0414   ALKPHOS 601 (H) 03/25/2019 0414   BILITOT 0.2 (L) 03/25/2019 0414   BILITOT <0.2 02/16/2019 1126   GFRNONAA >60 03/25/2019 0414   GFRAA >60 03/25/2019 0414   No results found for: CHOL, HDL, LDLCALC, LDLDIRECT, TRIG, CHOLHDL No results found for: WKGS8P Lab Results  Component Value Date   VITAMINB12 440 10/25/2018   No results found for: TSH   ASSESSMENT AND PLAN 69 y.o. year old female  has a past medical history of Anxiety, Bursitis of left hip, Depression, Dysrhythmia, Esophageal reflux, Fibromyalgia, Hearing loss, Hyperlipemia, Hyperlipidemia, Hypertension, Hypothyroidism, Multiple sclerosis (HCC), Osteoarthritis, PVC (premature ventricular contraction), Sciatica, Tinnitus, and Uterine prolapse. here with     ICD-10-CM   1. Chronic progressive multiple sclerosis (HCC)  G35   2. Ataxia  R27.0   3. Multiple falls  R29.6   4. History of diplopia  Z86.69   5. Fibromyalgia  M79.7   6. Depression, unspecified depression type  F32.9   7. Insomnia, unspecified type  G47.00     Overall, Summer Holloway is doing fairly well. She continues to tolerate Ocrevus  infusions. She sought a second opinion with [facility] who started amantadine which seems to have provided some benefit with improved energy. She continues to worry about ataxia and imbalance. She was encouraged to consider PT/OT as recommended by [facility]. She refuses at this time. She will continue amantadine 100mg  daily. I have encouraged her to stay well hydrated. Focus on healthy lifestyle habits. Fall precautions reviewed. Cane or walking stick may be helpful. She has requested to see Dr Epimenio Foot and has an appt scheduled 10/19/2019. She verbalizes understanding and agreement with this plan.    No orders of the defined types were placed in this encounter.    No orders of the defined types were placed in this encounter.     I spent 30 minutes with the patient. 50% of this time was spent counseling and educating patient on plan of care and medications.    Shawnie Dapper, FNP-C 08/15/2019, 2:23 PM [facility] [address] [place], [place] [address] [phone]

## 2019-08-15 NOTE — Progress Notes (Signed)
I have read the note, and I agree with the clinical assessment and plan.  Emmert Roethler A. Australia Droll, MD, PhD, FAAN Certified in Neurology, Clinical Neurophysiology, Sleep Medicine, Pain Medicine and Neuroimaging  [facility] [address], [address] [place],  [address] [phone]  

## 2019-08-15 NOTE — Patient Instructions (Signed)
We will continue current treatment plan. Return to see Dr Epimenio Foot in October. We will update plan of care at that time if recommended.   Consider PT/OT referral to Neuro Rehab.   Stay well hydrated, focus on a healthy diet and regular exercise. Please be cautious with position changes. Consider a cane or walking stick for assistance with stability.     Fall Prevention in the Home, Adult Falls can cause injuries. They can happen to people of all ages. There are many things you can do to make your home safe and to help prevent falls. Ask for help when making these changes, if needed. What actions can I take to prevent falls? General Instructions  Use good lighting in all rooms. Replace any light bulbs that burn out.  Turn on the lights when you go into a dark area. Use night-lights.  Keep items that you use often in easy-to-reach places. Lower the shelves around your home if necessary.  Set up your furniture so you have a clear path. Avoid moving your furniture around.  Do not have throw rugs and other things on the floor that can make you trip.  Avoid walking on wet floors.  If any of your floors are uneven, fix them.  Add color or contrast paint or tape to clearly mark and help you see: ? Any grab bars or handrails. ? First and last steps of stairways. ? Where the edge of each step is.  If you use a stepladder: ? Make sure that it is fully opened. Do not climb a closed stepladder. ? Make sure that both sides of the stepladder are locked into place. ? Ask someone to hold the stepladder for you while you use it.  If there are any pets around you, be aware of where they are. What can I do in the bathroom?      Keep the floor dry. Clean up any water that spills onto the floor as soon as it happens.  Remove soap buildup in the tub or shower regularly.  Use non-skid mats or decals on the floor of the tub or shower.  Attach bath mats securely with double-sided, non-slip  rug tape.  If you need to sit down in the shower, use a plastic, non-slip stool.  Install grab bars by the toilet and in the tub and shower. Do not use towel bars as grab bars. What can I do in the bedroom?  Make sure that you have a light by your bed that is easy to reach.  Do not use any sheets or blankets that are too big for your bed. They should not hang down onto the floor.  Have a firm chair that has side arms. You can use this for support while you get dressed. What can I do in the kitchen?  Clean up any spills right away.  If you need to reach something above you, use a strong step stool that has a grab bar.  Keep electrical cords out of the way.  Do not use floor polish or wax that makes floors slippery. If you must use wax, use non-skid floor wax. What can I do with my stairs?  Do not leave any items on the stairs.  Make sure that you have a light switch at the top of the stairs and the bottom of the stairs. If you do not have them, ask someone to add them for you.  Make sure that there are handrails on both sides of  the stairs, and use them. Fix handrails that are broken or loose. Make sure that handrails are as long as the stairways.  Install non-slip stair treads on all stairs in your home.  Avoid having throw rugs at the top or bottom of the stairs. If you do have throw rugs, attach them to the floor with carpet tape.  Choose a carpet that does not hide the edge of the steps on the stairway.  Check any carpeting to make sure that it is firmly attached to the stairs. Fix any carpet that is loose or worn. What can I do on the outside of my home?  Use bright outdoor lighting.  Regularly fix the edges of walkways and driveways and fix any cracks.  Remove anything that might make you trip as you walk through a door, such as a raised step or threshold.  Trim any bushes or trees on the path to your home.  Regularly check to see if handrails are loose or broken.  Make sure that both sides of any steps have handrails.  Install guardrails along the edges of any raised decks and porches.  Clear walking paths of anything that might make someone trip, such as tools or rocks.  Have any leaves, snow, or ice cleared regularly.  Use sand or salt on walking paths during winter.  Clean up any spills in your garage right away. This includes grease or oil spills. What other actions can I take?  Wear shoes that: ? Have a low heel. Do not wear high heels. ? Have rubber bottoms. ? Are comfortable and fit you well. ? Are closed at the toe. Do not wear open-toe sandals.  Use tools that help you move around (mobility aids) if they are needed. These include: ? Canes. ? Walkers. ? Scooters. ? Crutches.  Review your medicines with your doctor. Some medicines can make you feel dizzy. This can increase your chance of falling. Ask your doctor what other things you can do to help prevent falls. Where to find more information  Centers for Disease Control and Prevention, [employer]: HealthcareCounselor.com.pt  [employer] on Aging: RingConnections.si Contact a doctor if:  You are afraid of falling at home.  You feel weak, drowsy, or dizzy at home.  You fall at home. Summary  There are many simple things that you can do to make your home safe and to help prevent falls.  Ways to make your home safe include removing tripping hazards and installing grab bars in the bathroom.  Ask for help when making these changes in your home. This information is not intended to replace advice given to you by your health care provider. Make sure you discuss any questions you have with your health care provider. Document Revised: 04/22/2018 Document Reviewed: 08/14/2016 Elsevier Patient Education  2020 [employer]   Multiple Sclerosis Multiple sclerosis (MS) is a disease of the brain, spinal cord, and optic nerves (central nervous system). It causes the body's  disease-fighting (immune) system to destroy the protective covering (myelin sheath) around nerves in the brain. When this happens, signals (nerve impulses) going to and from the brain and spinal cord do not get sent properly or may not get sent at all. There are several types of MS:  Relapsing-remitting MS. This is the most common type. This causes sudden attacks of symptoms. After an attack, you may recover completely until the next attack, or some symptoms may remain permanently.  Secondary progressive MS. This usually develops after the onset of  relapsing-remitting MS. Similar to relapsing-remitting MS, this type also causes sudden attacks of symptoms. Attacks may be less frequent, but symptoms slowly get worse (progress) over time.  Primary progressive MS. This causes symptoms that steadily progress over time. This type of MS does not cause sudden attacks of symptoms. The age of onset of MS varies, but it often develops between 79-9 years of age. MS is a lifelong (chronic) condition. There is no cure, but treatment can help slow down the progression of the disease. What are the causes? The cause of this condition is not known. What increases the risk? You are more likely to develop this condition if:  You are a woman.  You have a relative with MS. However, the condition is not passed from parent to child (inherited).  You have a lack (deficiency) of vitamin D.  You smoke. MS is more common in the [place] than in the [place]. What are the signs or symptoms? Relapsing-remitting and secondary progressive MS cause symptoms to occur in episodes or attacks that may last weeks to months. There may be long periods between attacks in which there are almost no symptoms. Primary progressive MS causes symptoms to steadily progress after they develop. Symptoms of MS vary because of the many different ways it affects the central nervous system. The main symptoms  include:  Vision problems and eye pain.  Numbness.  Weakness.  Inability to move your arms, hands, feet, or legs (paralysis).  Balance problems.  Shaking that you cannot control (tremors).  Muscle spasms.  Problems with thinking (cognitive changes). MS can also cause symptoms that are associated with the disease, but are not always the direct result of an MS attack. They may include:  Inability to control urination or bowel movements (incontinence).  Headaches.  Fatigue.  Inability to tolerate heat.  Emotional changes.  Depression.  Pain. How is this diagnosed? This condition is diagnosed based on:  Your symptoms.  A neurological exam. This involves checking central nervous system function, such as nerve function, reflexes, and coordination.  MRIs of the brain and spinal cord.  Lab tests, including a lumbar puncture that tests the fluid that surrounds the brain and spinal cord (cerebrospinal fluid).  Tests to measure the electrical activity of the brain in response to stimulation (evoked potentials). How is this treated? There is no cure for MS, but medicines can help decrease the number and frequency of attacks and help relieve nuisance symptoms. Treatment options may include:  Medicines that reduce the frequency of attacks. These medicines may be given by injection, by mouth (orally), or through an IV.  Medicines that reduce inflammation (steroids). These may provide short-term relief of symptoms.  Medicines to help control pain, depression, fatigue, or incontinence.  Vitamin D, if you have a deficiency.  Using devices to help you move around (assistive devices), such as braces, a cane, or a walker.  Physical therapy to strengthen and stretch your muscles.  Occupational therapy to help you with everyday tasks.  Alternative or complementary treatments such as exercise, massage, or acupuncture. Follow these instructions at home:  Take over-the-counter  and prescription medicines only as told by your health care provider.  Do not drive or use heavy machinery while taking prescription pain medicine.  Use assistive devices as recommended by your physical therapist or your health care provider.  Exercise as directed by your health care provider.  Return to your normal activities as told by your health care provider. Ask your  health care provider what activities are safe for you.  Reach out for support. Share your feelings with friends, family, or a support group.  Keep all follow-up visits as told by your health care provider and therapists. This is important. Where to find more information  National Multiple Sclerosis Society: https://www.nationalmssociety.org Contact a health care provider if:  You feel depressed.  You develop new pain or numbness.  You have tremors.  You have problems with sexual function. Get help right away if:  You develop paralysis.  You develop numbness.  You have problems with your bladder or bowel function.  You develop double vision.  You lose vision in one or both eyes.  You develop suicidal thoughts.  You develop severe confusion. If you ever feel like you may hurt yourself or others, or have thoughts about taking your own life, get help right away. You can go to your nearest emergency department or call:  Your local emergency services (911 in the [place]  A suicide crisis helpline, such as the National Suicide Prevention Lifeline at 276-314-0809. This is open 24 hours a day. Summary  Multiple sclerosis (MS) is a disease of the central nervous system that causes the body's immune system to destroy the protective covering (myelin sheath) around nerves in the brain.  There are 3 types of MS: relapsing-remitting, secondary progressive, and primary progressive. Relapsing-remitting and secondary progressive MS cause symptoms to occur in episodes or attacks that may last weeks to months. Primary  progressive MS causes symptoms to steadily progress after they develop.  There is no cure for MS, but medicines can help decrease the number and frequency of attacks and help relieve nuisance symptoms. Treatment may also include physical or occupational therapy.  If you develop numbness, paralysis, vision problems, or other neurological symptoms, get help right away. This information is not intended to replace advice given to you by your health care provider. Make sure you discuss any questions you have with your health care provider. Document Revised: 12/12/2016 Document Reviewed: 03/10/2016 Elsevier Patient Education  2020 [employer].

## 2019-09-06 ENCOUNTER — Ambulatory Visit
Admission: RE | Admit: 2019-09-06 | Discharge: 2019-09-06 | Disposition: A | Discharge status: Home / Self Care | Payer: Medicare PPO | Source: Ambulatory Visit | Attending: Internal Medicine | Admitting: Internal Medicine

## 2019-09-06 ENCOUNTER — Other Ambulatory Visit: Payer: Self-pay

## 2019-09-06 DIAGNOSIS — R921 Mammographic calcification found on diagnostic imaging of breast: Secondary | ICD-10-CM | POA: Diagnosis not present

## 2019-09-06 DIAGNOSIS — E039 Hypothyroidism, unspecified: Secondary | ICD-10-CM | POA: Diagnosis not present

## 2019-09-06 DIAGNOSIS — E559 Vitamin D deficiency, unspecified: Secondary | ICD-10-CM | POA: Diagnosis not present

## 2019-09-06 DIAGNOSIS — Z0001 Encounter for general adult medical examination with abnormal findings: Secondary | ICD-10-CM | POA: Diagnosis not present

## 2019-09-06 DIAGNOSIS — G35 Multiple sclerosis: Secondary | ICD-10-CM | POA: Diagnosis not present

## 2019-09-06 DIAGNOSIS — R202 Paresthesia of skin: Secondary | ICD-10-CM | POA: Diagnosis not present

## 2019-09-06 DIAGNOSIS — F324 Major depressive disorder, single episode, in partial remission: Secondary | ICD-10-CM | POA: Diagnosis not present

## 2019-09-06 DIAGNOSIS — E7800 Pure hypercholesterolemia, unspecified: Secondary | ICD-10-CM | POA: Diagnosis not present

## 2019-09-06 DIAGNOSIS — K219 Gastro-esophageal reflux disease without esophagitis: Secondary | ICD-10-CM | POA: Diagnosis not present

## 2019-09-06 DIAGNOSIS — I10 Essential (primary) hypertension: Secondary | ICD-10-CM | POA: Diagnosis not present

## 2019-09-11 ENCOUNTER — Other Ambulatory Visit: Payer: Self-pay | Admitting: Physician Assistant

## 2019-09-13 MED ORDER — PROPRANOLOL HCL 10 MG PO TABS: 10.0000 mg | ORAL_TABLET | Freq: Two times a day (BID) | ORAL | 1 refills | Status: DC

## 2019-10-14 DIAGNOSIS — Z1159 Encounter for screening for other viral diseases: Secondary | ICD-10-CM | POA: Diagnosis not present

## 2019-10-17 DIAGNOSIS — K5730 Diverticulosis of large intestine without perforation or abscess without bleeding: Secondary | ICD-10-CM | POA: Diagnosis not present

## 2019-10-17 DIAGNOSIS — Z1211 Encounter for screening for malignant neoplasm of colon: Secondary | ICD-10-CM | POA: Diagnosis not present

## 2019-10-19 ENCOUNTER — Ambulatory Visit: Payer: Medicare PPO | Admitting: Neurology

## 2019-10-19 ENCOUNTER — Telehealth: Payer: Self-pay | Admitting: *Deleted

## 2019-10-19 ENCOUNTER — Encounter: Payer: Self-pay | Admitting: Neurology

## 2019-10-19 VITALS — BP 123/73 | HR 66 | Ht 67.0 in | Wt 182.0 lb

## 2019-10-19 DIAGNOSIS — G35 Multiple sclerosis: Secondary | ICD-10-CM | POA: Diagnosis not present

## 2019-10-19 DIAGNOSIS — R200 Anesthesia of skin: Secondary | ICD-10-CM

## 2019-10-19 DIAGNOSIS — E559 Vitamin D deficiency, unspecified: Secondary | ICD-10-CM | POA: Insufficient documentation

## 2019-10-19 DIAGNOSIS — G4700 Insomnia, unspecified: Secondary | ICD-10-CM | POA: Diagnosis not present

## 2019-10-19 DIAGNOSIS — R5383 Other fatigue: Secondary | ICD-10-CM | POA: Diagnosis not present

## 2019-10-19 DIAGNOSIS — R269 Unspecified abnormalities of gait and mobility: Secondary | ICD-10-CM | POA: Diagnosis not present

## 2019-10-19 DIAGNOSIS — Z79899 Other long term (current) drug therapy: Secondary | ICD-10-CM

## 2019-10-19 DIAGNOSIS — F418 Other specified anxiety disorders: Secondary | ICD-10-CM

## 2019-10-19 MED ORDER — METHOCARBAMOL 500 MG PO TABS: 500.0000 mg | ORAL_TABLET | Freq: Two times a day (BID) | ORAL | 3 refills | Status: AC | PRN

## 2019-10-19 MED ORDER — BUPROPION HCL ER (XL) 300 MG PO TB24: 300.0000 mg | ORAL_TABLET | Freq: Every day | ORAL | 11 refills | Status: DC

## 2019-10-19 MED ORDER — MODAFINIL 200 MG PO TABS: 200.0000 mg | ORAL_TABLET | Freq: Every day | ORAL | 5 refills | Status: DC

## 2019-10-19 NOTE — Progress Notes (Signed)
[facility]  PATIENT: Summer Holloway DOB: [birth year]  REFERRING DOCTOR OR PCP: Dr. Vickey Huger SOURCE: Patient, notes from neurology, imaging and lab reports, imaging studies personally reviewed.  _________________________________   HISTORICAL  CHIEF COMPLAINT:  Chief Complaint  Patient presents with  . New Patient (Initial Visit)    RM 12. Transfer of care from Dr. Vickey Huger for MS. Last saw AL,NP 08/15/19. She refused referral to PT/OT per previous notes. She is concerned about ataxia/imbalance issues.   . Multiple Sclerosis    On Ocrevus.Last: 08/02/19, next: 02/14/20. Receives at [facility] with intrafusion.   . 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 Vickey Huger and Dr. Gaynell Face and would like to transfer care to me.  She was diagnosed with MS in 2017 after presenting with unsteadiness, falls.    She saw Dr. Vickey Huger who checked MRIs and CSF.   She started Tecfidera and switched to Ocrevus in 2019 when she was felt to have PPMS rather than RRMS.     She tolerates it well.   She feels mildly stabilized.   She has had slow progression of neurologic symptoms with no definite exacerbations.    Symptoms may have started 30 years ago.     Initially, she was felt to have fibromyalgia.   She reports weakness in her legs> arms, R>L.  She has some spasticity and used to be on methocarbamol but not recently.     She has right > left numbness. She has dysesthesias, helped by Lyrica.    She still has gait ataxia and frequent falls.    She has urinary urgency with 4 x nocturia.    She is not on any bladder medication.    She has some visual changes on the right and has had diplopia.   She has prism glasses.  She has fatigue daily. She takes amantadine with some benefit but she has more trouble falling asleep.Marland Kitchen   She  wakes up frequently.   She notes depression and anxiety and is on buproprion and duloxetine..    She notes some cognitive changes - mostly processing, word-finding and memory.     She has seen Dr. Gaynell Face at [facility] who confirmed the diagnosis of primary progressive MS..  He started amantadine which has helped some but has made her sleep worse.       She has had the Covid vaccination [employer]) in February and March.     Imaging review: MRI 02/11/2016 of the brain showed T2/FLAIR hyperintense foci in the left posterior pons, right middle cerebellar peduncle and in the periventricular and deep white matter of the hemispheres. This pattern is consistent with chronic demyelination as could be seen with multiple sclerosis but could also be seen with chronic microvascular ischemic changes.     None of the foci appeared to be acute. Further clinical correlation is suggested.  MRI of the brain 12/04/2018 showed multiple T2/flair hyperintense foci in the cerebral hemispheres, brainstem and cerebellum in a pattern and configuration consistent with chronic demyelinating plaque associated with multiple sclerosis.  None of the foci appears to be acute and they do not enhance..   Left maxillary mucocele and chronic inflammatory changes in the ethmoid air cells, frontal recesses and left frontal sinus.    MRI cervical spine 12/04/2018 showed mild to  moderate spinal stenosis at C4-C5 through C6-C7 but the spinal cord appeared normal.  MRI of the thoracic spine 02/01/2019 was normal  REVIEW OF SYSTEMS: Constitutional: No fevers, chills, sweats, or change in appetite Eyes: No visual changes, double vision, eye pain Ear, nose and throat: No hearing loss, ear pain, nasal congestion, sore throat Cardiovascular: No chest pain, palpitations Respiratory: No shortness of breath at rest or with exertion.   No wheezes GastrointestinaI: No nausea, vomiting, diarrhea, abdominal pain, fecal incontinence Genitourinary: No  dysuria, urinary retention or frequency.  No nocturia. Musculoskeletal: No neck pain, back pain Integumentary: No rash, pruritus, skin lesions Neurological: as above Psychiatric: No depression at this time.  No anxiety Endocrine: No palpitations, diaphoresis, change in appetite, change in weigh or increased thirst Hematologic/Lymphatic: No anemia, purpura, petechiae. Allergic/Immunologic: No itchy/runny eyes, nasal congestion, recent allergic reactions, rashes  ALLERGIES: Allergies  Allergen Reactions  . Pizza Flavor Nausea And Vomiting    HOME MEDICATIONS:  Current Outpatient Medications:  .  AMANTADINE HCL PO, Take by mouth., Disp: , Rfl:  .  amitriptyline (ELAVIL) 50 MG tablet, Take 25 mg by mouth at bedtime., Disp: , Rfl:  .  amLODipine (NORVASC) 5 MG tablet, Take 5 mg by mouth daily. Per patient she takes on Mon, Wed, Friday, Disp: , Rfl:  .  buPROPion (WELLBUTRIN XL) 150 MG 24 hr tablet, Take 150 mg by mouth daily., Disp: , Rfl:  .  DULoxetine (CYMBALTA) 60 MG capsule, Take 60 mg by mouth daily., Disp: , Rfl:  .  levothyroxine (SYNTHROID, LEVOTHROID) 50 MCG tablet, Take 50 mcg by mouth daily before breakfast., Disp: , Rfl:  .  methocarbamol (ROBAXIN) 500 MG tablet, Take 1 tablet (500 mg total) by mouth every 6 (six) hours as needed for muscle spasms., Disp: 20 tablet, Rfl: 1 .  ocrelizumab (OCREVUS) 300 MG/10ML injection, Inject 600 mg into the vein every 6 (six) months., Disp: , Rfl:  .  pantoprazole (PROTONIX) 40 MG tablet, Take 40 mg by mouth daily., Disp: , Rfl:  .  pregabalin (LYRICA) 100 MG capsule, Take 100 mg by mouth 2 (two) times daily. , Disp: , Rfl:  .  propranolol (INDERAL) 10 MG tablet, Take 1 tablet (10 mg total) by mouth 2 (two) times daily. Please keep upcoming appt in October for future refills. Thank you, Disp: 60 tablet, Rfl: 1 .  rosuvastatin (CRESTOR) 5 MG tablet, Take 5 mg by mouth every Monday, Wednesday, and Friday. , Disp: , Rfl:  .  traMADol (ULTRAM)  50 MG tablet, Take 1 tablet (50 mg total) by mouth every 6 (six) hours as needed. For pain not relieved by tylenol or ibuprofen, Disp: 20 tablet, Rfl: 0  PAST MEDICAL HISTORY: Past Medical History:  Diagnosis Date  . Anxiety   . Bursitis of left hip   . Depression   . Dysrhythmia    diagnosed pvc 1980  . Esophageal reflux   . Fibromyalgia   . Hearing loss   . Hyperlipemia   . Hyperlipidemia   . Hypertension   . Hypothyroidism   . Multiple sclerosis (HCC)   . Osteoarthritis   . PVC (premature ventricular contraction)   . Sciatica   . Tinnitus   . Uterine prolapse     PAST SURGICAL HISTORY: Past Surgical History:  Procedure Laterality Date  . BREAST BIOPSY  05/24/2002  . BREAST BIOPSY Left 05/30/2016   negative  . CATARACT EXTRACTION, BILATERAL    . CHOLECYSTECTOMY N/A 03/24/2019   Procedure: LAPAROSCOPIC  CHOLECYSTECTOMY;  Surgeon: Berna Bue, MD;  Location: [facility];  Service: General;  Laterality: N/A;  . PARTIAL HYSTERECTOMY    . surgical mesh     of uterine  . vaginal hemorrhoid surgery       FAMILY HISTORY: Family History  Problem Relation Age of Onset  . Fibromyalgia Mother   . Anxiety disorder Mother   . Heart attack Father 20  . CAD Father   . Heart attack Cousin     SOCIAL HISTORY:  Social History   Socioeconomic History  . Marital status: Married    Spouse name: Deniece Portela  . Number of children: 2  . Years of education: Not on file  . Highest education level: Associate degree: occupational, Scientist, product/process development, or vocational program  Occupational History  . Not on file  Tobacco Use  . Smoking status: Never Smoker  . Smokeless tobacco: Never Used  Vaping Use  . Vaping Use: Never used  Substance and Sexual Activity  . Alcohol use: No  . Drug use: Never  . Sexual activity: Not on file  Other Topics Concern  . Not on file  Social History Narrative   Lives with husband   Social Determinants of Health   Financial Resource Strain:   . Difficulty of  Paying Living Expenses: Not on file  Food Insecurity:   . Worried About Programme researcher, broadcasting/film/video in the Last Year: Not on file  . Ran Out of Food in the Last Year: Not on file  Transportation Needs:   . Lack of Transportation (Medical): Not on file  . Lack of Transportation (Non-Medical): Not on file  Physical Activity:   . Days of Exercise per Week: Not on file  . Minutes of Exercise per Session: Not on file  Stress:   . Feeling of Stress : Not on file  Social Connections:   . Frequency of Communication with Friends and Family: Not on file  . Frequency of Social Gatherings with Friends and Family: Not on file  . Attends Religious Services: Not on file  . Active Member of Clubs or Organizations: Not on file  . Attends Banker Meetings: Not on file  . Marital Status: Not on file  Intimate Partner Violence:   . Fear of Current or Ex-Partner: Not on file  . Emotionally Abused: Not on file  . Physically Abused: Not on file  . Sexually Abused: Not on file     PHYSICAL EXAM  Vitals:   10/19/19 1006  BP: 123/73  Pulse: 66  Weight: 182 lb (82.6 kg)  Height: 5\' 7"  (1.702 m)    Body mass index is 28.51 kg/m.   General: The patient is well-developed and well-nourished and in no acute distress  HEENT:  Head is [place]/AT.  Sclera are anicteric.  Funduscopic exam shows normal optic discs and retinal vessels.  Neck: No carotid bruits are noted.  The neck is nontender.  Cardiovascular: The heart has a regular rate and rhythm with a normal S1 and S2. There were no murmurs, gallops or rubs.    Skin: Extremities are without rash or  edema.  Musculoskeletal:  Back is nontender  Neurologic Exam  Mental status: The patient is alert and oriented x 3 at the time of the examination. The patient has apparent normal recent and remote memory, with an apparently normal attention span and concentration ability.   Speech is normal.  Cranial nerves: Extraocular movements show mild  dysconjugate to left gaze.  Pupils are  equal, round, and reactive to light and accomodation. Slight reduced color vision OD.  Facial symmetry is present. There is good facial sensation to soft touch bilaterally.Facial strength is normal.  Trapezius and sternocleidomastoid strength is normal. No dysarthria is noted.  The tongue is midline, and the patient has symmetric elevation of the soft palate. No obvious hearing deficits are noted.  Motor:  Muscle bulk is normal.   Tone is normal. Strength is  5 / 5 in all 4 extremities.   Sensory: Sensory testing is intact to pinprick, soft touch and vibration sensation in arms , slight reduced vibration on right leg.   Coordination: Cerebellar testing reveals good finger-nose-finger and heel-to-shin bilaterally.  Gait and station: Station is normal.   Gait is mildly wide.  Tandem gait is poor. Romberg is negative.   Reflexes: Deep tendon reflexes are symmetric and 3 in arms and knees with spread at knees.   2 at ankles no clonus. .   Plantar responses are flexor.    DIAGNOSTIC DATA (LABS, IMAGING, TESTING) - I reviewed patient records, labs, notes, testing and imaging myself where available.  Lab Results  Component Value Date   WBC 17.6 (H) 03/25/2019   HGB 10.8 (L) 03/25/2019   HCT 35.2 (L) 03/25/2019   MCV 91.2 03/25/2019   PLT 553 (H) 03/25/2019      Component Value Date/Time   NA 140 03/25/2019 0414   NA 143 02/16/2019 1126   K 4.6 03/25/2019 0414   CL 103 03/25/2019 0414   CO2 26 03/25/2019 0414   GLUCOSE 121 (H) 03/25/2019 0414   BUN 10 03/25/2019 0414   BUN 16 02/16/2019 1126   CREATININE 0.78 03/25/2019 0414   CALCIUM 8.6 (L) 03/25/2019 0414   PROT 6.3 (L) 03/25/2019 0414   PROT 6.6 02/16/2019 1126   ALBUMIN 2.8 (L) 03/25/2019 0414   ALBUMIN 4.2 02/16/2019 1126   AST 125 (H) 03/25/2019 0414   ALT 62 (H) 03/25/2019 0414   ALKPHOS 601 (H) 03/25/2019 0414   BILITOT 0.2 (L) 03/25/2019 0414   BILITOT <0.2 02/16/2019 1126    GFRNONAA >60 03/25/2019 0414   GFRAA >60 03/25/2019 0414    Lab Results  Component Value Date   VITAMINB12 440 10/25/2018        ASSESSMENT AND PLAN  Multiple sclerosis (HCC) - Plan: IgG, IgA, IgM, CBC with Differential/Platelet  High risk medication use - Plan: IgG, IgA, IgM, CBC with Differential/Platelet  Gait disturbance  Numbness  Insomnia, unspecified type  Other fatigue  Vitamin D deficiency - Plan: VITAMIN D 25 Hydroxy (Vit-D Deficiency, Fractures)    1.   Continue Ocrevus for now.  We discussed that If she feels benefit is very small, consider d/c as there are risks (pneumonia, breast ca).    2.   Robaxin for intremittent spasms. 3.   Increase Wellbutrin for depression.   Continue other meds. 4.   As she has noted edema, stop amantadine.   Trial of Provigil.   5.   She can take the [employer] vaccination booster. 6.   RTC 6 months  50-minute office visit with the majority of the time spent face-to-face for history and physical, discussion/counseling and decision-making.  Additional time with record review and documentation.   Azelyn Batie A. Epimenio Foot, MD, [facility] 10/19/2019, 10:51 AM Certified in Neurology, Clinical Neurophysiology, Sleep Medicine and Neuroimaging  [facility] [address] [place], [place] [address] [phone]

## 2019-10-19 NOTE — Telephone Encounter (Signed)
Submitted PA modafinil on CMM. Key: [id number]. Waiting on determination from [facility].

## 2019-10-19 NOTE — Telephone Encounter (Signed)
PA Case: [id number], Status: Approved, Coverage Starts on: 01/14/2019 12:00:00 AM, Coverage Ends on: 01/12/2021 12:00:00 AM. Questions? Contact [phone].

## 2019-10-20 LAB — CBC WITH DIFFERENTIAL/PLATELET
Basophils Absolute: 0.1 10*3/uL (ref 0.0–0.2)
Basos: 1 %
EOS (ABSOLUTE): 0.1 10*3/uL (ref 0.0–0.4)
Eos: 2 %
Hematocrit: 32.7 % — ABNORMAL LOW (ref 34.0–46.6)
Hemoglobin: 10.5 g/dL — ABNORMAL LOW (ref 11.1–15.9)
Immature Grans (Abs): 0 10*3/uL (ref 0.0–0.1)
Immature Granulocytes: 0 %
Lymphocytes Absolute: 0.9 10*3/uL (ref 0.7–3.1)
Lymphs: 14 %
MCH: 26 pg — ABNORMAL LOW (ref 26.6–33.0)
MCHC: 32.1 g/dL (ref 31.5–35.7)
MCV: 81 fL (ref 79–97)
Monocytes Absolute: 0.7 10*3/uL (ref 0.1–0.9)
Monocytes: 12 %
Neutrophils Absolute: 4.4 10*3/uL (ref 1.4–7.0)
Neutrophils: 71 %
Platelets: 395 10*3/uL (ref 150–450)
RBC: 4.04 x10E6/uL (ref 3.77–5.28)
RDW: 15.6 % — ABNORMAL HIGH (ref 11.7–15.4)
WBC: 6.2 10*3/uL (ref 3.4–10.8)

## 2019-10-20 LAB — IGG, IGA, IGM
IgA/Immunoglobulin A, Serum: 224 mg/dL (ref 87–352)
IgG (Immunoglobin G), Serum: 656 mg/dL (ref 586–1602)
IgM (Immunoglobulin M), Srm: 85 mg/dL (ref 26–217)

## 2019-10-20 LAB — VITAMIN D 25 HYDROXY (VIT D DEFICIENCY, FRACTURES): Vit D, 25-Hydroxy: 38.8 ng/mL (ref 30.0–100.0)

## 2019-10-21 ENCOUNTER — Telehealth: Payer: Self-pay | Admitting: Neurology

## 2019-10-21 NOTE — Telephone Encounter (Signed)
Pt called, pharmacy denied filling modafinil (PROVIGIL) 200 MG tablet. Pharmacy request a call from the physician. Also what old prescription I do not take any longer. Would like a call from the nurse.

## 2019-10-24 ENCOUNTER — Ambulatory Visit: Payer: Medicare PPO | Admitting: Physician Assistant

## 2019-10-24 NOTE — Telephone Encounter (Signed)
Called [facility] at [phone]. They do not open until 9am. I will try again later.

## 2019-10-24 NOTE — Telephone Encounter (Addendum)
Called pharmacy back. They needed PA last week to fill med. PA approved on 10/19/19. Pt picked up medication on 10/21/19. Nothing further needed. I called pt back. She does not believe she got modafinil from pharmacy. Husband picked up methocarbamol and bupropion. She will double check with husband on the modafinil and will f/u with pharmacy if need be. She is aware methocarbamol added for muscle spasms and should take prn, dose increased for bupriopion, she should stop amantadine and start modafinil. She will call back if she has any further questions/concerns.

## 2019-10-25 ENCOUNTER — Telehealth: Payer: Self-pay | Admitting: Neurology

## 2019-10-25 NOTE — Telephone Encounter (Signed)
Samantha @ [facility] called to have a note in the system that pt is transferring from [employer] by [employer] to [employer], this is [employer]

## 2019-10-25 NOTE — Telephone Encounter (Signed)
Noted, [facility]

## 2019-11-02 ENCOUNTER — Telehealth: Payer: Self-pay | Admitting: Physician Assistant

## 2019-11-02 ENCOUNTER — Telehealth: Payer: Self-pay | Admitting: Neurology

## 2019-11-02 MED ORDER — PROPRANOLOL HCL 10 MG PO TABS: 10.0000 mg | ORAL_TABLET | Freq: Two times a day (BID) | ORAL | 0 refills | Status: DC

## 2019-11-02 MED ORDER — MODAFINIL 200 MG PO TABS: 200.0000 mg | ORAL_TABLET | Freq: Every day | ORAL | 5 refills | Status: DC

## 2019-11-02 NOTE — Telephone Encounter (Signed)
Pt's medication was sent to pt's pharmacy as requested. Confirmation received.  °

## 2019-11-02 NOTE — Telephone Encounter (Signed)
*  STAT* If patient is at the pharmacy, call can be transferred to refill team.   1. Which medications need to be refilled? (please list name of each medication and dose if known) propranolol   2. Which pharmacy/location (including street and city if local pharmacy) is medication to be sent to? [facility]  3. Do they need a 30 day or 90 day supply? 30 alos the pharmacey states it has been about 2 weeks.

## 2019-11-02 NOTE — Telephone Encounter (Signed)
Called pt. Confirmed with her that she no longer is using [facility]. Would like refills to go to [facility].  I reviewed her chart. She was last seen 10/19/19 and next f/u 04/16/20. Checked drug registry. She last refilled modafinil 10/21/19 #30. I called [facility] and cx any remaining refills on file for modafinil and bupriopion. E-scribed bupriopion to [facility]. Sent rx modafinil to MD to e-scribe.

## 2019-11-02 NOTE — Telephone Encounter (Signed)
[facility] request prescription refill  Bupropion and  Modafinil at [facility] [place], [place]

## 2019-11-03 ENCOUNTER — Other Ambulatory Visit: Payer: Self-pay | Admitting: *Deleted

## 2019-11-03 DIAGNOSIS — M1990 Unspecified osteoarthritis, unspecified site: Secondary | ICD-10-CM | POA: Diagnosis not present

## 2019-11-03 DIAGNOSIS — F329 Major depressive disorder, single episode, unspecified: Secondary | ICD-10-CM | POA: Diagnosis not present

## 2019-11-03 DIAGNOSIS — I10 Essential (primary) hypertension: Secondary | ICD-10-CM | POA: Diagnosis not present

## 2019-11-03 DIAGNOSIS — E7800 Pure hypercholesterolemia, unspecified: Secondary | ICD-10-CM | POA: Diagnosis not present

## 2019-11-03 DIAGNOSIS — G4700 Insomnia, unspecified: Secondary | ICD-10-CM | POA: Diagnosis not present

## 2019-11-03 DIAGNOSIS — F322 Major depressive disorder, single episode, severe without psychotic features: Secondary | ICD-10-CM | POA: Diagnosis not present

## 2019-11-03 DIAGNOSIS — F324 Major depressive disorder, single episode, in partial remission: Secondary | ICD-10-CM | POA: Diagnosis not present

## 2019-11-03 DIAGNOSIS — E039 Hypothyroidism, unspecified: Secondary | ICD-10-CM | POA: Diagnosis not present

## 2019-11-03 DIAGNOSIS — D509 Iron deficiency anemia, unspecified: Secondary | ICD-10-CM | POA: Diagnosis not present

## 2019-11-03 MED ORDER — BUPROPION HCL ER (XL) 300 MG PO TB24: 300.0000 mg | ORAL_TABLET | Freq: Every day | ORAL | 11 refills | Status: AC

## 2019-11-03 NOTE — Telephone Encounter (Signed)
[facility]) called have not received prescription for buPROPion (WELLBUTRIN XL) 300 MG 24 hr tablet.

## 2019-11-03 NOTE — Telephone Encounter (Signed)
E-scribed rx [facility]upropion to [facility].

## 2019-11-09 ENCOUNTER — Encounter: Payer: Medicare PPO | Admitting: Physician Assistant

## 2019-11-09 ENCOUNTER — Other Ambulatory Visit: Payer: Self-pay

## 2019-11-09 NOTE — Progress Notes (Deleted)
{Choose 1 Note Type (Video or Telephone):[phone]}    Date:  11/09/2019   ID:  Summer Holloway, DOB June 29, 1950, MRN [id number] The patient was identified using 2 identifiers.  {Patient Location:[phone]::"Home"} {Provider Location:[phone]::"Home Office"}  PCP:  Marden Noble, MD  Cardiologist:  Yvonne Kendall, MD *** Electrophysiologist:  None   Evaluation Performed:  {Choose Visit Type:[phone]::"Follow-Up Visit"}  Chief Complaint:  ***  Patient Profile: Summer Holloway is a 69 y.o. female with:  Multiple Sclerosis  Hypertension   Hyperlipidemia  GERD  Fibromyalgia  Depression/anxiety  Chest pain  ? Myoview 9/18: no ischemia  Palpitations; hx of PVCs ? Managed with beta-blocker   Prior CV Studies: ***Nuclear stress test9/25/18 Normal stress nuclear study with no ischemia or infarction; EF 66 with normal wall motion.  Nuclear stress test6/16/10 No ischemia   History of Present Illness:   Summer Holloway ***  Past Medical History:  Diagnosis Date   Anxiety    Bursitis of left hip    Depression    Dysrhythmia    diagnosed pvc 1980   Esophageal reflux    Fibromyalgia    Hearing loss    Hyperlipemia    Hyperlipidemia    Hypertension    Hypothyroidism    Multiple sclerosis (HCC)    Osteoarthritis    PVC (premature ventricular contraction)    Sciatica    Tinnitus    Uterine prolapse    Past Surgical History:  Procedure Laterality Date   BREAST BIOPSY  05/24/2002   BREAST BIOPSY Left 05/30/2016   negative   CATARACT EXTRACTION, BILATERAL     CHOLECYSTECTOMY N/A 03/24/2019   Procedure: LAPAROSCOPIC CHOLECYSTECTOMY;  Surgeon: Berna Bue, MD;  Location: [facility];  Service: General;  Laterality: N/A;   PARTIAL HYSTERECTOMY     surgical mesh     of uterine   vaginal hemorrhoid surgery        No outpatient medications have been marked as taking for the 11/09/19 encounter (Appointment) with Tereso Newcomer  T, PA-C.     Allergies:   Air cabin crew   Social History   Tobacco Use   Smoking status: Never Smoker   Smokeless tobacco: Never Used  Vaping Use   Vaping Use: Never used  Substance Use Topics   Alcohol use: No   Drug use: Never     Family Hx: The patient's family history includes Anxiety disorder in her mother; CAD in her father; Fibromyalgia in her mother; Heart attack in her cousin; Heart attack (age of onset: 17) in her father.  ROS:   Please see the history of present illness.    *** All other systems reviewed and are negative.   Prior CV studies:   The following studies were reviewed today:  ***  Labs/Other Tests and Data Reviewed:    EKG:  {EKG/Telemetry Strips Reviewed:[phone]}  Recent Labs: 03/25/2019: ALT 62; BUN 10; Creatinine, Ser 0.78; Magnesium 2.4; Potassium 4.6; Sodium 140 10/19/2019: Hemoglobin 10.5; Platelets 395   Recent Lipid Panel No results found for: CHOL, TRIG, HDL, CHOLHDL, LDLCALC, LDLDIRECT   Labs from PCP pressure reviewed and interpreted ([facility]) 09/21/2018: Total cholesterol 193, HDL 60, LDL 113, triglycerides 103  Wt Readings from Last 3 Encounters:  10/19/19 182 lb (82.6 kg)  08/15/19 183 lb (83 kg)  03/25/19 184 lb (83.5 kg)     Risk Assessment/Calculations:   {Does this patient have ATRIAL FIBRILLATION?:[phone]}   10-year ASCVD risk: 10.2%  Objective:    Vital Signs:  There were no  vitals taken for this visit.   {[facility] Virtual Exam (Optional):[phone]::"VITAL SIGNS:  reviewed"}  ASSESSMENT & PLAN:    1. ***  ***1. Essential hypertension The patient's blood pressure is controlled on her current regimen.  Continue current therapy.    2. Palpitations Controlled on beta-blocker therapy.  Continue current management. ***  COVID-19 Education: The signs and symptoms of COVID-19 were discussed with the patient and how to seek care for testing (follow up with PCP or arrange E-visit).  ***The importance of  social distancing was discussed today.  Time:   Today, I have spent *** minutes with the patient with telehealth technology discussing the above problems.     Medication Adjustments/Labs and Tests Ordered: Current medicines are reviewed at length with the patient today.  Concerns regarding medicines are outlined above.   Tests Ordered: No orders of the defined types were placed in this encounter.   Medication Changes: No orders of the defined types were placed in this encounter.   Follow Up:  {F/U [phone]} {follow up:15908}  Signed, Tereso Newcomer, PA-C  11/09/2019 8:34 AM    [facility] This encounter was created in error - please disregard.

## 2019-11-10 NOTE — Progress Notes (Signed)
error 

## 2019-11-20 NOTE — Progress Notes (Signed)
Virtual Visit via Telephone Note   This visit type was conducted due to national recommendations for restrictions regarding the COVID-19 Pandemic (e.g. social distancing) in an effort to limit this patient's exposure and mitigate transmission in our community.  Due to her co-morbid illnesses, this patient is at least at moderate risk for complications without adequate follow up.  This format is felt to be most appropriate for this patient at this time.  The patient did not have access to video technology/had technical difficulties with video requiring transitioning to audio format only (telephone).  All issues noted in this document were discussed and addressed.  No physical exam could be performed with this format.  Please refer to the patient's chart for her  consent to telehealth for [facility].    Date:  11/21/2019   ID:  Summer Holloway, DOB May 13, 1950, MRN [id number] The patient was identified using 2 identifiers.  Patient Location: Home Provider Location: Office/Clinic  PCP:  Marden Noble, MD  Cardiologist:  Tonny Bollman, MD  / Tereso Newcomer, PA-C  Electrophysiologist:  None   Evaluation Performed:  Follow-Up Visit  Chief Complaint:  Follow-up (Palpitations)    Patient Profile: Summer Holloway is a 69 y.o. female with:  Multiple Sclerosis  Hypertension   Hyperlipidemia  GERD  Fibromyalgia  Chest pain  ? Myoview 9/18: no ischemia  Palpitations; hx of PVCs ? Managed with beta-blocker   Prior CV Studies: Nuclear stress test9/25/18 Normal stress nuclear study with no ischemia or infarction; EF 66 with normal wall motion.  Nuclear stress test6/16/10 No ischemia   History of Present Illness:   Summer Holloway was last seen via telemedicine in 09/2018.  She is seen for follow up.  She continues to do well without recurrent palpitations on beta-blocker therapy.  She has not had chest pain, shortness of breath, syncope.     Past Medical History:    Diagnosis Date  . Anxiety   . Bursitis of left hip   . Depression   . Dysrhythmia    diagnosed pvc 1980  . Esophageal reflux   . Fibromyalgia   . Hearing loss   . Hyperlipemia   . Hyperlipidemia   . Hypertension   . Hypothyroidism   . Multiple sclerosis (HCC)   . Osteoarthritis   . PVC (premature ventricular contraction)   . Sciatica   . Tinnitus   . Uterine prolapse    Past Surgical History:  Procedure Laterality Date  . BREAST BIOPSY  05/24/2002  . BREAST BIOPSY Left 05/30/2016   negative  . CATARACT EXTRACTION, BILATERAL    . CHOLECYSTECTOMY N/A 03/24/2019   Procedure: LAPAROSCOPIC CHOLECYSTECTOMY;  Surgeon: Berna Bue, MD;  Location: [facility];  Service: General;  Laterality: N/A;  . PARTIAL HYSTERECTOMY    . surgical mesh     of uterine  . vaginal hemorrhoid surgery        Current Meds  Medication Sig  . AMANTADINE HCL PO Take by mouth.  Marland Kitchen amitriptyline (ELAVIL) 50 MG tablet Take 25 mg by mouth at bedtime.  Marland Kitchen amLODipine (NORVASC) 5 MG tablet Take 5 mg by mouth daily. Per patient she takes on Mon, Wed, Friday  . buPROPion (WELLBUTRIN XL) 300 MG 24 hr tablet Take 1 tablet (300 mg total) by mouth daily.  . DULoxetine (CYMBALTA) 60 MG capsule Take 60 mg by mouth daily.  Marland Kitchen levothyroxine (SYNTHROID, LEVOTHROID) 50 MCG tablet Take 50 mcg by mouth daily before breakfast.  . methocarbamol (ROBAXIN) 500  MG tablet Take 1 tablet (500 mg total) by mouth 2 (two) times daily as needed for muscle spasms.  . modafinil (PROVIGIL) 200 MG tablet Take 1 tablet (200 mg total) by mouth daily.  Marland Kitchen ocrelizumab (OCREVUS) 300 MG/10ML injection Inject 600 mg into the vein every 6 (six) months.  . pantoprazole (PROTONIX) 40 MG tablet Take 40 mg by mouth daily.  . pregabalin (LYRICA) 100 MG capsule Take 100 mg by mouth 2 (two) times daily.   . propranolol (INDERAL) 10 MG tablet Take 1 tablet (10 mg total) by mouth 2 (two) times daily. Please keep upcoming appt in October for future refills.  Thank you  . rosuvastatin (CRESTOR) 5 MG tablet Take 5 mg by mouth every Monday, Wednesday, and Friday.   . traMADol (ULTRAM) 50 MG tablet Take 1 tablet (50 mg total) by mouth every 6 (six) hours as needed. For pain not relieved by tylenol or ibuprofen     Allergies:   Pizza flavor   Social History   Tobacco Use  . Smoking status: Never Smoker  . Smokeless tobacco: Never Used  Vaping Use  . Vaping Use: Never used  Substance Use Topics  . Alcohol use: No  . Drug use: Never     Family Hx: The patient's family history includes Anxiety disorder in her mother; CAD in her father; Fibromyalgia in her mother; Heart attack in her cousin; Heart attack (age of onset: 79) in her father.  ROS:   Please see the history of present illness.      Labs/Other Tests and Data Reviewed:    EKG:  An ECG dated 03/22/19 was personally reviewed today and demonstrated:  NSR, HR 72, normal axis, no ST-TW changes.  Recent Labs: 03/25/2019: ALT 62; BUN 10; Creatinine, Ser 0.78; Magnesium 2.4; Potassium 4.6; Sodium 140 10/19/2019: Hemoglobin 10.5; Platelets 395   Recent Lipid Panel No results found for: CHOL, TRIG, HDL, CHOLHDL, LDLCALC, LDLDIRECT  Wt Readings from Last 3 Encounters:  11/21/19 182 lb (82.6 kg)  10/19/19 182 lb (82.6 kg)  08/15/19 183 lb (83 kg)     Risk Assessment/Calculations:      Objective:    Vital Signs:  Ht 5\' 7"  (1.702 m)   Wt 182 lb (82.6 kg)   BMI 28.51 kg/m    VITAL SIGNS:  reviewed GEN:  no acute distress PSYCH:  normal affect  ASSESSMENT & PLAN:    1. Palpitations Overall controlled on beta-blocker Rx.  Continue current dose of Propranolol and follow up in 1 year or sooner if needed.    Time:   Today, I have spent 5 minutes with the patient with telehealth technology discussing the above problems.     Medication Adjustments/Labs and Tests Ordered: Current medicines are reviewed at length with the patient today.  Concerns regarding medicines are outlined  above.   Tests Ordered: No orders of the defined types were placed in this encounter.   Medication Changes: No orders of the defined types were placed in this encounter.   Follow Up:  In Person in 1 year(s)  Signed, , PA-C  11/21/2019 12:43 PM    [facility]

## 2019-11-21 ENCOUNTER — Telehealth: Payer: Self-pay | Admitting: *Deleted

## 2019-11-21 ENCOUNTER — Telehealth (INDEPENDENT_AMBULATORY_CARE_PROVIDER_SITE_OTHER): Payer: Medicare PPO | Admitting: Physician Assistant

## 2019-11-21 ENCOUNTER — Encounter: Payer: Self-pay | Admitting: Physician Assistant

## 2019-11-21 ENCOUNTER — Other Ambulatory Visit: Payer: Self-pay

## 2019-11-21 VITALS — Ht 67.0 in | Wt 182.0 lb

## 2019-11-21 DIAGNOSIS — R002 Palpitations: Secondary | ICD-10-CM | POA: Diagnosis not present

## 2019-11-21 DIAGNOSIS — I10 Essential (primary) hypertension: Secondary | ICD-10-CM

## 2019-11-21 NOTE — Patient Instructions (Addendum)
Medication Instructions:  Your physician recommends that you continue on your current medications as directed. Please refer to the Current Medication list given to you today.  *If you need a refill on your cardiac medications before your next appointment, please call your pharmacy*  Lab Work: None ordered today If you have labs (blood work) drawn today and your tests are completely normal, you will receive your results only by: Marland Kitchen MyChart Message (if you have MyChart) OR . A paper copy in the mail If you have any lab test that is abnormal or we need to change your treatment, we will call you to review the results.   Follow-Up: At [facility], you and your health needs are our priority.  As part of our continuing mission to provide you with exceptional heart care, we have created designated Provider Care Teams.  These Care Teams include your primary Cardiologist (physician) and Advanced Practice Providers (APPs -  Physician Assistants and Nurse Practitioners) who all work together to provide you with the care you need, when you need it.  Your next appointment:   1 year(s) You will get a letter when it's time to schedule this appointment. When you get that letter, call us and we will get that scheduled for you.  The format for your next appointment:   In Person  Provider:   You may see Tonny Bollman, MD or one of the following Advanced Practice Providers on your designated Care Team:    Tereso Newcomer, PA-C  [place], [place]

## 2019-11-21 NOTE — Telephone Encounter (Signed)
  Patient Consent for Virtual Visit         Summer Holloway has provided verbal consent on 11/21/2019 for a virtual visit (video or telephone).   CONSENT FOR VIRTUAL VISIT FOR:  Summer Holloway  By participating in this virtual visit I agree to the following:  I hereby voluntarily request, consent and authorize [facility] and its employed or contracted physicians, physician assistants, nurse practitioners or other licensed health care professionals (the Practitioner), to provide me with telemedicine health care services (the "Services") as deemed necessary by the treating Practitioner. I acknowledge and consent to receive the Services by the Practitioner via telemedicine. I understand that the telemedicine visit will involve communicating with the Practitioner through live audiovisual communication technology and the disclosure of certain medical information by electronic transmission. I acknowledge that I have been given the opportunity to request an in-person assessment or other available alternative prior to the telemedicine visit and am voluntarily participating in the telemedicine visit.  I understand that I have the right to withhold or withdraw my consent to the use of telemedicine in the course of my care at any time, without affecting my right to future care or treatment, and that the Practitioner or I may terminate the telemedicine visit at any time. I understand that I have the right to inspect all information obtained and/or recorded in the course of the telemedicine visit and may receive copies of available information for a reasonable fee.  I understand that some of the potential risks of receiving the Services via telemedicine include:  Marland Kitchen Delay or interruption in medical evaluation due to technological equipment failure or disruption; . Information transmitted may not be sufficient (e.g. poor resolution of images) to allow for appropriate medical decision making by the  Practitioner; and/or  . In rare instances, security protocols could fail, causing a breach of personal health information.  Furthermore, I acknowledge that it is my responsibility to provide information about my medical history, conditions and care that is complete and accurate to the best of my ability. I acknowledge that Practitioner's advice, recommendations, and/or decision may be based on factors not within their control, such as incomplete or inaccurate data provided by me or distortions of diagnostic images or specimens that may result from electronic transmissions. I understand that the practice of medicine is not an exact science and that Practitioner makes no warranties or guarantees regarding treatment outcomes. I acknowledge that a copy of this consent can be made available to me via my patient portal [facility] MyChart), or I can request a printed copy by calling the office of [facility].    I understand that my insurance will be billed for this visit.   I have read or had this consent read to me. . I understand the contents of this consent, which adequately explains the benefits and risks of the Services being provided via telemedicine.  . I have been provided ample opportunity to ask questions regarding this consent and the Services and have had my questions answered to my satisfaction. . I give my informed consent for the services to be provided through the use of telemedicine in my medical care

## 2019-12-13 ENCOUNTER — Telehealth: Payer: Self-pay | Admitting: Cardiovascular Disease

## 2019-12-13 ENCOUNTER — Telehealth: Payer: Self-pay | Admitting: Neurology

## 2019-12-13 DIAGNOSIS — E039 Hypothyroidism, unspecified: Secondary | ICD-10-CM | POA: Diagnosis not present

## 2019-12-13 DIAGNOSIS — F324 Major depressive disorder, single episode, in partial remission: Secondary | ICD-10-CM | POA: Diagnosis not present

## 2019-12-13 DIAGNOSIS — G4700 Insomnia, unspecified: Secondary | ICD-10-CM | POA: Diagnosis not present

## 2019-12-13 DIAGNOSIS — I10 Essential (primary) hypertension: Secondary | ICD-10-CM | POA: Diagnosis not present

## 2019-12-13 DIAGNOSIS — E7800 Pure hypercholesterolemia, unspecified: Secondary | ICD-10-CM | POA: Diagnosis not present

## 2019-12-13 DIAGNOSIS — F322 Major depressive disorder, single episode, severe without psychotic features: Secondary | ICD-10-CM | POA: Diagnosis not present

## 2019-12-13 DIAGNOSIS — K219 Gastro-esophageal reflux disease without esophagitis: Secondary | ICD-10-CM | POA: Diagnosis not present

## 2019-12-13 DIAGNOSIS — F329 Major depressive disorder, single episode, unspecified: Secondary | ICD-10-CM | POA: Diagnosis not present

## 2019-12-13 DIAGNOSIS — D509 Iron deficiency anemia, unspecified: Secondary | ICD-10-CM | POA: Diagnosis not present

## 2019-12-13 MED ORDER — PROPRANOLOL HCL 10 MG PO TABS: 10.0000 mg | ORAL_TABLET | Freq: Two times a day (BID) | ORAL | 11 refills | Status: AC

## 2019-12-13 MED ORDER — MODAFINIL 100 MG PO TABS: 100.0000 mg | ORAL_TABLET | Freq: Every day | ORAL | 5 refills | Status: AC

## 2019-12-13 NOTE — Addendum Note (Signed)
Addended by: Arther Abbott on: 12/13/2019 04:07 PM   Modules accepted: Orders

## 2019-12-13 NOTE — Telephone Encounter (Signed)
Lets change the modafinil to 100 mg po daily so she can more easily adjust the dosage down    Ocrevus is the only medication that has shown some benefit for primary progressive MS.   SOme people don't seem to get a beenfit though.  We could stop the Ocrevus.

## 2019-12-13 NOTE — Telephone Encounter (Signed)
Called and spoke with pt. Relayed Dr. Bonnita Hollow message. She is agreeable to try lower dose of modafinil. Advised we will send in new script. [facility] and cx 200mg  tab on file.   She has decided to try going off Ocrevus. Pt advised to call if she develops any new or worsening sx. I informed Liane, RN in infusion suite to cx 02/14/20 appt.

## 2019-12-13 NOTE — Addendum Note (Signed)
Addended by: Asa Lente on: 12/13/2019 06:15 PM   Modules accepted: Orders

## 2019-12-13 NOTE — Telephone Encounter (Signed)
Pt called again and wanted to add to her message that she would also like to discuss about getting off of the ocrelizumab (OCREVUS) 300 MG/10ML injection and she would like to know if she would have to get on another medication in place of it. Please advise.

## 2019-12-13 NOTE — Telephone Encounter (Signed)
Pt has called to report that re: her modafinil (PROVIGIL) 200 MG tablet she was taking 1/2 a tablet and it was still keeping her up at night.  Pt states her Prescription specialist(thru [facility]) made the suggestion that she takes it down to 1/4.  Pt states she has been down, she feels she should be up more.  Pt feels she needs a dose between 1/2 and a 1/4 of the modafinil (PROVIGIL) 200 MG tablet.  Please call.

## 2019-12-13 NOTE — Telephone Encounter (Signed)
Pt's medication was sent to pt's pharmacy as requested. Confirmation received.  °

## 2019-12-13 NOTE — Telephone Encounter (Signed)
*  STAT* If patient is at the pharmacy, call can be transferred to refill team.   1. Which medications need to be refilled? (please list name of each medication and dose if known)  propranolol (INDERAL) 10 MG tablet  2. Which pharmacy/location (including street and city if local pharmacy) is medication to be sent to? [facility] - [place], [place] - [place] [id number]  3. Do they need a 30 day or 90 day supply? 30 day supply

## 2019-12-14 ENCOUNTER — Telehealth: Payer: Self-pay | Admitting: Cardiovascular Disease

## 2019-12-14 NOTE — Telephone Encounter (Signed)
Pt aware refill sent yesterday via epic ./cy

## 2019-12-14 NOTE — Telephone Encounter (Signed)
°*  STAT* If patient is at the pharmacy, call can be transferred to refill team.   1. Which medications need to be refilled? (please list name of each medication and dose if known)  propranolol (INDERAL) 10 MG tablet  2. Which pharmacy/location (including street and city if local pharmacy) is medication to be sent to? [facility] - [place], [place] - [place] [address]  3. Do they need a 30 day or 90 day supply? 90

## 2020-01-12 DIAGNOSIS — F329 Major depressive disorder, single episode, unspecified: Secondary | ICD-10-CM | POA: Diagnosis not present

## 2020-01-12 DIAGNOSIS — E7800 Pure hypercholesterolemia, unspecified: Secondary | ICD-10-CM | POA: Diagnosis not present

## 2020-01-12 DIAGNOSIS — G4700 Insomnia, unspecified: Secondary | ICD-10-CM | POA: Diagnosis not present

## 2020-01-12 DIAGNOSIS — D509 Iron deficiency anemia, unspecified: Secondary | ICD-10-CM | POA: Diagnosis not present

## 2020-01-12 DIAGNOSIS — E039 Hypothyroidism, unspecified: Secondary | ICD-10-CM | POA: Diagnosis not present

## 2020-01-12 DIAGNOSIS — F322 Major depressive disorder, single episode, severe without psychotic features: Secondary | ICD-10-CM | POA: Diagnosis not present

## 2020-01-12 DIAGNOSIS — I10 Essential (primary) hypertension: Secondary | ICD-10-CM | POA: Diagnosis not present

## 2020-01-12 DIAGNOSIS — K219 Gastro-esophageal reflux disease without esophagitis: Secondary | ICD-10-CM | POA: Diagnosis not present

## 2020-01-12 DIAGNOSIS — F324 Major depressive disorder, single episode, in partial remission: Secondary | ICD-10-CM | POA: Diagnosis not present

## 2020-02-03 DIAGNOSIS — F324 Major depressive disorder, single episode, in partial remission: Secondary | ICD-10-CM | POA: Diagnosis not present

## 2020-02-03 DIAGNOSIS — E7800 Pure hypercholesterolemia, unspecified: Secondary | ICD-10-CM | POA: Diagnosis not present

## 2020-02-03 DIAGNOSIS — I10 Essential (primary) hypertension: Secondary | ICD-10-CM | POA: Diagnosis not present

## 2020-02-03 DIAGNOSIS — K219 Gastro-esophageal reflux disease without esophagitis: Secondary | ICD-10-CM | POA: Diagnosis not present

## 2020-02-03 DIAGNOSIS — D509 Iron deficiency anemia, unspecified: Secondary | ICD-10-CM | POA: Diagnosis not present

## 2020-02-03 DIAGNOSIS — F329 Major depressive disorder, single episode, unspecified: Secondary | ICD-10-CM | POA: Diagnosis not present

## 2020-02-03 DIAGNOSIS — F322 Major depressive disorder, single episode, severe without psychotic features: Secondary | ICD-10-CM | POA: Diagnosis not present

## 2020-02-03 DIAGNOSIS — G4700 Insomnia, unspecified: Secondary | ICD-10-CM | POA: Diagnosis not present

## 2020-02-03 DIAGNOSIS — E039 Hypothyroidism, unspecified: Secondary | ICD-10-CM | POA: Diagnosis not present

## 2020-02-06 DIAGNOSIS — U071 COVID-19: Secondary | ICD-10-CM | POA: Diagnosis not present

## 2020-02-06 DIAGNOSIS — G35 Multiple sclerosis: Secondary | ICD-10-CM | POA: Diagnosis not present

## 2020-02-06 DIAGNOSIS — F324 Major depressive disorder, single episode, in partial remission: Secondary | ICD-10-CM | POA: Diagnosis not present

## 2020-02-06 DIAGNOSIS — M1990 Unspecified osteoarthritis, unspecified site: Secondary | ICD-10-CM | POA: Diagnosis not present

## 2020-02-15 ENCOUNTER — Emergency Department: Payer: Medicare PPO

## 2020-02-15 ENCOUNTER — Inpatient Hospital Stay
Admission: EM | Admit: 2020-02-15 | Discharge: 2020-03-13 | DRG: 207 | Disposition: E | Payer: Medicare PPO | Attending: Pulmonary Disease | Admitting: Pulmonary Disease

## 2020-02-15 DIAGNOSIS — Z79890 Hormone replacement therapy (postmenopausal): Secondary | ICD-10-CM | POA: Diagnosis not present

## 2020-02-15 DIAGNOSIS — K219 Gastro-esophageal reflux disease without esophagitis: Secondary | ICD-10-CM | POA: Diagnosis present

## 2020-02-15 DIAGNOSIS — I10 Essential (primary) hypertension: Secondary | ICD-10-CM

## 2020-02-15 DIAGNOSIS — A419 Sepsis, unspecified organism: Secondary | ICD-10-CM | POA: Diagnosis not present

## 2020-02-15 DIAGNOSIS — I5031 Acute diastolic (congestive) heart failure: Secondary | ICD-10-CM | POA: Diagnosis not present

## 2020-02-15 DIAGNOSIS — I110 Hypertensive heart disease with heart failure: Secondary | ICD-10-CM | POA: Diagnosis present

## 2020-02-15 DIAGNOSIS — J15211 Pneumonia due to Methicillin susceptible Staphylococcus aureus: Secondary | ICD-10-CM | POA: Diagnosis not present

## 2020-02-15 DIAGNOSIS — R0902 Hypoxemia: Secondary | ICD-10-CM | POA: Diagnosis not present

## 2020-02-15 DIAGNOSIS — Z6828 Body mass index (BMI) 28.0-28.9, adult: Secondary | ICD-10-CM | POA: Diagnosis not present

## 2020-02-15 DIAGNOSIS — Z66 Do not resuscitate: Secondary | ICD-10-CM | POA: Diagnosis not present

## 2020-02-15 DIAGNOSIS — Z79899 Other long term (current) drug therapy: Secondary | ICD-10-CM | POA: Diagnosis not present

## 2020-02-15 DIAGNOSIS — R0689 Other abnormalities of breathing: Secondary | ICD-10-CM | POA: Diagnosis not present

## 2020-02-15 DIAGNOSIS — U071 COVID-19: Principal | ICD-10-CM

## 2020-02-15 DIAGNOSIS — J9600 Acute respiratory failure, unspecified whether with hypoxia or hypercapnia: Secondary | ICD-10-CM | POA: Diagnosis not present

## 2020-02-15 DIAGNOSIS — R070 Pain in throat: Secondary | ICD-10-CM | POA: Diagnosis not present

## 2020-02-15 DIAGNOSIS — J80 Acute respiratory distress syndrome: Secondary | ICD-10-CM | POA: Diagnosis not present

## 2020-02-15 DIAGNOSIS — E782 Mixed hyperlipidemia: Secondary | ICD-10-CM | POA: Diagnosis present

## 2020-02-15 DIAGNOSIS — Z90711 Acquired absence of uterus with remaining cervical stump: Secondary | ICD-10-CM

## 2020-02-15 DIAGNOSIS — K449 Diaphragmatic hernia without obstruction or gangrene: Secondary | ICD-10-CM | POA: Diagnosis not present

## 2020-02-15 DIAGNOSIS — E876 Hypokalemia: Secondary | ICD-10-CM | POA: Diagnosis present

## 2020-02-15 DIAGNOSIS — R059 Cough, unspecified: Secondary | ICD-10-CM | POA: Diagnosis not present

## 2020-02-15 DIAGNOSIS — G928 Other toxic encephalopathy: Secondary | ICD-10-CM | POA: Diagnosis present

## 2020-02-15 DIAGNOSIS — J9601 Acute respiratory failure with hypoxia: Secondary | ICD-10-CM | POA: Diagnosis not present

## 2020-02-15 DIAGNOSIS — R0602 Shortness of breath: Secondary | ICD-10-CM | POA: Diagnosis not present

## 2020-02-15 DIAGNOSIS — F418 Other specified anxiety disorders: Secondary | ICD-10-CM | POA: Diagnosis not present

## 2020-02-15 DIAGNOSIS — I2510 Atherosclerotic heart disease of native coronary artery without angina pectoris: Secondary | ICD-10-CM | POA: Diagnosis not present

## 2020-02-15 DIAGNOSIS — J9690 Respiratory failure, unspecified, unspecified whether with hypoxia or hypercapnia: Secondary | ICD-10-CM | POA: Diagnosis not present

## 2020-02-15 DIAGNOSIS — R6521 Severe sepsis with septic shock: Secondary | ICD-10-CM | POA: Diagnosis not present

## 2020-02-15 DIAGNOSIS — J1282 Pneumonia due to coronavirus disease 2019: Secondary | ICD-10-CM | POA: Diagnosis present

## 2020-02-15 DIAGNOSIS — N179 Acute kidney failure, unspecified: Secondary | ICD-10-CM | POA: Diagnosis not present

## 2020-02-15 DIAGNOSIS — I428 Other cardiomyopathies: Secondary | ICD-10-CM | POA: Diagnosis not present

## 2020-02-15 DIAGNOSIS — Z4682 Encounter for fitting and adjustment of non-vascular catheter: Secondary | ICD-10-CM | POA: Diagnosis not present

## 2020-02-15 DIAGNOSIS — R918 Other nonspecific abnormal finding of lung field: Secondary | ICD-10-CM | POA: Diagnosis not present

## 2020-02-15 DIAGNOSIS — E039 Hypothyroidism, unspecified: Secondary | ICD-10-CM | POA: Diagnosis present

## 2020-02-15 DIAGNOSIS — J189 Pneumonia, unspecified organism: Secondary | ICD-10-CM | POA: Diagnosis not present

## 2020-02-15 DIAGNOSIS — M797 Fibromyalgia: Secondary | ICD-10-CM | POA: Diagnosis present

## 2020-02-15 DIAGNOSIS — Z515 Encounter for palliative care: Secondary | ICD-10-CM | POA: Diagnosis not present

## 2020-02-15 DIAGNOSIS — D6489 Other specified anemias: Secondary | ICD-10-CM | POA: Diagnosis present

## 2020-02-15 DIAGNOSIS — G35 Multiple sclerosis: Secondary | ICD-10-CM | POA: Diagnosis present

## 2020-02-15 DIAGNOSIS — R0600 Dyspnea, unspecified: Secondary | ICD-10-CM | POA: Diagnosis not present

## 2020-02-15 DIAGNOSIS — Q8789 Other specified congenital malformation syndromes, not elsewhere classified: Secondary | ICD-10-CM

## 2020-02-15 LAB — CBC WITH DIFFERENTIAL/PLATELET
Abs Immature Granulocytes: 0.11 10*3/uL — ABNORMAL HIGH (ref 0.00–0.07)
Basophils Absolute: 0 10*3/uL (ref 0.0–0.1)
Basophils Relative: 0 %
Eosinophils Absolute: 0 10*3/uL (ref 0.0–0.5)
Eosinophils Relative: 0 %
HCT: 32.8 % — ABNORMAL LOW (ref 36.0–46.0)
Hemoglobin: 10.3 g/dL — ABNORMAL LOW (ref 12.0–15.0)
Immature Granulocytes: 1 %
Lymphocytes Relative: 4 %
Lymphs Abs: 0.5 10*3/uL — ABNORMAL LOW (ref 0.7–4.0)
MCH: 24 pg — ABNORMAL LOW (ref 26.0–34.0)
MCHC: 31.4 g/dL (ref 30.0–36.0)
MCV: 76.5 fL — ABNORMAL LOW (ref 80.0–100.0)
Monocytes Absolute: 0.5 10*3/uL (ref 0.1–1.0)
Monocytes Relative: 4 %
Neutro Abs: 13.5 10*3/uL — ABNORMAL HIGH (ref 1.7–7.7)
Neutrophils Relative %: 91 %
Platelets: 395 10*3/uL (ref 150–400)
RBC: 4.29 MIL/uL (ref 3.87–5.11)
RDW: 17.4 % — ABNORMAL HIGH (ref 11.5–15.5)
WBC: 14.7 10*3/uL — ABNORMAL HIGH (ref 4.0–10.5)
nRBC: 0 % (ref 0.0–0.2)

## 2020-02-15 LAB — COMPREHENSIVE METABOLIC PANEL
ALT: 12 U/L (ref 0–44)
AST: 41 U/L (ref 15–41)
Albumin: 2.6 g/dL — ABNORMAL LOW (ref 3.5–5.0)
Alkaline Phosphatase: 187 U/L — ABNORMAL HIGH (ref 38–126)
Anion gap: 14 (ref 5–15)
BUN: 15 mg/dL (ref 8–23)
CO2: 26 mmol/L (ref 22–32)
Calcium: 8.4 mg/dL — ABNORMAL LOW (ref 8.9–10.3)
Chloride: 100 mmol/L (ref 98–111)
Creatinine, Ser: 0.79 mg/dL (ref 0.44–1.00)
GFR, Estimated: >60 mL/min (ref >60)
Glucose, Bld: 85 mg/dL (ref 70–99)
Potassium: 3.3 mmol/L — ABNORMAL LOW (ref 3.5–5.1)
Sodium: 140 mmol/L (ref 135–145)
Total Bilirubin: 0.4 mg/dL (ref 0.3–1.2)
Total Protein: 6.9 g/dL (ref 6.5–8.1)

## 2020-02-15 LAB — LACTIC ACID, PLASMA
Lactic Acid, Venous: 1.7 mmol/L (ref 0.5–1.9)
Lactic Acid, Venous: 1.3 mmol/L (ref 0.5–1.9)

## 2020-02-15 LAB — TROPONIN I (HIGH SENSITIVITY)
Troponin I (High Sensitivity): 11 ng/L (ref <18)
Troponin I (High Sensitivity): 10 ng/L (ref <18)

## 2020-02-15 LAB — POC SARS CORONAVIRUS 2 AG -  ED: SARS Coronavirus 2 Ag: NEGATIVE

## 2020-02-15 LAB — PROCALCITONIN
Procalcitonin: 0.18 ng/mL
Procalcitonin: 0.11 ng/mL

## 2020-02-15 LAB — FIBRIN DERIVATIVES D-DIMER (ARMC ONLY): Fibrin derivatives D-dimer (ARMC): 1801.53 ng/mL (FEU) — ABNORMAL HIGH (ref 0.00–499.00)

## 2020-02-15 LAB — BRAIN NATRIURETIC PEPTIDE: B Natriuretic Peptide: 103.7 pg/mL — ABNORMAL HIGH (ref 0.0–100.0)

## 2020-02-15 MED ORDER — PROPRANOLOL HCL 20 MG PO TABS
10.0000 mg | ORAL_TABLET | Freq: Two times a day (BID) | ORAL | Status: DC
  Administered 2020-02-15 – 2020-02-16 (×4): 10 mg via ORAL
  Filled 2020-02-15 (×5): qty 1

## 2020-02-15 MED ORDER — SODIUM CHLORIDE 0.9% FLUSH
3.0000 mL | Freq: Two times a day (BID) | INTRAVENOUS | Status: DC
  Administered 2020-02-16 – 2020-02-28 (×26): 3 mL via INTRAVENOUS

## 2020-02-15 MED ORDER — METHYLPREDNISOLONE SODIUM SUCC 125 MG IJ SOLR
85.0000 mg | Freq: Once | INTRAMUSCULAR | Status: AC
  Administered 2020-02-15: 85 mg via INTRAVENOUS
  Filled 2020-02-15: qty 2

## 2020-02-15 MED ORDER — SODIUM CHLORIDE 0.9 % IV SOLN
500.0000 mg | INTRAVENOUS | Status: DC
  Administered 2020-02-15: 500 mg via INTRAVENOUS
  Filled 2020-02-15: qty 500

## 2020-02-15 MED ORDER — ZINC SULFATE 220 (50 ZN) MG PO CAPS
220.0000 mg | ORAL_CAPSULE | Freq: Every day | ORAL | Status: DC
  Administered 2020-02-15 – 2020-02-18 (×4): 220 mg via ORAL
  Filled 2020-02-15 (×4): qty 1

## 2020-02-15 MED ORDER — DULOXETINE HCL 30 MG PO CPEP
60.0000 mg | ORAL_CAPSULE | Freq: Every day | ORAL | Status: DC
  Administered 2020-02-15 – 2020-02-20 (×6): 60 mg via ORAL
  Filled 2020-02-15 (×5): qty 2
  Filled 2020-02-15: qty 1
  Filled 2020-02-15: qty 2

## 2020-02-15 MED ORDER — LEVOTHYROXINE SODIUM 50 MCG PO TABS
50.0000 ug | ORAL_TABLET | Freq: Every day | ORAL | Status: DC
  Administered 2020-02-16 – 2020-02-18 (×3): 50 ug via ORAL
  Filled 2020-02-15 (×3): qty 1

## 2020-02-15 MED ORDER — IOHEXOL 350 MG/ML SOLN
75.0000 mL | Freq: Once | INTRAVENOUS | Status: AC | PRN
  Administered 2020-02-15: 75 mL via INTRAVENOUS

## 2020-02-15 MED ORDER — SODIUM CHLORIDE 0.9 % IV SOLN: 100.0000 mg | Freq: Every day | INTRAVENOUS | Status: DC

## 2020-02-15 MED ORDER — SODIUM CHLORIDE 0.9 % IV SOLN: 250.0000 mL | INTRAVENOUS | Status: DC | PRN

## 2020-02-15 MED ORDER — GUAIFENESIN-DM 100-10 MG/5ML PO SYRP: 10.0000 mL | ORAL_SOLUTION | ORAL | Status: DC | PRN

## 2020-02-15 MED ORDER — ASPIRIN-ACETAMINOPHEN-CAFFEINE 250-250-65 MG PO TABS
1.0000 | ORAL_TABLET | Freq: Four times a day (QID) | ORAL | Status: DC | PRN
  Filled 2020-02-15: qty 1

## 2020-02-15 MED ORDER — ALBUTEROL SULFATE HFA 108 (90 BASE) MCG/ACT IN AERS
2.0000 | INHALATION_SPRAY | Freq: Four times a day (QID) | RESPIRATORY_TRACT | Status: DC
  Administered 2020-02-15 – 2020-02-21 (×10): 2 via RESPIRATORY_TRACT
  Filled 2020-02-15 (×3): qty 6.7

## 2020-02-15 MED ORDER — ASCORBIC ACID 500 MG PO TABS
500.0000 mg | ORAL_TABLET | Freq: Every day | ORAL | Status: DC
  Administered 2020-02-15 – 2020-02-18 (×4): 500 mg via ORAL
  Filled 2020-02-15 (×4): qty 1

## 2020-02-15 MED ORDER — ACETAMINOPHEN 325 MG PO TABS
650.0000 mg | ORAL_TABLET | Freq: Four times a day (QID) | ORAL | Status: DC | PRN
  Administered 2020-02-15 – 2020-02-16 (×2): 650 mg via ORAL
  Filled 2020-02-15 (×2): qty 2

## 2020-02-15 MED ORDER — PHENOL 1.4 % MT LIQD
1.0000 | OROMUCOSAL | Status: DC | PRN
  Administered 2020-02-16: 1 via OROMUCOSAL
  Filled 2020-02-15 (×2): qty 177

## 2020-02-15 MED ORDER — BUPROPION HCL ER (XL) 300 MG PO TB24
300.0000 mg | ORAL_TABLET | Freq: Every day | ORAL | Status: DC
  Administered 2020-02-15 – 2020-02-20 (×6): 300 mg via ORAL
  Filled 2020-02-15 (×3): qty 1
  Filled 2020-02-15: qty 2
  Filled 2020-02-15: qty 1
  Filled 2020-02-15: qty 2
  Filled 2020-02-15: qty 1

## 2020-02-15 MED ORDER — ENOXAPARIN SODIUM 40 MG/0.4ML ~~LOC~~ SOLN
40.0000 mg | SUBCUTANEOUS | Status: DC
  Administered 2020-02-15 – 2020-02-18 (×4): 40 mg via SUBCUTANEOUS
  Filled 2020-02-15 (×4): qty 0.4

## 2020-02-15 MED ORDER — LEVOFLOXACIN IN D5W 750 MG/150ML IV SOLN: 750.0000 mg | INTRAVENOUS | Status: DC

## 2020-02-15 MED ORDER — SODIUM CHLORIDE 0.9 % IV SOLN
1.0000 g | INTRAVENOUS | Status: DC
  Administered 2020-02-15: 1 g via INTRAVENOUS
  Filled 2020-02-15: qty 10

## 2020-02-15 MED ORDER — SODIUM CHLORIDE 0.9 % IV SOLN
200.0000 mg | Freq: Once | INTRAVENOUS | Status: DC
  Filled 2020-02-15: qty 40

## 2020-02-15 MED ORDER — PREGABALIN 50 MG PO CAPS
100.0000 mg | ORAL_CAPSULE | Freq: Two times a day (BID) | ORAL | Status: DC
  Administered 2020-02-15 – 2020-02-18 (×8): 100 mg via ORAL
  Filled 2020-02-15: qty 4
  Filled 2020-02-15 (×6): qty 2
  Filled 2020-02-15: qty 4

## 2020-02-15 MED ORDER — SODIUM CHLORIDE 0.9 % IV SOLN: Freq: Once | INTRAVENOUS | Status: AC

## 2020-02-15 MED ORDER — AMITRIPTYLINE HCL 25 MG PO TABS
25.0000 mg | ORAL_TABLET | Freq: Every day | ORAL | Status: DC
  Administered 2020-02-15 – 2020-02-18 (×4): 25 mg via ORAL
  Filled 2020-02-15 (×4): qty 1

## 2020-02-15 MED ORDER — PREDNISONE 20 MG PO TABS: 50.0000 mg | ORAL_TABLET | Freq: Every day | ORAL | Status: DC

## 2020-02-15 MED ORDER — TRAMADOL HCL 50 MG PO TABS: 50.0000 mg | ORAL_TABLET | Freq: Four times a day (QID) | ORAL | Status: DC | PRN

## 2020-02-15 MED ORDER — HYDROCODONE-HOMATROPINE 5-1.5 MG/5ML PO SYRP
5.0000 mL | ORAL_SOLUTION | Freq: Four times a day (QID) | ORAL | Status: DC | PRN
  Administered 2020-02-16 (×2): 5 mL via ORAL
  Filled 2020-02-15 (×3): qty 5

## 2020-02-15 MED ORDER — SODIUM CHLORIDE 0.9% FLUSH: 3.0000 mL | INTRAVENOUS | Status: DC | PRN

## 2020-02-15 MED ORDER — ONDANSETRON HCL 4 MG/2ML IJ SOLN: 4.0000 mg | Freq: Four times a day (QID) | INTRAMUSCULAR | Status: DC | PRN

## 2020-02-15 MED ORDER — ONDANSETRON HCL 4 MG PO TABS: 4.0000 mg | ORAL_TABLET | Freq: Four times a day (QID) | ORAL | Status: DC | PRN

## 2020-02-15 MED ORDER — PANTOPRAZOLE SODIUM 40 MG PO TBEC
40.0000 mg | DELAYED_RELEASE_TABLET | Freq: Every day | ORAL | Status: DC
  Administered 2020-02-15 – 2020-02-18 (×4): 40 mg via ORAL
  Filled 2020-02-15 (×4): qty 1

## 2020-02-15 MED ORDER — MODAFINIL 100 MG PO TABS
100.0000 mg | ORAL_TABLET | Freq: Every day | ORAL | Status: DC
  Administered 2020-02-15: 100 mg via ORAL
  Filled 2020-02-15: qty 1

## 2020-02-15 MED ORDER — METHYLPREDNISOLONE SODIUM SUCC 125 MG IJ SOLR
0.5000 mg/kg | Freq: Two times a day (BID) | INTRAMUSCULAR | Status: DC
  Administered 2020-02-15 – 2020-02-16 (×3): 43.125 mg via INTRAVENOUS
  Filled 2020-02-15 (×4): qty 2

## 2020-02-15 MED ORDER — ALPRAZOLAM 0.25 MG PO TABS
0.2500 mg | ORAL_TABLET | Freq: Every evening | ORAL | Status: DC | PRN
  Administered 2020-02-15 – 2020-02-17 (×3): 0.25 mg via ORAL
  Filled 2020-02-15 (×3): qty 1

## 2020-02-15 MED ORDER — POTASSIUM CHLORIDE CRYS ER 20 MEQ PO TBCR
40.0000 meq | EXTENDED_RELEASE_TABLET | Freq: Once | ORAL | Status: AC
  Administered 2020-02-15: 40 meq via ORAL
  Filled 2020-02-15: qty 2

## 2020-02-15 NOTE — ED Provider Notes (Signed)
[facility] Emergency Department Provider Note  ____________________________________________   Event Date/Time   First MD Initiated Contact with Patient 02-24-2020 1007     (approximate)  I have reviewed the triage vital signs and the nursing notes.   HISTORY  Chief Complaint Shortness of Breath and Cough    HPI Summer Holloway is a 70 y.o. female fully vaccinated, here with shortness of breath.  The patient states that she was diagnosed at home with Covid on January 22.  Since then, she has had increasing cough, fatigue, and shortness of breath.  She states that she initially had some slight improvement after her diagnosis.  However, over the last 24 hours, her shortness of breath has significantly worsened and she now has developed sore throat and lost her voice.  She has associated fatigue.  No fevers.  No abdominal pain.  No nausea or vomiting.  She has not taken anything for her Covid.  No other specific alleviating or aggravating factors.  No other acute complaints.        Past Medical History:  Diagnosis Date  . Anxiety   . Bursitis of left hip   . Depression   . Dysrhythmia    diagnosed pvc 1980  . Esophageal reflux   . Fibromyalgia   . Hearing loss   . Hyperlipemia   . Hyperlipidemia   . Hypertension   . Hypothyroidism   . Multiple sclerosis (HCC)   . Osteoarthritis   . PVC (premature ventricular contraction)   . Sciatica   . Tinnitus   . Uterine prolapse     Patient Active Problem List   Diagnosis Date Noted  . High risk medication use 10/19/2019  . Gait disturbance 10/19/2019  . Numbness 10/19/2019  . Insomnia 10/19/2019  . Other fatigue 10/19/2019  . Vitamin D deficiency 10/19/2019  . Depression with anxiety 10/19/2019  . Acute cholecystitis 03/24/2019  . Essential hypertension 10/07/2017  . Mixed hyperlipidemia 10/07/2017  . Multiple sclerosis (HCC) 04/14/2017  . Atypical chest pain 11/03/2016  . Dyspnea on exertion  11/03/2016  . Palpitations 11/03/2016    Past Surgical History:  Procedure Laterality Date  . BREAST BIOPSY  05/24/2002  . BREAST BIOPSY Left 05/30/2016   negative  . CATARACT EXTRACTION, BILATERAL    . CHOLECYSTECTOMY N/A 03/24/2019   Procedure: LAPAROSCOPIC CHOLECYSTECTOMY;  Surgeon: Berna Bue, MD;  Location: [facility];  Service: General;  Laterality: N/A;  . PARTIAL HYSTERECTOMY    . surgical mesh     of uterine  . vaginal hemorrhoid surgery       Prior to Admission medications   Medication Sig Start Date End Date Taking? Authorizing Provider  AMANTADINE HCL PO Take by mouth.    [provider]  amitriptyline (ELAVIL) 50 MG tablet Take 25 mg by mouth at bedtime.    [provider]  amLODipine (NORVASC) 5 MG tablet Take 5 mg by mouth daily. Per patient she takes on Mon, Wed, Friday    [provider]  buPROPion (WELLBUTRIN XL) 300 MG 24 hr tablet Take 1 tablet (300 mg total) by mouth daily. 11/03/19   Sater, Pearletha Furl, MD  DULoxetine (CYMBALTA) 60 MG capsule Take 60 mg by mouth daily.    [provider]  levothyroxine (SYNTHROID, LEVOTHROID) 50 MCG tablet Take 50 mcg by mouth daily before breakfast.    [provider]  methocarbamol (ROBAXIN) 500 MG tablet Take 1 tablet (500 mg total) by mouth 2 (two) times  daily as needed for muscle spasms. 10/19/19   Sater, Pearletha Furl, MD  modafinil (PROVIGIL) 100 MG tablet Take 1 tablet (100 mg total) by mouth daily. 12/13/19   Sater, Pearletha Furl, MD  ocrelizumab (OCREVUS) 300 MG/10ML injection Inject 600 mg into the vein every 6 (six) months.    [provider]  pantoprazole (PROTONIX) 40 MG tablet Take 40 mg by mouth daily.    [provider]  pregabalin (LYRICA) 100 MG capsule Take 100 mg by mouth 2 (two) times daily.     [provider]  propranolol (INDERAL) 10 MG tablet Take 1 tablet (10 mg total) by mouth 2 (two) times daily. 12/13/19   Tonny Bollman, MD   rosuvastatin (CRESTOR) 5 MG tablet Take 5 mg by mouth every Monday, Wednesday, and Friday.     [provider]  traMADol (ULTRAM) 50 MG tablet Take 1 tablet (50 mg total) by mouth every 6 (six) hours as needed. For pain not relieved by tylenol or ibuprofen 03/24/19 03/23/20  Berna Bue, MD    Allergies Brett Albino flavor  Family History  Problem Relation Age of Onset  . Fibromyalgia Mother   . Anxiety disorder Mother   . Heart attack Father 49  . CAD Father   . Heart attack Cousin     Social History Social History   Tobacco Use  . Smoking status: Never Smoker  . Smokeless tobacco: Never Used  Vaping Use  . Vaping Use: Never used  Substance Use Topics  . Alcohol use: No  . Drug use: Never    Review of Systems  Review of Systems  Constitutional: Positive for chills and fatigue. Negative for fever.  HENT: Negative for sore throat.   Respiratory: Positive for cough, shortness of breath and wheezing.   Cardiovascular: Negative for chest pain.  Gastrointestinal: Positive for nausea. Negative for abdominal pain.  Genitourinary: Negative for flank pain.  Musculoskeletal: Negative for neck pain.  Skin: Negative for rash and wound.  Allergic/Immunologic: Negative for immunocompromised state.  Neurological: Positive for weakness. Negative for numbness.  Hematological: Does not bruise/bleed easily.  All other systems reviewed and are negative.    ____________________________________________  PHYSICAL EXAM:      VITAL SIGNS: ED Triage Vitals  Enc Vitals Group     BP 03/11/2020 1007 139/75     Pulse Rate 03/02/2020 1007 78     Resp 02/24/2020 1007 (!) 26     Temp 03/12/2020 1007 98.8 F (37.1 C)     Temp Source 02/17/2020 1007 Oral     SpO2 03/04/2020 0959 94 %     Weight --      Height --      Head Circumference --      Peak Flow --      Pain Score 02/17/2020 1002 0     Pain Loc --      Pain Edu? --      Excl. in GC? --      Physical Exam Vitals and nursing note  reviewed.  Constitutional:      General: She is not in acute distress.    Appearance: She is well-developed.  HENT:     Head: Normocephalic and atraumatic.  Eyes:     Conjunctiva/sclera: Conjunctivae normal.  Cardiovascular:     Rate and Rhythm: Regular rhythm.     Heart sounds: Normal heart sounds. No murmur heard. No friction rub.  Pulmonary:     Effort: Pulmonary effort is normal. Tachypnea present.  No respiratory distress.     Breath sounds: Examination of the right-upper field reveals rales. Examination of the left-upper field reveals rales. Examination of the right-middle field reveals rales. Examination of the left-middle field reveals rales. Examination of the right-lower field reveals rales. Examination of the left-lower field reveals rales. Rales present. No wheezing.  Abdominal:     General: There is no distension.     Palpations: Abdomen is soft.     Tenderness: There is no abdominal tenderness.  Musculoskeletal:     Cervical back: Neck supple.  Skin:    General: Skin is warm.     Capillary Refill: Capillary refill takes less than 2 seconds.  Neurological:     Mental Status: She is alert and oriented to person, place, and time.     Motor: No abnormal muscle tone.       ____________________________________________   LABS (all labs ordered are listed, but only abnormal results are displayed)  Labs Reviewed  CBC WITH DIFFERENTIAL/PLATELET - Abnormal; Notable for the following components:      Result Value   WBC 14.7 (*)    Hemoglobin 10.3 (*)    HCT 32.8 (*)    MCV 76.5 (*)    MCH 24.0 (*)    RDW 17.4 (*)    Neutro Abs 13.5 (*)    Lymphs Abs 0.5 (*)    Abs Immature Granulocytes 0.11 (*)    All other components within normal limits  COMPREHENSIVE METABOLIC PANEL - Abnormal; Notable for the following components:   Potassium 3.3 (*)    Calcium 8.4 (*)    Albumin 2.6 (*)    Alkaline Phosphatase 187 (*)    All other components within normal limits  FIBRIN  DERIVATIVES D-DIMER ([facility] ONLY) - Abnormal; Notable for the following components:   Fibrin derivatives D-dimer ([facility]) 1,801.53 (*)    All other components within normal limits  BRAIN NATRIURETIC PEPTIDE - Abnormal; Notable for the following components:   B Natriuretic Peptide 103.7 (*)    All other components within normal limits  CULTURE, BLOOD (SINGLE)  LACTIC ACID, PLASMA  LACTIC ACID, PLASMA  PROCALCITONIN  POC SARS CORONAVIRUS 2 AG -  ED  TROPONIN I (HIGH SENSITIVITY)    ____________________________________________  EKG: Normal sinus rhythm, ventricular rate 78.  QRS 75, QTc 41.  No acute ST elevations or depressions.  No KG evidence of acute ischemia or infarct ________________________________________  RADIOLOGY All imaging, including plain films, CT scans, and ultrasounds, independently reviewed by me, and interpretations confirmed via formal radiology reads.  ED MD interpretation:   Chest ray: Extensive patchy bilateral pneumonia  Official radiology report(s): DG Chest Portable 1 View  Result Date: 03/01/2020 CLINICAL DATA:  Hypoxia, COVID, cough, dyspnea EXAM: PORTABLE CHEST 1 VIEW COMPARISON:  04/03/2015 chest radiograph. FINDINGS: Stable cardiomediastinal silhouette with normal heart size. No pneumothorax. No pleural effusion. Extensive patchy opacities throughout both lungs. IMPRESSION: Extensive patchy opacities throughout both lungs, compatible with COVID-19 pneumonia. Electronically Signed   By: Delbert Phenix M.D.   On: 03/01/2020 10:33    ____________________________________________  PROCEDURES   Procedure(s) performed (including Critical Care):  .1-3 Lead EKG Interpretation Performed by: Shaune Pollack, MD Authorized by: Shaune Pollack, MD     Interpretation: normal     ECG rate:  70-90   ECG rate assessment: normal     Rhythm: sinus rhythm     Ectopy: none     Conduction: normal   Comments:     Indication: resp failure .  Critical Care Performed by:  Shaune Pollack, MD Authorized by: Shaune Pollack, MD   Critical care provider statement:    Critical care time (minutes):  35   Critical care time was exclusive of:  Separately billable procedures and treating other patients and teaching time   Critical care was necessary to treat or prevent imminent or life-threatening deterioration of the following conditions:  Respiratory failure, circulatory failure and cardiac failure   Critical care was time spent personally by me on the following activities:  Development of treatment plan with patient or surrogate, discussions with consultants, evaluation of patient's response to treatment, examination of patient, obtaining history from patient or surrogate, ordering and performing treatments and interventions, ordering and review of laboratory studies, ordering and review of radiographic studies, pulse oximetry, re-evaluation of patient's condition and review of old charts   I assumed direction of critical care for this patient from another provider in my specialty: no      ____________________________________________  INITIAL IMPRESSION / MDM / ASSESSMENT AND PLAN / ED COURSE  As part of my medical decision making, I reviewed the following data within the electronic MEDICAL RECORD NUMBER Nursing notes reviewed and incorporated, Old chart reviewed, Notes from prior ED visits, and [place] Controlled Substance Database       *Zitlali Primm was evaluated in Emergency Department on March 08, 2020 for the symptoms described in the history of present illness. She was evaluated in the context of the global COVID-19 pandemic, which necessitated consideration that the patient might be at risk for infection with the SARS-CoV-2 virus that causes COVID-19. Institutional protocols and algorithms that pertain to the evaluation of patients at risk for COVID-19 are in a state of rapid change based on information released by regulatory bodies including the CDC and federal and  state organizations. These policies and algorithms were followed during the patient's care in the ED.  Some ED evaluations and interventions may be delayed as a result of limited staffing during the pandemic.*     Medical Decision Making:  70 yo F here with acute hypoxic resp failure 2/2 COVID-19. D-Dimer elevated, CT Angio pending. CXR reviewed, c/w multifocal PNA 2/2 COVID-19. Labs o/w overall reassuring. Mild leukocytosis but procal negative, doubt superinfection. CMP unremarkable. LA normal. Admit to medicine. IV steroids, remdesevir started, improved on 3-4L [place].   ____________________________________________  FINAL CLINICAL IMPRESSION(S) / ED DIAGNOSES  Final diagnoses:  Acute respiratory failure with hypoxia (HCC)  COVID-19     MEDICATIONS GIVEN DURING THIS VISIT:  Medications  methylPREDNISolone sodium succinate (SOLU-MEDROL) 125 mg/2 mL injection 85 mg (has no administration in time range)  0.9 %  sodium chloride infusion (has no administration in time range)  HYDROcodone-homatropine (HYCODAN) 5-1.5 MG/5ML syrup 5 mL (has no administration in time range)     ED Discharge Orders    None       Note:  This document was prepared using Dragon voice recognition software and may include unintentional dictation errors.   Shaune Pollack, MD 03-08-2020 [id number]

## 2020-02-15 NOTE — ED Notes (Signed)
Pt's husband updated.

## 2020-02-15 NOTE — ED Triage Notes (Signed)
PER [facility], CALLED TO PT'S HOME D/T SOB AND COUGH. HAD COVID 1/22. PT BROUGHT IN ON NRB 15L SPO2 94%. RA SPO2 88%. PT HAS LARYNGITIS SO CANNOT TALK THAT WELL. PT C/O COUGH AS WELL.

## 2020-02-15 NOTE — ED Notes (Signed)
Patient to CT.

## 2020-02-15 NOTE — ED Notes (Signed)
Per Dr. Erma Heritage hold remdesevir until covid test results.

## 2020-02-15 NOTE — ED Notes (Signed)
Noticed patient's oxygen saturation began to decrease, patient was experiencing anxiety and stated she is claustrophobic. Patient was also talking on phone. Patient increased to 4L [place]. Gave xanax to assist with her anxiety. Advised patient to refrain from talking on phone. Provided comfortable environment.

## 2020-02-15 NOTE — Consult Note (Signed)
Pharmacy Antibiotic Note  Summer Holloway is a 70 y.o. female admitted on 03/14/2020 with shortness of breath and cough.    Pharmacy has been consulted for Levofloxacin dosing for CAP.  PCN rash allergy investigated. Patient has tolerated cefazolin and ceftriaxone previously  Plan:  Change levofloxacin to azithromycin 500 mg Q24H and Ceftriaxone 1 gram Q24H  Weight: 86.1 kg (189 lb 13.1 oz)  Temp (24hrs), Avg:98.8 F (37.1 C), Min:98.8 F (37.1 C), Max:98.8 F (37.1 C)  Recent Labs  Lab 03/14/20 1015 Mar 14, 2020 1208  WBC 14.7*  --   CREATININE 0.79  --   LATICACIDVEN 1.7 1.3    Estimated Creatinine Clearance: 73.8 mL/min (by C-G formula based on SCr of 0.79 mg/dL).    Allergies  Allergen Reactions  . Pizza Flavor Nausea And Vomiting  . Penicillins Rash    Antimicrobials this admission: 2/2 Ceftriaxone >>  2/2 Azithromycin >>   Dose adjustments this admission: n/a  Microbiology results: 2/2 BCx: pending   Thank you for allowing pharmacy to be a part of this patient's care.  Sharen Hones, PharmD, BCPS Clinical Pharmacist  03-14-2020 9:18 PM

## 2020-02-15 NOTE — H&P (Signed)
History and Physical    Summer Holloway ZOX:[id number] DOB: 1950-11-26 DOA: 02-19-20  PCP: Marden Noble, MD   Patient coming from: Home  I have personally briefly reviewed patient's old medical records in [facility]  Chief Complaint: Shortness of breath  HPI: Summer Holloway is a 70 y.o. female with medical history significant for fibromyalgia, osteoarthritis, hypertension, hypothyroidism and GERD who was brought into the ER by EMS for evaluation of shortness of breath and cough.  Patient has been sick for about 2 weeks and had a positive COVID-19 home test on 02/04/20.  Her daughter was also sick but has recovered.  Per EMS patient was hypoxic in the field and was placed on a nonrebreather mask @ 15 L and was transported to the ER. She complains of exertional shortness of breath and a cough occasionally productive of yellow phlegm.  She also complains of a severe sore throat, anorexia, extreme fatigue and generalized weakness. She denies having any fever or chills, no nausea, no vomiting, no diarrhea, no abdominal pain, no dizziness, no lightheadedness, no myalgias, no headache, no urinary frequency, no nocturia, no dysuria, no blurred vision, no palpitations, no diaphoresis, no chest pain. Patient is vaccinated against the COVID-19 virus and received her booster dose as well. Labs show sodium 140, potassium 3.3, chloride 100, bicarb 26, glucose 85, BUN 15, creatinine 0.79, calcium 8.4, alkaline phosphatase 187, albumin 2.6, AST 41, ALT 12, total protein 6.9, total bilirubin 0.4, BNP 103, lactic acid 1.3, procalcitonin 0.18, white count 14.7, hemoglobin 10.3, hematocrit 32.8, MCV 76.5, RDW 17.4, platelet count 395, fibrin derivatives 1801 Chest x-ray reviewed by me shows extensive patchy opacities throughout both lungs, compatible with COVID-19 pneumonia CT angiogram of the chest shows extensive multifocal pulmonary infiltrates likely infectious and compatible with those seen in   COVID-19 pneumonia.  Extensive parenchymal involvement.  No pulmonary embolism EKG reviewed by me shows sinus rhythm   ED Course: Patient is a 70 year old female who presents to the ER via EMS for evaluation of shortness of breath and a cough and was found to be hypoxic in the field requiring oxygen supplementation at 15 L nonrebreather mask.  She tested positive for the COVID-19 virus 2 weeks ago on 02/04/20, with a positive home test to her daughter who was infected with COVID-19 virus.  Patient is fully vaccinated against the COVID-19 virus.  Imaging shows multifocal pulmonary infiltrates consistent with COVID-19 pneumonia.  Patient received IV Solu-Medrol and was started on remdesivir in the emergency room.  She will be admitted to the hospital for further evaluation.   Review of Systems: As per HPI otherwise all systems reviewed and negative.    Past Medical History:  Diagnosis Date  . Anxiety   . Bursitis of left hip   . Depression   . Dysrhythmia    diagnosed pvc 1980  . Esophageal reflux   . Fibromyalgia   . Hearing loss   . Hyperlipemia   . Hyperlipidemia   . Hypertension   . Hypothyroidism   . Multiple sclerosis (HCC)   . Osteoarthritis   . PVC (premature ventricular contraction)   . Sciatica   . Tinnitus   . Uterine prolapse     Past Surgical History:  Procedure Laterality Date  . BREAST BIOPSY  05/24/2002  . BREAST BIOPSY Left 05/30/2016   negative  . CATARACT EXTRACTION, BILATERAL    . CHOLECYSTECTOMY N/A 03/24/2019   Procedure: LAPAROSCOPIC CHOLECYSTECTOMY;  Surgeon: Berna Bue, MD;  Location: [facility];  Service: General;  Laterality: N/A;  . PARTIAL HYSTERECTOMY    . surgical mesh     of uterine  . vaginal hemorrhoid surgery        reports that she has never smoked. She has never used smokeless tobacco. She reports that she does not drink alcohol and does not use drugs.  Allergies  Allergen Reactions  . Pizza Flavor Nausea And Vomiting  .  Penicillins Rash    Family History  Problem Relation Age of Onset  . Fibromyalgia Mother   . Anxiety disorder Mother   . Heart attack Father 70  . CAD Father   . Heart attack Cousin      Prior to Admission medications   Medication Sig Start Date End Date Taking? Authorizing Provider  ALPRAZolam Prudy Feeler) 0.25 MG tablet Take 0.25 mg by mouth at bedtime as needed for anxiety.   Yes [provider]  amitriptyline (ELAVIL) 50 MG tablet Take 25 mg by mouth at bedtime.   Yes [provider]  aspirin-acetaminophen-caffeine (EXCEDRIN MIGRAINE) [id number] MG tablet Take 1 tablet by mouth every 6 (six) hours as needed for headache.   Yes [provider]  buPROPion (WELLBUTRIN XL) 300 MG 24 hr tablet Take 1 tablet (300 mg total) by mouth daily. 11/03/19  Yes Sater, Pearletha Furl, MD  DULoxetine (CYMBALTA) 60 MG capsule Take 60 mg by mouth daily.   Yes [provider]  levothyroxine (SYNTHROID, LEVOTHROID) 50 MCG tablet Take 50 mcg by mouth daily before breakfast.   Yes [provider]  modafinil (PROVIGIL) 100 MG tablet Take 1 tablet (100 mg total) by mouth daily. 12/13/19  Yes Sater, Pearletha Furl, MD  naproxen sodium (ALEVE) 220 MG tablet Take 220 mg by mouth daily as needed.   Yes [provider]  pantoprazole (PROTONIX) 40 MG tablet Take 40 mg by mouth daily.   Yes [provider]  predniSONE (DELTASONE) 10 MG tablet Take 10 mg by mouth taper from 4 doses each day to 1 dose and stop. Take by mouth decreasing by 1 tablet daily until gone 02/13/20  Yes [provider]  pregabalin (LYRICA) 100 MG capsule Take 100 mg by mouth 2 (two) times daily.    Yes [provider]  propranolol (INDERAL) 10 MG tablet Take 1 tablet (10 mg total) by mouth 2 (two) times daily. 12/13/19  Yes Tonny Bollman, MD  traMADol (ULTRAM) 50 MG tablet Take 1 tablet (50 mg total) by mouth every 6 (six) hours as needed. For pain not relieved by tylenol or  ibuprofen 03/24/19 03/23/20 Yes Berna Bue, MD  methocarbamol (ROBAXIN) 500 MG tablet Take 1 tablet (500 mg total) by mouth 2 (two) times daily as needed for muscle spasms. Patient not taking: No sig reported 10/19/19   Asa Lente, MD    Physical Exam: Vitals:   02/19/2020 1007 03/07/2020 1120 02/26/2020 1130 02/16/2020 1200  BP: 139/75 126/67 122/66 135/79  Pulse: 78 71 71 71  Resp: (!) 26 17 17  (!) 21  Temp: 98.8 F (37.1 C)     TempSrc: Oral     SpO2: 93% 96% 99% 99%     Vitals:   02/26/2020 1007 02/14/2020 1120 03/05/2020 1130 02/14/2020 1200  BP: 139/75 126/67 122/66 135/79  Pulse: 78 71 71 71  Resp: (!) 26 17 17  (!) 21  Temp: 98.8 F (37.1 C)     TempSrc: Oral     SpO2: 93% 96% 99% 99%  Constitutional: NAD, alert and oriented x 3.  Acutely ill-appearing Eyes: PERRL, lids and conjunctivae pallor ENMT: Mucous membranes are moist.  Neck: normal, supple, no masses, no thyromegaly Respiratory: Bilateral air entry, no wheezing, no crackles.  Tachypneic. No accessory muscle use.  Cardiovascular: Regular rate and rhythm, no murmurs / rubs / gallops. No extremity edema. 2+ pedal pulses. No carotid bruits.  Abdomen: no tenderness, no masses palpated. No hepatosplenomegaly. Bowel sounds positive.  Musculoskeletal: no clubbing / cyanosis. No joint deformity upper and lower extremities.  Skin: no rashes, lesions, ulcers.  Neurologic: No gross focal neurologic deficit. Psychiatric: Normal mood and affect.   Labs on Admission: I have personally reviewed following labs and imaging studies  CBC: Recent Labs  Lab 02/25/2020 1015  WBC 14.7*  NEUTROABS 13.5*  HGB 10.3*  HCT 32.8*  MCV 76.5*  PLT 395   Basic Metabolic Panel: Recent Labs  Lab 03/06/2020 1015  NA 140  K 3.3*  CL 100  CO2 26  GLUCOSE 85  BUN 15  CREATININE 0.79  CALCIUM 8.4*   GFR: CrCl cannot be calculated (Unknown ideal weight.). Liver Function Tests: Recent Labs  Lab 02/14/2020 1015  AST 41  ALT 12   ALKPHOS 187*  BILITOT 0.4  PROT 6.9  ALBUMIN 2.6*   No results for input(s): LIPASE, AMYLASE in the last 168 hours. No results for input(s): AMMONIA in the last 168 hours. Coagulation Profile: No results for input(s): INR, PROTIME in the last 168 hours. Cardiac Enzymes: No results for input(s): CKTOTAL, CKMB, CKMBINDEX, TROPONINI in the last 168 hours. BNP (last 3 results) No results for input(s): PROBNP in the last 8760 hours. HbA1C: No results for input(s): HGBA1C in the last 72 hours. CBG: No results for input(s): GLUCAP in the last 168 hours. Lipid Profile: No results for input(s): CHOL, HDL, LDLCALC, TRIG, CHOLHDL, LDLDIRECT in the last 72 hours. Thyroid Function Tests: No results for input(s): TSH, T4TOTAL, FREET4, T3FREE, THYROIDAB in the last 72 hours. Anemia Panel: No results for input(s): VITAMINB12, FOLATE, FERRITIN, TIBC, IRON, RETICCTPCT in the last 72 hours. Urine analysis: No results found for: COLORURINE, APPEARANCEUR, LABSPEC, PHURINE, GLUCOSEU, HGBUR, BILIRUBINUR, KETONESUR, PROTEINUR, UROBILINOGEN, NITRITE, LEUKOCYTESUR  Radiological Exams on Admission: CT Angio Chest PE W and/or Wo Contrast  Result Date: 02/26/2020 CLINICAL DATA:  Dyspnea, cough, COVID pneumonia EXAM: CT ANGIOGRAPHY CHEST WITH CONTRAST TECHNIQUE: Multidetector CT imaging of the chest was performed using the standard protocol during bolus administration of intravenous contrast. Multiplanar CT image reconstructions and MIPs were obtained to evaluate the vascular anatomy. CONTRAST:  63mL OMNIPAQUE IOHEXOL 350 MG/ML SOLN COMPARISON:  None. FINDINGS: Cardiovascular: There is adequate opacification of the pulmonary arterial tree. No intraluminal filling defect identified to suggest acute pulmonary embolism. Central pulmonary arteries are of normal caliber. Minimal coronary artery calcification. Global cardiac size within normal limits. No pericardial effusion. Minimal atherosclerotic plaque within the  thoracic aorta. Mediastinum/Nodes: The thyroid is unremarkable. No pathologic thoracic adenopathy. Small hiatal hernia. Esophagus unremarkable. Lungs/Pleura: There is extensive diffuse multifocal ground-glass pulmonary infiltrate in keeping with changes of atypical infection and compatible with those seen with COVID-19 pneumonia. There is extensive parenchymal involvement. Pulmonary insufflation is normal and symmetric. No pneumothorax or pleural effusion. Central airways are widely patent. Upper Abdomen: No acute abnormality. Musculoskeletal: No acute bone abnormality. No lytic or blastic bone lesions. Review of the MIP images confirms the above findings. IMPRESSION: Extensive multifocal pulmonary infiltrates, likely infectious and compatible with those seen in COVID-19 pneumonia. Extensive parenchymal involvement.  No pulmonary embolism. Minimal coronary artery calcification. Aortic Atherosclerosis (ICD10-I70.0). Electronically Signed   By: Helyn Numbers MD   On: 03-01-20 12:40   DG Chest Portable 1 View  Result Date: 03-01-2020 CLINICAL DATA:  Hypoxia, COVID, cough, dyspnea EXAM: PORTABLE CHEST 1 VIEW COMPARISON:  04/03/2015 chest radiograph. FINDINGS: Stable cardiomediastinal silhouette with normal heart size. No pneumothorax. No pleural effusion. Extensive patchy opacities throughout both lungs. IMPRESSION: Extensive patchy opacities throughout both lungs, compatible with COVID-19 pneumonia. Electronically Signed   By: Delbert Phenix M.D.   On: 03-01-2020 10:33    EKG: Independently reviewed.  Normal sinus rhythm  Assessment/Plan Principal Problem:   Pneumonia due to COVID-19 virus Active Problems:   Essential hypertension   Depression with anxiety   Acute respiratory failure due to COVID-19 [facility])      Pneumonia due to COVID-19 virus with acute respiratory failure Patient presents for evaluation of shortness of breath and a cough and was noted to be hypoxic in the field requiring oxygen  supplementation. She is tachypneic with increased work of breathing and is currently on 4 L of oxygen with pulse oximetry of about 94% Chest x-ray shows extensive multifocal infiltrates consistent with Covid pneumonia Patient had a  positive COVID-19 home test on 02/04/20 She received 2 doses of the COVID-19 vaccine as well as a booster shot Place patient on Solu-Medrol 0.5mg /kg q 12 Place patient on remdesivir per protocol Monitor serial inflammatory markers Positive care with antitussives and bronchodilators     Hypothyroidism Continue Synthroid    Anxiety and depression Continue alprazolam, amitriptyline, bupropion and Cymbalta    GERD Continue Protonix    Hypokalemia Supplement potassium    DVT prophylaxis: Lovenox Code Status: Full code Family Communication: Greater than 50% of time was spent discussing patient's condition and plan of care with her at the bedside.  She verbalizes understanding and agrees with the plan. Disposition Plan: Back to previous home environment Consults called: None    Jabarie Pop MD [facility]     03/01/20, 1:35 PM

## 2020-02-15 NOTE — ED Notes (Signed)
Ambulated patient to restroom.

## 2020-02-15 NOTE — ED Notes (Signed)
Hospitalist at bedside 

## 2020-02-15 NOTE — Consult Note (Signed)
Remdesivir - Pharmacy Brief Note   O:  ALT: 12 CXR: "Extensive patchy opacities throughout both lungs, compatible with COVID-19 pneumonia" SpO2: Hypoxic requiring supplemental oxygen   A/P:  Per chart review, patient coming in known COVID (+). Inpatient testing not available at this time.  Remdesivir 200 mg IVPB once followed by 100 mg IVPB daily x 4 days.   Tressie Ellis 02/28/2020 11:31 AM

## 2020-02-16 DIAGNOSIS — U071 COVID-19: Secondary | ICD-10-CM | POA: Diagnosis not present

## 2020-02-16 DIAGNOSIS — F418 Other specified anxiety disorders: Secondary | ICD-10-CM | POA: Diagnosis not present

## 2020-02-16 DIAGNOSIS — J1282 Pneumonia due to coronavirus disease 2019: Secondary | ICD-10-CM | POA: Diagnosis not present

## 2020-02-16 DIAGNOSIS — J9600 Acute respiratory failure, unspecified whether with hypoxia or hypercapnia: Secondary | ICD-10-CM | POA: Diagnosis not present

## 2020-02-16 LAB — COMPREHENSIVE METABOLIC PANEL
ALT: 13 U/L (ref 0–44)
AST: 39 U/L (ref 15–41)
Albumin: 2.1 g/dL — ABNORMAL LOW (ref 3.5–5.0)
Alkaline Phosphatase: 154 U/L — ABNORMAL HIGH (ref 38–126)
Anion gap: 12 (ref 5–15)
BUN: 14 mg/dL (ref 8–23)
CO2: 24 mmol/L (ref 22–32)
Calcium: 7.9 mg/dL — ABNORMAL LOW (ref 8.9–10.3)
Chloride: 106 mmol/L (ref 98–111)
Creatinine, Ser: 0.68 mg/dL (ref 0.44–1.00)
GFR, Estimated: >60 mL/min (ref >60)
Glucose, Bld: 121 mg/dL — ABNORMAL HIGH (ref 70–99)
Potassium: 4.2 mmol/L (ref 3.5–5.1)
Sodium: 142 mmol/L (ref 135–145)
Total Bilirubin: 0.3 mg/dL (ref 0.3–1.2)
Total Protein: 6.1 g/dL — ABNORMAL LOW (ref 6.5–8.1)

## 2020-02-16 LAB — CBC WITH DIFFERENTIAL/PLATELET
Abs Immature Granulocytes: 0.05 10*3/uL (ref 0.00–0.07)
Basophils Absolute: 0 10*3/uL (ref 0.0–0.1)
Basophils Relative: 0 %
Eosinophils Absolute: 0 10*3/uL (ref 0.0–0.5)
Eosinophils Relative: 0 %
HCT: 27.7 % — ABNORMAL LOW (ref 36.0–46.0)
Hemoglobin: 8.9 g/dL — ABNORMAL LOW (ref 12.0–15.0)
Immature Granulocytes: 1 %
Lymphocytes Relative: 2 %
Lymphs Abs: 0.2 10*3/uL — ABNORMAL LOW (ref 0.7–4.0)
MCH: 25.1 pg — ABNORMAL LOW (ref 26.0–34.0)
MCHC: 32.1 g/dL (ref 30.0–36.0)
MCV: 78.2 fL — ABNORMAL LOW (ref 80.0–100.0)
Monocytes Absolute: 0.3 10*3/uL (ref 0.1–1.0)
Monocytes Relative: 3 %
Neutro Abs: 10.2 10*3/uL — ABNORMAL HIGH (ref 1.7–7.7)
Neutrophils Relative %: 94 %
Platelets: 406 10*3/uL — ABNORMAL HIGH (ref 150–400)
RBC: 3.54 MIL/uL — ABNORMAL LOW (ref 3.87–5.11)
RDW: 17.4 % — ABNORMAL HIGH (ref 11.5–15.5)
WBC: 10.8 10*3/uL — ABNORMAL HIGH (ref 4.0–10.5)
nRBC: 0 % (ref 0.0–0.2)

## 2020-02-16 LAB — C-REACTIVE PROTEIN
CRP: 17.1 mg/dL — ABNORMAL HIGH (ref <1.0)
CRP: 15.1 mg/dL — ABNORMAL HIGH (ref <1.0)
CRP: 13.9 mg/dL — ABNORMAL HIGH (ref <1.0)

## 2020-02-16 LAB — HEMOGLOBIN: Hemoglobin: 9.7 g/dL — ABNORMAL LOW (ref 12.0–15.0)

## 2020-02-16 LAB — FERRITIN
Ferritin: 132 ng/mL (ref 11–307)
Ferritin: 128 ng/mL (ref 11–307)

## 2020-02-16 LAB — FIBRIN DERIVATIVES D-DIMER (ARMC ONLY): Fibrin derivatives D-dimer (ARMC): 1249.31 ng/mL (FEU) — ABNORMAL HIGH (ref 0.00–499.00)

## 2020-02-16 LAB — PHOSPHORUS: Phosphorus: 3.3 mg/dL (ref 2.5–4.6)

## 2020-02-16 LAB — HIV ANTIBODY (ROUTINE TESTING W REFLEX): HIV Screen 4th Generation wRfx: NONREACTIVE

## 2020-02-16 LAB — SARS CORONAVIRUS 2 (TAT 6-24 HRS): SARS Coronavirus 2: POSITIVE — AB

## 2020-02-16 LAB — MAGNESIUM: Magnesium: 2 mg/dL (ref 1.7–2.4)

## 2020-02-16 MED ORDER — BARICITINIB 2 MG PO TABS
4.0000 mg | ORAL_TABLET | Freq: Every day | ORAL | Status: DC
  Administered 2020-02-17 – 2020-02-18 (×2): 4 mg via ORAL
  Filled 2020-02-16 (×2): qty 2

## 2020-02-16 MED ORDER — MODAFINIL 100 MG PO TABS
100.0000 mg | ORAL_TABLET | Freq: Every day | ORAL | Status: DC
  Administered 2020-02-17: 100 mg via ORAL
  Filled 2020-02-16: qty 1

## 2020-02-16 MED ORDER — GUAIFENESIN ER 600 MG PO TB12
1200.0000 mg | ORAL_TABLET | Freq: Two times a day (BID) | ORAL | Status: DC
  Administered 2020-02-16 – 2020-02-18 (×4): 1200 mg via ORAL
  Filled 2020-02-16 (×5): qty 2

## 2020-02-16 MED ORDER — SODIUM CHLORIDE 0.9 % IV SOLN
200.0000 mg | Freq: Once | INTRAVENOUS | Status: AC
  Administered 2020-02-16: 14:00:00 200 mg via INTRAVENOUS
  Filled 2020-02-16: qty 200

## 2020-02-16 MED ORDER — SODIUM CHLORIDE 0.9 % IV SOLN
100.0000 mg | Freq: Every day | INTRAVENOUS | Status: AC
  Administered 2020-02-17 – 2020-02-20 (×4): 100 mg via INTRAVENOUS
  Filled 2020-02-16 (×4): qty 100

## 2020-02-16 MED ORDER — FUROSEMIDE 10 MG/ML IJ SOLN
40.0000 mg | Freq: Once | INTRAMUSCULAR | Status: AC
  Administered 2020-02-16: 14:00:00 40 mg via INTRAVENOUS
  Filled 2020-02-16: qty 4

## 2020-02-16 MED ORDER — BARICITINIB 2 MG PO TABS
2.0000 mg | ORAL_TABLET | Freq: Every day | ORAL | Status: DC
  Administered 2020-02-16: 2 mg via ORAL
  Filled 2020-02-16: qty 1

## 2020-02-16 MED ORDER — MENTHOL 3 MG MT LOZG
1.0000 | LOZENGE | OROMUCOSAL | Status: DC | PRN
  Administered 2020-02-16: 10:00:00 3 mg via ORAL
  Filled 2020-02-16: qty 9

## 2020-02-16 NOTE — Progress Notes (Signed)
Pt spouse called in. Updated on pt status. All questions addressed at time of call.

## 2020-02-16 NOTE — Progress Notes (Signed)
PHARMACY NOTE:  ANTIMICROBIAL RENAL DOSAGE ADJUSTMENT  Current antimicrobial regimen includes a mismatch between antimicrobial dosage and estimated renal function.  As per policy approved by the Pharmacy & Therapeutics and Medical Executive Committees, the antimicrobial dosage will be adjusted accordingly.  Current antimicrobial dosage:  baricitinib 54m  Indication: COVID PNA  Renal Function: eGFR > 60 ml/min  Estimated Creatinine Clearance: 73.8 mL/min (by C-G formula based on SCr of 0.68 mg/dL). _0      On intermittent HD, scheduled: _1      On CRRT    Antimicrobial dosage has been changed to:  baricitinib 467m Additional comments:   Thank you for allowing pharmacy to be a part of this patient's care.  ChLu DuffelPharmD, BCPS Clinical Pharmacist 02/16/2020 10:32 AM

## 2020-02-16 NOTE — Plan of Care (Signed)
Rec'd call from CCMD that pt had desatted into the 50's. Went into the room and she had [place] in place.  Turned the HFNC to 15L and asked her to take some deep breaths.  She didn't recover.Placed her on NRB on 15L.  Called respiratory and called rapid response.  Pt is also having AMS.  She's mad and won't keep NRB on.  Called husband and asked him to come to the hospital.  If we can't get her to stabilize, she will have to be moved to stepdown.

## 2020-02-16 NOTE — Progress Notes (Signed)
[facility]  PROGRESS NOTE  Summer Holloway YIF:[id number] DOB: 1950/11/02 DOA: 02/19/20 PCP: Marden Noble, MD   Brief HPI:   70 year old female with medical history of fibromyalgia, osteoarthritis, hypertension, hypothyroidism, GERD was brought to ED for shortness of breath and cough.  Patient has been sick for past 2 weeks and had positive COVID-19 test at home on 02/02/2020.  Per EMS patient was hypoxic in the field and was placed on nonrebreather mask at 15 L/min. In the ED chest x-ray reviewed showed patchy opacities throughout both lung fields compatible with COVID-19 pneumonia.  CTA chest showed extensive pulmonary infiltrates likely infectious.  No PE.   Subjective   Patient seen and examined, talking in whispers, she is coughing up green-colored phlegm.   Assessment/Plan:    1. Acute hypoxemic respiratory failure-due to COVID-19 pneumonia, patient presented with worsening shortness of breath, chest x-ray shows multifocal infiltrates.  CTA negative for PE but showed multifocal infiltrates bilaterally.  Patient started on remdesivir, Solu-Medrol, baricitinib.  CRP is down to 13.9.  Will continue above treatment, also had Mucinex 1200 mg p.o. twice daily, will give 1 dose of Lasix 40 mg IV.  Follow CRP in AM.  Continue incentive spirometry, flutter valve.  Encourage prone position as tolerated. 2. Hypothyroidism-continue Synthroid 3. Anxiety/depression-continue potassium, amitriptyline, bupropion, Cymbalta. 4. GERD-continue Protonix   COVID-19 Labs  Recent Labs    19-Feb-2020 2138 2020-02-19 2312 02/16/20 0443  FERRITIN  --  132 128  CRP 17.1* 15.1* 13.9*    Lab Results  Component Value Date   SARSCOV2NAA POSITIVE (A) 19-Feb-2020   SARSCOV2NAA NEGATIVE 03/22/2019     Scheduled medications:   . albuterol  2 puff Inhalation Q6H  . amitriptyline  25 mg Oral QHS  . vitamin C  500 mg Oral Daily  . [START ON 02/17/2020] baricitinib  4 mg Oral Daily  . buPROPion  300  mg Oral Daily  . DULoxetine  60 mg Oral Daily  . enoxaparin (LOVENOX) injection  40 mg Subcutaneous Q24H  . levothyroxine  50 mcg Oral Q0600  . methylPREDNISolone (SOLU-MEDROL) injection  0.5 mg/kg Intravenous Q12H   Followed by  . [START ON 02/19/2020] predniSONE  50 mg Oral Daily  . modafinil  100 mg Oral Daily  . pantoprazole  40 mg Oral Daily  . pregabalin  100 mg Oral BID  . propranolol  10 mg Oral BID  . sodium chloride flush  3 mL Intravenous Q12H  . zinc sulfate  220 mg Oral Daily     CBG: No results for input(s): GLUCAP in the last 168 hours.  SpO2: 92 % O2 Flow Rate (L/min): 4 L/min    CBC: Recent Labs  Lab 2020-02-19 1015 02/16/20 0443  WBC 14.7* 10.8*  NEUTROABS 13.5* 10.2*  HGB 10.3* 8.9*  HCT 32.8* 27.7*  MCV 76.5* 78.2*  PLT 395 406*    Basic Metabolic Panel: Recent Labs  Lab Feb 19, 2020 1015 02/16/20 0443  NA 140 142  K 3.3* 4.2  CL 100 106  CO2 26 24  GLUCOSE 85 121*  BUN 15 14  CREATININE 0.79 0.68  CALCIUM 8.4* 7.9*  MG  --  2.0  PHOS  --  3.3     Liver Function Tests: Recent Labs  Lab Feb 19, 2020 1015 02/16/20 0443  AST 41 39  ALT 12 13  ALKPHOS 187* 154*  BILITOT 0.4 0.3  PROT 6.9 6.1*  ALBUMIN 2.6* 2.1*     Antibiotics: Anti-infectives (From admission, onward)   Start  Dose/Rate Route Frequency Ordered Stop   02/16/20 1000  remdesivir 100 mg in sodium chloride 0.9 % 100 mL IVPB  Status:  Discontinued       "Followed by" Linked Group Details   100 mg 200 mL/hr over 30 Minutes Intravenous Daily 02-18-2020 1135 02-18-20 2102   02/16/20 1000  remdesivir 100 mg in sodium chloride 0.9 % 100 mL IVPB  Status:  Discontinued       "Followed by" Linked Group Details   100 mg 200 mL/hr over 30 Minutes Intravenous Daily Feb 18, 2020 1343 02/18/2020 1352   2020/02/18 2200  levofloxacin (LEVAQUIN) IVPB 750 mg  Status:  Discontinued        750 mg 100 mL/hr over 90 Minutes Intravenous Every 24 hours 18-Feb-2020 2107 02-18-2020 2117   2020-02-18 2200   cefTRIAXone (ROCEPHIN) 1 g in sodium chloride 0.9 % 100 mL IVPB  Status:  Discontinued        1 g 200 mL/hr over 30 Minutes Intravenous Every 24 hours 2020/02/18 2117 02/16/20 0231   18-Feb-2020 2130  azithromycin (ZITHROMAX) 500 mg in sodium chloride 0.9 % 250 mL IVPB  Status:  Discontinued        500 mg 250 mL/hr over 60 Minutes Intravenous Every 24 hours 02/18/2020 2117 02/16/20 0231   02-18-2020 1415  remdesivir 200 mg in sodium chloride 0.9% 250 mL IVPB  Status:  Discontinued       "Followed by" Linked Group Details   200 mg 580 mL/hr over 30 Minutes Intravenous Once 02-18-20 1343 18-Feb-2020 1352   02-18-20 1300  remdesivir 200 mg in sodium chloride 0.9% 250 mL IVPB  Status:  Discontinued       "Followed by" Linked Group Details   200 mg 580 mL/hr over 30 Minutes Intravenous Once 02-18-2020 1135 02-18-20 2102       DVT prophylaxis: Lovenox  Code Status: Full code  Family Communication: No family at bedside   Consultants:  Procedures:      Objective   Vitals:   02/16/20 0200 02/16/20 0321 02/16/20 0408 02/16/20 0723  BP: 126/73 (!) 148/73  140/70  Pulse: (!) 57 70  65  Resp: 18 16  18   Temp:  (!) 97.4 F (36.3 C)  97.7 F (36.5 C)  TempSrc:  Oral    SpO2: 100% 91% (!) 4% 92%  Weight:        Intake/Output Summary (Last 24 hours) at 02/16/2020 1118 Last data filed at 02/16/2020 1840 Gross per 24 hour  Intake 449 ml  Output --  Net 449 ml    02/01 1901 - 02/03 0700 In: 449  Out: -   Filed Weights   February 18, 2020 1500  Weight: 86.1 kg    Physical Examination:    General-appears in no acute distress  Heart-S1-S2, regular, no murmur auscultated  Lungs-bilateral crackles auscultated, no tachypnea  Abdomen-soft, nontender, no organomegaly  Extremities-no edema in the lower extremities  Neuro-alert, oriented x3, no focal deficit noted   Status is: Inpatient  Dispo: The patient is from: Home              Anticipated d/c is to: Home              Anticipated  d/c date is: 02/18/2020              Patient currently not stable for discharge  Barrier to discharge-acute hypoxemic respiratory failure due to COVID-19 pneumonia            Data  Reviewed:   Recent Results (from the past 240 hour(s))  SARS CORONAVIRUS 2 (TAT 6-24 HRS)     Status: Abnormal   Collection Time: 02/20/2020  9:57 AM  Result Value Ref Range Status   SARS Coronavirus 2 POSITIVE (A) NEGATIVE Final    Comment: (NOTE) SARS-CoV-2 target nucleic acids are DETECTED.  The SARS-CoV-2 RNA is generally detectable in upper and lower respiratory specimens during the acute phase of infection. Positive results are indicative of the presence of SARS-CoV-2 RNA. Clinical correlation with patient history and other diagnostic information is  necessary to determine patient infection status. Positive results do not rule out bacterial infection or co-infection with other viruses.  The expected result is Negative.  Fact Sheet for Patients: HairSlick.no  Fact Sheet for Healthcare Providers: quierodirigir.com  This test is not yet approved or cleared by the [place] FDA and  has been authorized for detection and/or diagnosis of SARS-CoV-2 by FDA under an Emergency Use Authorization (EUA). This EUA will remain  in effect (meaning this test can be used) for the duration of the COVID-19 declaration under Section 564(b)(1) of the Act, 21 U. S.C. section 360bbb-3(b)(1), unless the authorization is terminated or revoked sooner.   Performed at [facility], [address] [place], [place] [address]   Blood culture (single)     Status: None (Preliminary result)   Collection Time: 02/14/2020 10:15 AM   Specimen: BLOOD  Result Value Ref Range Status   Specimen Description BLOOD LEFT AC  Final   Special Requests   Final    BOTTLES DRAWN AEROBIC AND ANAEROBIC Blood Culture results may not be optimal due to an inadequate volume of  blood received in culture bottles   Culture   Final    NO GROWTH < 24 HOURS Performed at [facility], [address] [place], [place] [address]    Report Status PENDING  Incomplete  Culture, blood (routine x 2) Call MD if unable to obtain prior to antibiotics being given     Status: None (Preliminary result)   Collection Time: 03/12/2020  9:38 PM   Specimen: BLOOD  Result Value Ref Range Status   Specimen Description BLOOD LEFT ANTECUBITAL  Final   Special Requests   Final    BOTTLES DRAWN AEROBIC AND ANAEROBIC Blood Culture results may not be optimal due to an inadequate volume of blood received in culture bottles   Culture   Final    NO GROWTH < 12 HOURS Performed at [facility], [address], [place], [place] [address]    Report Status PENDING  Incomplete  Culture, blood (routine x 2) Call MD if unable to obtain prior to antibiotics being given     Status: None (Preliminary result)   Collection Time: 03/04/2020  9:38 PM   Specimen: BLOOD  Result Value Ref Range Status   Specimen Description BLOOD BLOOD RIGHT FOREARM  Final   Special Requests   Final    BOTTLES DRAWN AEROBIC AND ANAEROBIC Blood Culture adequate volume   Culture   Final    NO GROWTH < 12 HOURS Performed at [facility], [address] [place], [place] [address]    Report Status PENDING  Incomplete    BNP (last 3 results) Recent Labs    03/11/2020 1010  BNP 103.7*    Studies:  CT Angio Chest PE W and/or Wo Contrast  Result Date: 02/19/2020 CLINICAL DATA:  Dyspnea, cough, COVID pneumonia EXAM: CT ANGIOGRAPHY CHEST WITH  CONTRAST TECHNIQUE: Multidetector CT imaging of the chest was performed using the standard protocol during bolus administration of intravenous contrast. Multiplanar CT image reconstructions and MIPs were obtained to evaluate the vascular anatomy. CONTRAST:  23mL OMNIPAQUE IOHEXOL 350 MG/ML SOLN COMPARISON:  None. FINDINGS: Cardiovascular: There is adequate  opacification of the pulmonary arterial tree. No intraluminal filling defect identified to suggest acute pulmonary embolism. Central pulmonary arteries are of normal caliber. Minimal coronary artery calcification. Global cardiac size within normal limits. No pericardial effusion. Minimal atherosclerotic plaque within the thoracic aorta. Mediastinum/Nodes: The thyroid is unremarkable. No pathologic thoracic adenopathy. Small hiatal hernia. Esophagus unremarkable. Lungs/Pleura: There is extensive diffuse multifocal ground-glass pulmonary infiltrate in keeping with changes of atypical infection and compatible with those seen with COVID-19 pneumonia. There is extensive parenchymal involvement. Pulmonary insufflation is normal and symmetric. No pneumothorax or pleural effusion. Central airways are widely patent. Upper Abdomen: No acute abnormality. Musculoskeletal: No acute bone abnormality. No lytic or blastic bone lesions. Review of the MIP images confirms the above findings. IMPRESSION: Extensive multifocal pulmonary infiltrates, likely infectious and compatible with those seen in COVID-19 pneumonia. Extensive parenchymal involvement. No pulmonary embolism. Minimal coronary artery calcification. Aortic Atherosclerosis (ICD10-I70.0). Electronically Signed   By: Helyn Numbers MD   On: 02/20/2020 12:40   DG Chest Portable 1 View  Result Date: 02/28/2020 CLINICAL DATA:  Hypoxia, COVID, cough, dyspnea EXAM: PORTABLE CHEST 1 VIEW COMPARISON:  04/03/2015 chest radiograph. FINDINGS: Stable cardiomediastinal silhouette with normal heart size. No pneumothorax. No pleural effusion. Extensive patchy opacities throughout both lungs. IMPRESSION: Extensive patchy opacities throughout both lungs, compatible with COVID-19 pneumonia. Electronically Signed   By: Delbert Phenix M.D.   On: 02/19/2020 10:33       Gaston Dase S Mileah Hemmer   [facility] If 7PM-7AM, please contact night-coverage at [email or web address], Office   [phone]   02/16/2020, 11:18 AM  LOS: 1 day

## 2020-02-17 ENCOUNTER — Inpatient Hospital Stay: Payer: Medicare PPO

## 2020-02-17 DIAGNOSIS — F418 Other specified anxiety disorders: Secondary | ICD-10-CM | POA: Diagnosis not present

## 2020-02-17 DIAGNOSIS — U071 COVID-19: Secondary | ICD-10-CM | POA: Diagnosis not present

## 2020-02-17 DIAGNOSIS — J9600 Acute respiratory failure, unspecified whether with hypoxia or hypercapnia: Secondary | ICD-10-CM | POA: Diagnosis not present

## 2020-02-17 DIAGNOSIS — J1282 Pneumonia due to coronavirus disease 2019: Secondary | ICD-10-CM | POA: Diagnosis not present

## 2020-02-17 LAB — BLOOD GAS, ARTERIAL
Acid-Base Excess: 6 mmol/L — ABNORMAL HIGH (ref 0.0–2.0)
Bicarbonate: 29.6 mmol/L — ABNORMAL HIGH (ref 20.0–28.0)
O2 Saturation: 83.4 %
Patient temperature: 37
pCO2 arterial: 38 mmHg (ref 32.0–48.0)
pH, Arterial: 7.5 — ABNORMAL HIGH (ref 7.350–7.450)
pO2, Arterial: 43 mmHg — ABNORMAL LOW (ref 83.0–108.0)

## 2020-02-17 LAB — COMPREHENSIVE METABOLIC PANEL
ALT: 15 U/L (ref 0–44)
AST: 45 U/L — ABNORMAL HIGH (ref 15–41)
Albumin: 2.6 g/dL — ABNORMAL LOW (ref 3.5–5.0)
Alkaline Phosphatase: 153 U/L — ABNORMAL HIGH (ref 38–126)
Anion gap: 13 (ref 5–15)
BUN: 23 mg/dL (ref 8–23)
CO2: 26 mmol/L (ref 22–32)
Calcium: 8.3 mg/dL — ABNORMAL LOW (ref 8.9–10.3)
Chloride: 103 mmol/L (ref 98–111)
Creatinine, Ser: 0.81 mg/dL (ref 0.44–1.00)
GFR, Estimated: >60 mL/min (ref >60)
Glucose, Bld: 76 mg/dL (ref 70–99)
Potassium: 4 mmol/L (ref 3.5–5.1)
Sodium: 142 mmol/L (ref 135–145)
Total Bilirubin: 0.5 mg/dL (ref 0.3–1.2)
Total Protein: 6.7 g/dL (ref 6.5–8.1)

## 2020-02-17 LAB — MAGNESIUM: Magnesium: 2 mg/dL (ref 1.7–2.4)

## 2020-02-17 LAB — CBC WITH DIFFERENTIAL/PLATELET
Abs Immature Granulocytes: 0.11 10*3/uL — ABNORMAL HIGH (ref 0.00–0.07)
Basophils Absolute: 0 10*3/uL (ref 0.0–0.1)
Basophils Relative: 0 %
Eosinophils Absolute: 0 10*3/uL (ref 0.0–0.5)
Eosinophils Relative: 0 %
HCT: 31.6 % — ABNORMAL LOW (ref 36.0–46.0)
Hemoglobin: 9.6 g/dL — ABNORMAL LOW (ref 12.0–15.0)
Immature Granulocytes: 1 %
Lymphocytes Relative: 2 %
Lymphs Abs: 0.3 10*3/uL — ABNORMAL LOW (ref 0.7–4.0)
MCH: 24.8 pg — ABNORMAL LOW (ref 26.0–34.0)
MCHC: 30.4 g/dL (ref 30.0–36.0)
MCV: 81.7 fL (ref 80.0–100.0)
Monocytes Absolute: 0.5 10*3/uL (ref 0.1–1.0)
Monocytes Relative: 3 %
Neutro Abs: 18.9 10*3/uL — ABNORMAL HIGH (ref 1.7–7.7)
Neutrophils Relative %: 94 %
Platelets: 480 10*3/uL — ABNORMAL HIGH (ref 150–400)
RBC: 3.87 MIL/uL (ref 3.87–5.11)
RDW: 18.5 % — ABNORMAL HIGH (ref 11.5–15.5)
WBC: 19.8 10*3/uL — ABNORMAL HIGH (ref 4.0–10.5)
nRBC: 0 % (ref 0.0–0.2)

## 2020-02-17 LAB — GLUCOSE, CAPILLARY
Glucose-Capillary: 72 mg/dL (ref 70–99)
Glucose-Capillary: 131 mg/dL — ABNORMAL HIGH (ref 70–99)

## 2020-02-17 LAB — FIBRIN DERIVATIVES D-DIMER (ARMC ONLY): Fibrin derivatives D-dimer (ARMC): 1306.18 ng/mL (FEU) — ABNORMAL HIGH (ref 0.00–499.00)

## 2020-02-17 LAB — BLOOD GAS, VENOUS
Acid-base deficit: 3.2 mmol/L — ABNORMAL HIGH (ref 0.0–2.0)
Bicarbonate: 22 mmol/L (ref 20.0–28.0)
O2 Saturation: 86.2 %
Patient temperature: 37
pCO2, Ven: 39 mmHg — ABNORMAL LOW (ref 44.0–60.0)
pH, Ven: 7.36 (ref 7.250–7.430)
pO2, Ven: 54 mmHg — ABNORMAL HIGH (ref 32.0–45.0)

## 2020-02-17 LAB — FERRITIN: Ferritin: 114 ng/mL (ref 11–307)

## 2020-02-17 LAB — C-REACTIVE PROTEIN: CRP: 11.7 mg/dL — ABNORMAL HIGH (ref <1.0)

## 2020-02-17 LAB — PHOSPHORUS: Phosphorus: 3.9 mg/dL (ref 2.5–4.6)

## 2020-02-17 MED ORDER — ROCURONIUM BROMIDE 50 MG/5ML IV SOLN
150.0000 mg | Freq: Once | INTRAVENOUS | Status: AC
  Administered 2020-02-17: 150 mg via INTRAVENOUS

## 2020-02-17 MED ORDER — DEXMEDETOMIDINE HCL IN NACL 400 MCG/100ML IV SOLN
0.4000 ug/kg/h | INTRAVENOUS | Status: DC
  Administered 2020-02-17: 1 ug/kg/h via INTRAVENOUS
  Administered 2020-02-17: 0.9 ug/kg/h via INTRAVENOUS
  Administered 2020-02-17: 0.8 ug/kg/h via INTRAVENOUS
  Filled 2020-02-17 (×3): qty 100

## 2020-02-17 MED ORDER — FENTANYL 2500MCG IN NS 250ML (10MCG/ML) PREMIX INFUSION
0.0000 ug/h | INTRAVENOUS | Status: DC
  Administered 2020-02-17: 200 ug/h via INTRAVENOUS
  Administered 2020-02-17: 27 ug/h via INTRAVENOUS
  Administered 2020-02-18: 400 ug/h via INTRAVENOUS
  Administered 2020-02-18: 175 ug/h via INTRAVENOUS
  Administered 2020-02-19: 225 ug/h via INTRAVENOUS
  Administered 2020-02-19: 226 ug/h via INTRAVENOUS
  Administered 2020-02-20 – 2020-02-22 (×5): 225 ug/h via INTRAVENOUS
  Administered 2020-02-22 – 2020-02-26 (×7): 200 ug/h via INTRAVENOUS
  Administered 2020-02-27: 350 ug/h via INTRAVENOUS
  Administered 2020-02-27: 250 ug/h via INTRAVENOUS
  Administered 2020-02-27: 50 ug/h via INTRAVENOUS
  Administered 2020-02-28 – 2020-02-29 (×5): 350 ug/h via INTRAVENOUS
  Filled 2020-02-17 (×29): qty 250

## 2020-02-17 MED ORDER — ALPRAZOLAM 0.25 MG PO TABS
0.2500 mg | ORAL_TABLET | Freq: Once | ORAL | Status: AC
  Administered 2020-02-17: 05:00:00 0.25 mg via ORAL
  Filled 2020-02-17: qty 1

## 2020-02-17 MED ORDER — FENTANYL CITRATE (PF) 100 MCG/2ML IJ SOLN
INTRAMUSCULAR | Status: AC
  Filled 2020-02-17: qty 4

## 2020-02-17 MED ORDER — DEXAMETHASONE SODIUM PHOSPHATE 10 MG/ML IJ SOLN
6.0000 mg | INTRAMUSCULAR | Status: DC
  Administered 2020-02-17 – 2020-02-28 (×12): 6 mg via INTRAVENOUS
  Filled 2020-02-17 (×12): qty 0.6

## 2020-02-17 MED ORDER — LORAZEPAM 2 MG/ML IJ SOLN: 0.2500 mg | Freq: Once | INTRAMUSCULAR | Status: DC

## 2020-02-17 MED ORDER — FENTANYL CITRATE (PF) 100 MCG/2ML IJ SOLN
150.0000 ug | Freq: Once | INTRAMUSCULAR | Status: AC
Start: 2020-02-17 — End: 2020-02-17
  Administered 2020-02-17: 150 ug via INTRAVENOUS

## 2020-02-17 MED ORDER — LORAZEPAM 2 MG/ML IJ SOLN
0.2500 mg | Freq: Once | INTRAMUSCULAR | Status: AC | PRN
  Administered 2020-02-17: 0.25 mg via INTRAVENOUS
  Filled 2020-02-17: qty 1

## 2020-02-17 MED ORDER — ROCURONIUM BROMIDE 50 MG/5ML IV SOLN
INTRAVENOUS | Status: AC
  Administered 2020-02-17: 40 mg
  Filled 2020-02-17: qty 3

## 2020-02-17 MED ORDER — FUROSEMIDE 10 MG/ML IJ SOLN
40.0000 mg | Freq: Once | INTRAMUSCULAR | Status: AC
  Administered 2020-02-17: 40 mg via INTRAVENOUS
  Filled 2020-02-17: qty 4

## 2020-02-17 MED ORDER — CHLORHEXIDINE GLUCONATE CLOTH 2 % EX PADS
6.0000 | MEDICATED_PAD | Freq: Every day | CUTANEOUS | Status: DC
  Administered 2020-02-17 – 2020-02-28 (×12): 6 via TOPICAL

## 2020-02-17 NOTE — Progress Notes (Signed)
New orders place prior to reassessment of vital to include, ABGs, transfer of care portable x-ray. Pt transferred to step-down. Report  provided to receiving nurse all questions answered at time of report. Pt spouse, assisted to ICU waiting room.   02/17/20 0842  Assess: MEWS Score  Temp 97.9 F (36.6 C)  BP 126/67  Pulse Rate 72  Resp (!) 31  SpO2 (!) 81 %  Assess: MEWS Score  MEWS Temp 0  MEWS Systolic 0  MEWS Pulse 0  MEWS RR 2  MEWS LOC 0  MEWS Score 2  MEWS Score Color Yellow  Assess: if the MEWS score is Yellow or Red  Were vital signs taken at a resting state? Yes  Focused Assessment Change from prior assessment (see assessment flowsheet)  Early Detection of Sepsis Score *See Row Information* Low  MEWS guidelines implemented *See Row Information* Yes  Treat  MEWS Interventions Escalated (See documentation below)  Pain Scale 0-10  Pain Score 0  Take Vital Signs  Increase Vital Sign Frequency  Yellow: Q 2hr X 2 then Q 4hr X 2, if remains yellow, continue Q 4hrs  Escalate  MEWS: Escalate Yellow: discuss with charge nurse/RN and consider discussing with provider and RRT  Notify: Charge Nurse/RN  Name of Charge Nurse/RN Notified Alvan Dame RN  Date Charge Nurse/RN Notified 02/17/20  Time Charge Nurse/RN Notified 0845  Notify: Provider  Provider Name/Title Drusilla Kanner, MD (provider responded to bedside prior to assessment. Transfer to stepdown level of care previously placed)  Date Provider Notified 02/17/20  Time Provider Notified [phone] (MD at bedside to assess pt prior to vitals being reassessed pt transfer to higher level of care in place.)  Notification Type Face-to-face  Notification Reason Change in status  Response See new orders  Date of Provider Response 02/17/20  Time of Provider Response 0800  Document  Patient Outcome Transferred/level of care increased  Progress note created (see row info) Yes

## 2020-02-17 NOTE — Progress Notes (Addendum)
Rapid response PROGRESS NOTE    Summer Holloway  QZR:[id number] DOB: 19-Apr-1950 DOA: 02-22-20 PCP: Marden Noble, MD   Brief Narrative:  70 year old female admitted on 02/22/2020 with acute hypoxemic respiratory failure secondary to COVID-19 pneumonia  for which rapid response was called due to desaturation to the 50s while on maximum flow of HFNC and nonrebreather.  Patient had a CTA chest on admission but ruled out acute PE. Upon my arrival, patient was sitting upright in bed with rapid response team at bedside.  O2 sat on my arrival was 95 to 97%.  Patient was tachypneic, appeared anxious but was speaking in full sentences.  Patient was removing nonrebreather several times and had to be asked to place it back on.  Assessment & Plan:   Acute respiratory failure pneumonia due to COVID-19 virus -Patient's oxygenation maintained at 95 to 97% at rest but with exertion dipped as low as 88%.  She was still able to speak in full sentences, albeit with conversational dyspnea.  Had to be reminded several times to keep the nonrebreather mask on -We will keep patient on MedSurg for now but continue to monitor closely with low threshold for transfer to PCU or stepdown if desaturating with rest -Patient appeared to be anxious about going home and calling her husband and needed a lot of reassurance -Decided to give patient a mild anxiolytic of Ativan 0.25.  Has a history of depression and anxiety and takes Xanax at home -No additional orders for now.  CRITICAL CARE Performed by: Andris Baumann   Total critical care time:35 minutes  Critical care time was exclusive of separately billable procedures and treating other patients.  Critical care was necessary to treat or prevent imminent or life-threatening deterioration.  Critical care was time spent personally by me on the following activities: development of treatment plan with patient and/or surrogate as well as nursing, discussions with  consultants, evaluation of patient's response to treatment, examination of patient, obtaining history from patient or surrogate, ordering and performing treatments and interventions, ordering and review of laboratory studies, ordering and review of radiographic studies, pulse oximetry and re-evaluation of patient's condition.    Objective: Vitals:   02/16/20 2358 02/17/20 0300 02/17/20 0429 02/17/20 0435  BP: 115/66 111/62 (!) 153/94   Pulse: 75 76 63   Resp: 16 16 (!) 22   Temp: 97.7 F (36.5 C) 97.8 F (36.6 C)    TempSrc:      SpO2: (!) 80% 96% 91% 99%  Weight:        Intake/Output Summary (Last 24 hours) at 02/17/2020 3545 Last data filed at 02/17/2020 0500 Gross per 24 hour  Intake 100 ml  Output -  Net 100 ml   Filed Weights   02-22-20 1500  Weight: 86.1 kg    Examination:  General exam: Appears anxious Respiratory system: Tachypneic with coarse breath sounds bilaterally cardiovascular system: S1 & S2 heard, RRR. No JVD, murmurs, rubs, gallops or clicks. No pedal edema. Psychiatry: Anxious    CBC: Recent Labs  Lab Feb 22, 2020 1015 02/16/20 0443 02/16/20 2201  WBC 14.7* 10.8*  --   NEUTROABS 13.5* 10.2*  --   HGB 10.3* 8.9* 9.7*  HCT 32.8* 27.7*  --   MCV 76.5* 78.2*  --   PLT 395 406*  --    Basic Metabolic Panel: Recent Labs  Lab 2020/02/22 1015 02/16/20 0443  NA 140 142  K 3.3* 4.2  CL 100 106  CO2 26 24  GLUCOSE 85  121*  BUN 15 14  CREATININE 0.79 0.68  CALCIUM 8.4* 7.9*  MG  --  2.0  PHOS  --  3.3   GFR: Estimated Creatinine Clearance: 73.8 mL/min (by C-G formula based on SCr of 0.68 mg/dL). Liver Function Tests: Recent Labs  Lab 03/03/2020 1015 02/16/20 0443  AST 41 39  ALT 12 13  ALKPHOS 187* 154*  BILITOT 0.4 0.3  PROT 6.9 6.1*  ALBUMIN 2.6* 2.1*   No results for input(s): LIPASE, AMYLASE in the last 168 hours. No results for input(s): AMMONIA in the last 168 hours. Coagulation Profile: No results for input(s): INR, PROTIME in the  last 168 hours. Cardiac Enzymes: No results for input(s): CKTOTAL, CKMB, CKMBINDEX, TROPONINI in the last 168 hours. BNP (last 3 results) No results for input(s): PROBNP in the last 8760 hours. HbA1C: No results for input(s): HGBA1C in the last 72 hours. CBG: No results for input(s): GLUCAP in the last 168 hours. Lipid Profile: No results for input(s): CHOL, HDL, LDLCALC, TRIG, CHOLHDL, LDLDIRECT in the last 72 hours. Thyroid Function Tests: No results for input(s): TSH, T4TOTAL, FREET4, T3FREE, THYROIDAB in the last 72 hours. Anemia Panel: Recent Labs    02/14/2020 2312 02/16/20 0443  FERRITIN 132 128   Sepsis Labs: Recent Labs  Lab 02/18/2020 1015 02/28/2020 1208 02/18/2020 2138  PROCALCITON 0.18  --  0.11  LATICACIDVEN 1.7 1.3  --     Recent Results (from the past 240 hour(s))  SARS CORONAVIRUS 2 (TAT 6-24 HRS)     Status: Abnormal   Collection Time: 02/18/2020  9:57 AM  Result Value Ref Range Status   SARS Coronavirus 2 POSITIVE (A) NEGATIVE Final    Comment: (NOTE) SARS-CoV-2 target nucleic acids are DETECTED.  The SARS-CoV-2 RNA is generally detectable in upper and lower respiratory specimens during the acute phase of infection. Positive results are indicative of the presence of SARS-CoV-2 RNA. Clinical correlation with patient history and other diagnostic information is  necessary to determine patient infection status. Positive results do not rule out bacterial infection or co-infection with other viruses.  The expected result is Negative.  Fact Sheet for Patients: HairSlick.no  Fact Sheet for Healthcare Providers: quierodirigir.com  This test is not yet approved or cleared by the [place] FDA and  has been authorized for detection and/or diagnosis of SARS-CoV-2 by FDA under an Emergency Use Authorization (EUA). This EUA will remain  in effect (meaning this test can be used) for the duration of the COVID-19  declaration under Section 564(b)(1) of the Act, 21 U. S.C. section 360bbb-3(b)(1), unless the authorization is terminated or revoked sooner.   Performed at [facility], [address] [place], [place] [address]   Blood culture (single)     Status: None (Preliminary result)   Collection Time: 03/12/2020 10:15 AM   Specimen: BLOOD  Result Value Ref Range Status   Specimen Description BLOOD LEFT AC  Final   Special Requests   Final    BOTTLES DRAWN AEROBIC AND ANAEROBIC Blood Culture results may not be optimal due to an inadequate volume of blood received in culture bottles   Culture   Final    NO GROWTH < 24 HOURS Performed at [facility], [address], [place], [place] [address]    Report Status PENDING  Incomplete  Culture, blood (routine x 2) Call MD if unable to obtain prior to antibiotics being given     Status: None (Preliminary result)   Collection Time:  03/10/2020  9:38 PM   Specimen: BLOOD  Result Value Ref Range Status   Specimen Description BLOOD LEFT ANTECUBITAL  Final   Special Requests   Final    BOTTLES DRAWN AEROBIC AND ANAEROBIC Blood Culture results may not be optimal due to an inadequate volume of blood received in culture bottles   Culture   Final    NO GROWTH < 12 HOURS Performed at [facility], [address], [place], [place] [address]    Report Status PENDING  Incomplete  Culture, blood (routine x 2) Call MD if unable to obtain prior to antibiotics being given     Status: None (Preliminary result)   Collection Time: 03/04/2020  9:38 PM   Specimen: BLOOD  Result Value Ref Range Status   Specimen Description BLOOD BLOOD RIGHT FOREARM  Final   Special Requests   Final    BOTTLES DRAWN AEROBIC AND ANAEROBIC Blood Culture adequate volume   Culture   Final    NO GROWTH < 12 HOURS Performed at [facility], [address], [place], [place] [address]    Report Status PENDING  Incomplete         Radiology Studies: CT  Angio Chest PE W and/or Wo Contrast  Result Date: 02/25/2020 CLINICAL DATA:  Dyspnea, cough, COVID pneumonia EXAM: CT ANGIOGRAPHY CHEST WITH CONTRAST TECHNIQUE: Multidetector CT imaging of the chest was performed using the standard protocol during bolus administration of intravenous contrast. Multiplanar CT image reconstructions and MIPs were obtained to evaluate the vascular anatomy. CONTRAST:  45mL OMNIPAQUE IOHEXOL 350 MG/ML SOLN COMPARISON:  None. FINDINGS: Cardiovascular: There is adequate opacification of the pulmonary arterial tree. No intraluminal filling defect identified to suggest acute pulmonary embolism. Central pulmonary arteries are of normal caliber. Minimal coronary artery calcification. Global cardiac size within normal limits. No pericardial effusion. Minimal atherosclerotic plaque within the thoracic aorta. Mediastinum/Nodes: The thyroid is unremarkable. No pathologic thoracic adenopathy. Small hiatal hernia. Esophagus unremarkable. Lungs/Pleura: There is extensive diffuse multifocal ground-glass pulmonary infiltrate in keeping with changes of atypical infection and compatible with those seen with COVID-19 pneumonia. There is extensive parenchymal involvement. Pulmonary insufflation is normal and symmetric. No pneumothorax or pleural effusion. Central airways are widely patent. Upper Abdomen: No acute abnormality. Musculoskeletal: No acute bone abnormality. No lytic or blastic bone lesions. Review of the MIP images confirms the above findings. IMPRESSION: Extensive multifocal pulmonary infiltrates, likely infectious and compatible with those seen in COVID-19 pneumonia. Extensive parenchymal involvement. No pulmonary embolism. Minimal coronary artery calcification. Aortic Atherosclerosis (ICD10-I70.0). Electronically Signed   By: Helyn Numbers MD   On: 03/06/2020 12:40   DG Chest Portable 1 View  Result Date: 03/09/2020 CLINICAL DATA:  Hypoxia, COVID, cough, dyspnea EXAM: PORTABLE CHEST 1  VIEW COMPARISON:  04/03/2015 chest radiograph. FINDINGS: Stable cardiomediastinal silhouette with normal heart size. No pneumothorax. No pleural effusion. Extensive patchy opacities throughout both lungs. IMPRESSION: Extensive patchy opacities throughout both lungs, compatible with COVID-19 pneumonia. Electronically Signed   By: Delbert Phenix M.D.   On: 02/28/2020 10:33        Scheduled Meds: . albuterol  2 puff Inhalation Q6H  . amitriptyline  25 mg Oral QHS  . vitamin C  500 mg Oral Daily  . baricitinib  4 mg Oral Daily  . buPROPion  300 mg Oral Daily  . DULoxetine  60 mg Oral Daily  . enoxaparin (LOVENOX) injection  40 mg Subcutaneous Q24H  . guaiFENesin  1,200 mg Oral BID  . levothyroxine  50 mcg Oral Q0600  . methylPREDNISolone (SOLU-MEDROL) injection  0.5 mg/kg Intravenous Q12H   Followed by  . [START ON 02/19/2020] predniSONE  50 mg Oral Daily  . modafinil  100 mg Oral Q breakfast  . pantoprazole  40 mg Oral Daily  . pregabalin  100 mg Oral BID  . propranolol  10 mg Oral BID  . sodium chloride flush  3 mL Intravenous Q12H  . zinc sulfate  220 mg Oral Daily   Continuous Infusions: . sodium chloride Stopped (02/16/20 0318)  . remdesivir 100 mg in NS 100 mL       LOS: 2 days    Time spent: 65    Andris Baumann, MD [facility]

## 2020-02-17 NOTE — Progress Notes (Signed)
   02/17/20 1100  Clinical Encounter Type  Visited With Family  Visit Type Initial;Psychological support;Spiritual support;Social support  Referral From Chaplain  Consult/Referral To Chaplain   Chaplain spoke to Pt's husband in the waiting area. PT was able to express his concerns and fears. PT said he felt more at peace after the chaplains visit. Chaplain will do a follow up with PT one day.

## 2020-02-17 NOTE — Significant Event (Signed)
Rapid Response Event Note   Reason for Call : Covid patient with increased oxygen demand over the course of the shift. Now on 15L HF and NRB   Initial Focused Assessment: Patient sitting upright in bed with NRB and HF in place. RN states she had a drop in her sats and required the increase in oxygen. Pt is alert and can answer orientation questions appropriately. She appears agitated and states she wants to go home and wants her husband.. O2 sats at this time are 97-100% on above settings. Other VS as noted in flowsheet. Dr. Para March to bedside.      Interventions:  Pt's RN will give her some xanax as she takes that for anxiety. Dr. Para March, RT, bedside RN and this nurse agreed that we would see if addressing the anxiety will help.   Plan of Care: Corrie Dandy, Pts RN will monitor patient there for now, RRN will also follow. Will reassess.     Event Summary: Pt to remain on 1C for now. If any decline noted will consider transfer  MD Notified: [facility] Call [phone] Arrival [phone] End Time:0500  Arrie Eastern, RN

## 2020-02-17 NOTE — Progress Notes (Signed)
0800 Received report from 1A. Patient transferred. Patient confused at times and uncooperative. Pulling at HFNC oxygen and refusing to use 100% NRB. Oxygen saturations up and down. Finally about 1230 patient pulled off mask and HFNC oxygen and did not recover well. Sedated, intubated and placed on ventilator. Calmer and oxygen saturations better, Right IJ TLC placed as well as foley catheter. 1600 Resting quietly.

## 2020-02-17 NOTE — Consult Note (Signed)
CRITICAL CARE PROGRESS NOTE    Name: Summer Holloway MRN: [id number] DOB: [birth year]     LOS: 2  Referring physician: Dr Darrick Meigs  SUBJECTIVE FINDINGS & SIGNIFICANT EVENTS    Patient description:   70 yo F hx of fibromyalgia, osteoarthritis, hypertension, hypothyroidism and GERD who was brought into the ER by EMS for evaluation of shortness of breath and cough.  Patient has been sick for about 2 weeks and had a positive COVID-19 home test on 02/04/20.   Per EMS patient was hypoxic in the field and was placed on a nonrebreather mask @ 15 L and was transported to the ER.  Patient was found to be encephalopathic with worsening SpO2.  Patient was brought to MICU due to acute hypoxemic respiratory failure worsening on HFNC.  I spoke to Mr Lengel ([place] husband) he was able to see patient throught glass door noted severe resp distress and wished for ETT placement and sedation.   Lines/tubes : Airway 7.5 mm (Active)  Secured at (cm) 23 cm 02/17/20 1256  Measured From Lips 02/17/20 Flasher 02/17/20 1256  Secured By [employer] 02/17/20 1256  Cuff Pressure (cm H2O) 27 cm H2O 02/17/20 1256     Closed System Drain Right Abdomen Bulb (JP) 19 Fr. (Active)     External Urinary Catheter (Active)  Collection Container Dedicated Suction Canister 02/17/20 [phone]  Site Assessment Clean;Intact 02/17/20 0855  Intervention Other (Comment) 02/17/20 0855    Microbiology/Sepsis markers: Results for orders placed or performed during the hospital encounter of 03/08/2020  SARS CORONAVIRUS 2 (TAT 6-24 HRS)     Status: Abnormal   Collection Time: 03/04/2020  9:57 AM  Result Value Ref Range Status   SARS Coronavirus 2 POSITIVE (A) NEGATIVE Final    Comment: (NOTE) SARS-CoV-2 target nucleic acids are  DETECTED.  The SARS-CoV-2 RNA is generally detectable in upper and lower respiratory specimens during the acute phase of infection. Positive results are indicative of the presence of SARS-CoV-2 RNA. Clinical correlation with patient history and other diagnostic information is  necessary to determine patient infection status. Positive results do not rule out bacterial infection or co-infection with other viruses.  The expected result is Negative.  Fact Sheet for Patients: SugarRoll.be  Fact Sheet for Healthcare Providers: https://www.woods-mathews.com/  This test is not yet approved or cleared by the [place] FDA and  has been authorized for detection and/or diagnosis of SARS-CoV-2 by FDA under an Emergency Use Authorization (EUA). This EUA will remain  in effect (meaning this test can be used) for the duration of the COVID-19 declaration under Section 564(b)(1) of the Act, 21 U. S.C. section 360bbb-3(b)(1), unless the authorization is terminated or revoked sooner.   Performed at [facility], [place] [address] [place], [place] [address]   Blood culture (single)     Status: None (Preliminary result)   Collection Time: 02/19/2020 10:15 AM   Specimen: BLOOD  Result Value Ref Range Status   Specimen Description BLOOD LEFT AC  Final   Special Requests   Final    BOTTLES DRAWN AEROBIC AND ANAEROBIC Blood Culture results may not be optimal due to an inadequate volume of blood received in culture bottles   Culture   Final    NO GROWTH 2 DAYS Performed at [facility], [address] [place], [place] [address]    Report Status PENDING  Incomplete  Culture, blood (routine x 2) Call MD if unable to obtain prior  to antibiotics being given     Status: None (Preliminary result)   Collection Time: 02/14/2020  9:38 PM   Specimen: BLOOD  Result Value Ref Range Status   Specimen Description BLOOD LEFT ANTECUBITAL  Final   Special  Requests   Final    BOTTLES DRAWN AEROBIC AND ANAEROBIC Blood Culture results may not be optimal due to an inadequate volume of blood received in culture bottles   Culture   Final    NO GROWTH 2 DAYS Performed at [facility], [address], [place], [place] [address]    Report Status PENDING  Incomplete  Culture, blood (routine x 2) Call MD if unable to obtain prior to antibiotics being given     Status: None (Preliminary result)   Collection Time: 02/23/2020  9:38 PM   Specimen: BLOOD  Result Value Ref Range Status   Specimen Description BLOOD BLOOD RIGHT FOREARM  Final   Special Requests   Final    BOTTLES DRAWN AEROBIC AND ANAEROBIC Blood Culture adequate volume   Culture   Final    NO GROWTH 2 DAYS Performed at [facility], [address], [place], [place] [address]    Report Status PENDING  Incomplete    Anti-infectives:  Anti-infectives (From admission, onward)   Start     Dose/Rate Route Frequency Ordered Stop   02/17/20 1000  remdesivir 100 mg in sodium chloride 0.9 % 100 mL IVPB       "Followed by" Linked Group Details   100 mg 200 mL/hr over 30 Minutes Intravenous Daily 02/16/20 1147 02/21/20 0959   02/16/20 1245  remdesivir 200 mg in sodium chloride 0.9% 250 mL IVPB       "Followed by" Linked Group Details   200 mg 580 mL/hr over 30 Minutes Intravenous Once 02/16/20 1147 02/16/20 2219   02/16/20 1000  remdesivir 100 mg in sodium chloride 0.9 % 100 mL IVPB  Status:  Discontinued       "Followed by" Linked Group Details   100 mg 200 mL/hr over 30 Minutes Intravenous Daily 02/19/2020 1135 03/10/2020 2102   02/16/20 1000  remdesivir 100 mg in sodium chloride 0.9 % 100 mL IVPB  Status:  Discontinued       "Followed by" Linked Group Details   100 mg 200 mL/hr over 30 Minutes Intravenous Daily 03/06/2020 1343 02/24/2020 1352   02/14/2020 2200  levofloxacin (LEVAQUIN) IVPB 750 mg  Status:  Discontinued        750 mg 100 mL/hr over 90 Minutes Intravenous Every  24 hours 03/05/2020 2107 03/04/2020 2117   02/28/2020 2200  cefTRIAXone (ROCEPHIN) 1 g in sodium chloride 0.9 % 100 mL IVPB  Status:  Discontinued        1 g 200 mL/hr over 30 Minutes Intravenous Every 24 hours 02/23/2020 2117 02/16/20 0231   02/27/2020 2130  azithromycin (ZITHROMAX) 500 mg in sodium chloride 0.9 % 250 mL IVPB  Status:  Discontinued        500 mg 250 mL/hr over 60 Minutes Intravenous Every 24 hours 03/07/2020 2117 02/16/20 0231   02/28/2020 1415  remdesivir 200 mg in sodium chloride 0.9% 250 mL IVPB  Status:  Discontinued       "Followed by" Linked Group Details   200 mg 580 mL/hr over 30 Minutes Intravenous Once 02/22/2020 1343 03/06/2020 1352   02/17/2020 1300  remdesivir 200 mg in sodium chloride 0.9% 250 mL IVPB  Status:  Discontinued       "  Followed by" Linked Group Details   200 mg 580 mL/hr over 30 Minutes Intravenous Once 02/17/2020 1135 03/10/2020 2102       PAST MEDICAL HISTORY   Past Medical History:  Diagnosis Date  . Anxiety   . Bursitis of left hip   . Depression   . Dysrhythmia    diagnosed pvc 1980  . Esophageal reflux   . Fibromyalgia   . Hearing loss   . Hyperlipemia   . Hyperlipidemia   . Hypertension   . Hypothyroidism   . Multiple sclerosis ([place])   . Osteoarthritis   . PVC (premature ventricular contraction)   . Sciatica   . Tinnitus   . Uterine prolapse      SURGICAL HISTORY   Past Surgical History:  Procedure Laterality Date  . BREAST BIOPSY  05/24/2002  . BREAST BIOPSY Left 05/30/2016   negative  . CATARACT EXTRACTION, BILATERAL    . CHOLECYSTECTOMY N/A 03/24/2019   Procedure: LAPAROSCOPIC CHOLECYSTECTOMY;  Surgeon: Clovis Riley, MD;  Location: [facility];  Service: General;  Laterality: N/A;  . PARTIAL HYSTERECTOMY    . surgical mesh     of uterine  . vaginal hemorrhoid surgery        FAMILY HISTORY   Family History  Problem Relation Age of Onset  . Fibromyalgia Mother   . Anxiety disorder Mother   . Heart attack Father 79  . CAD  Father   . Heart attack Cousin      SOCIAL HISTORY   Social History   Tobacco Use  . Smoking status: Never Smoker  . Smokeless tobacco: Never Used  Vaping Use  . Vaping Use: Never used  Substance Use Topics  . Alcohol use: No  . Drug use: Never     MEDICATIONS   Current Medication:  Current Facility-Administered Medications:  .  0.9 %  sodium chloride infusion, 250 mL, Intravenous, PRN, Agbata, Tochukwu, MD, Stopping Infusion hung by another clincian at 02/16/20 0318 .  acetaminophen (TYLENOL) tablet 650 mg, 650 mg, Oral, Q6H PRN, Agbata, Tochukwu, MD, 650 mg at 02/16/20 0337 .  albuterol (VENTOLIN HFA) 108 (90 Base) MCG/ACT inhaler 2 puff, 2 puff, Inhalation, Q6H, Agbata, Tochukwu, MD, 2 puff at 02/17/20 0919 .  ALPRAZolam Duanne Moron) tablet 0.25 mg, 0.25 mg, Oral, QHS PRN, Agbata, Tochukwu, MD, 0.25 mg at 02/16/20 2029 .  amitriptyline (ELAVIL) tablet 25 mg, 25 mg, Oral, QHS, Agbata, Tochukwu, MD, 25 mg at 02/16/20 2041 .  ascorbic acid (VITAMIN C) tablet 500 mg, 500 mg, Oral, Daily, Agbata, Tochukwu, MD, 500 mg at 02/16/20 0959 .  aspirin-acetaminophen-caffeine (EXCEDRIN MIGRAINE) per tablet 1 tablet, 1 tablet, Oral, Q6H PRN, Agbata, Tochukwu, MD .  baricitinib (OLUMIANT) tablet 4 mg, 4 mg, Oral, Daily, Shanlever, Charles M, RPH .  buPROPion (WELLBUTRIN XL) 24 hr tablet 300 mg, 300 mg, Oral, Daily, Agbata, Tochukwu, MD, 300 mg at 02/16/20 0959 .  Chlorhexidine Gluconate Cloth 2 % PADS 6 each, 6 each, Topical, Daily, Ottie Glazier, MD, 6 each at 02/17/20 0920 .  dexmedetomidine (PRECEDEX) 400 MCG/100ML (4 mcg/mL) infusion, 0.4-1.2 mcg/kg/hr, Intravenous, Titrated, Essa Wenk, MD .  DULoxetine (CYMBALTA) DR capsule 60 mg, 60 mg, Oral, Daily, Agbata, Tochukwu, MD, 60 mg at 02/16/20 1001 .  enoxaparin (LOVENOX) injection 40 mg, 40 mg, Subcutaneous, Q24H, Agbata, Tochukwu, MD, 40 mg at 02/16/20 1411 .  fentaNYL (SUBLIMAZE) 100 MCG/2ML injection, , , ,  .  fentaNYL (SUBLIMAZE)  injection 150 mcg, 150 mcg, Intravenous, Once, Enas Winchel,  MD .  fentaNYL 2570mg in NS 2536m(1035mml) infusion-PREMIX, 0-400 mcg/hr, Intravenous, Continuous, Deovion Batrez, MD .  furosemide (LASIX) injection 40 mg, 40 mg, Intravenous, Once, LamIraqagMarge DuncansD .  guaiFENesin (MUCINEX) 12 hr tablet 1,200 mg, 1,200 mg, Oral, BID, LamDarrick MeigsagMarge DuncansD, 1,200 mg at 02/16/20 2040 .  HYDROcodone-homatropine (HYCODAN) 5-1.5 MG/5ML syrup 5 mL, 5 mL, Oral, Q6H PRN, IsaDuffy BruceD, 5 mL at 02/16/20 0958 .  levothyroxine (SYNTHROID) tablet 50 mcg, 50 mcg, Oral, Q0600, Agbata, Tochukwu, MD, 50 mcg at 02/17/20 0544 .  menthol-cetylpyridinium (CEPACOL) lozenge 3 mg, 1 lozenge, Oral, PRN, LamOswald HillockD, 3 mg at 02/16/20 1011 .  methylPREDNISolone sodium succinate (SOLU-MEDROL) 125 mg/2 mL injection 43.125 mg, 0.5 mg/kg, Intravenous, Q12H, 43.125 mg at 02/16/20 2033 **FOLLOWED BY** [START ON 02/19/2020] predniSONE (DELTASONE) tablet 50 mg, 50 mg, Oral, Daily, Agbata, Tochukwu, MD .  modafinil (PROVIGIL) tablet 100 mg, 100 mg, Oral, Q breakfast, LamDarrick Meigsagan S, MD .  ondansetron (ZOFRAN) tablet 4 mg, 4 mg, Oral, Q6H PRN **OR** ondansetron (ZOFRAN) injection 4 mg, 4 mg, Intravenous, Q6H PRN, Agbata, Tochukwu, MD .  pantoprazole (PROTONIX) EC tablet 40 mg, 40 mg, Oral, Daily, Agbata, Tochukwu, MD, 40 mg at 02/16/20 1000 .  phenol (CHLORASEPTIC) mouth spray 1 spray, 1 spray, Mouth/Throat, PRN, Agbata, Tochukwu, MD, 1 spray at 02/16/20 [id number] pregabalin (LYRICA) capsule 100 mg, 100 mg, Oral, BID, Agbata, Tochukwu, MD, 100 mg at 02/16/20 2041 .  propranolol (INDERAL) tablet 10 mg, 10 mg, Oral, BID, Agbata, Tochukwu, MD, 10 mg at 02/16/20 2037 .  [COMPLETED] remdesivir 200 mg in sodium chloride 0.9% 250 mL IVPB, 200 mg, Intravenous, Once, Stopping Infusion hung by another clincian at 02/16/20 2219 **FOLLOWED BY** remdesivir 100 mg in sodium chloride 0.9 % 100 mL IVPB, 100 mg, Intravenous, Daily, LamDarrick MeigsagMarge DuncansMD .  rocuronium (ZECommunity Memorial Hospitalnjection 150 mg, 150 mg, Intravenous, Once, Wylie Coon, MD .  sodium chloride flush (NS) 0.9 % injection 3 mL, 3 mL, Intravenous, Q12H, Agbata, Tochukwu, MD, 3 mL at 02/17/20 0921 .  sodium chloride flush (NS) 0.9 % injection 3 mL, 3 mL, Intravenous, PRN, Agbata, Tochukwu, MD .  traMADol (ULTRAM) tablet 50 mg, 50 mg, Oral, Q6H PRN, Agbata, Tochukwu, MD .  zinc sulfate capsule 220 mg, 220 mg, Oral, Daily, Agbata, Tochukwu, MD, 220 mg at 02/16/20 1000    ALLERGIES   Pizza flavor and Penicillins    REVIEW OF SYSTEMS    10 point ROS done and is negative due to encephalopathy and hypoxemic respiratory failure.   PHYSICAL EXAMINATION   Vital Signs: Temp:  [97.7 F (36.5 C)-98.6 F (37 C)] 97.9 F (36.6 C) (02/04 0842) Pulse Rate:  [63-76] 72 (02/04 0842) Resp:  [16-31] 31 (02/04 0842) BP: (83-153)/(61-94) 126/67 (02/04 0842) SpO2:  [80 %-100 %] 94 % (02/04 1256) FiO2 (%):  [60 %-100 %] 60 % (02/04 1256)  GENERAL:Age appropirate in acute distress due to hypoxemia HEAD: Normocephalic, atraumatic.  EYES: Pupils equal, round, reactive to light.  No scleral icterus.  MOUTH: Moist mucosal membrane. NECK: Supple. No thyromegaly. No nodules. No JVD.  PULMONARY: rhonchorous breath sounds bilateraly CARDIOVASCULAR: S1 and S2. Regular rate and rhythm. No murmurs, rubs, or gallops.  GASTROINTESTINAL: Soft, nontender, non-distended. No masses. Positive bowel sounds. No hepatosplenomegaly.  MUSCULOSKELETAL: No swelling, clubbing, or edema.  NEUROLOGIC: Mild distress due to acute illness with encephalopathy in context of poss MS flare  SKIN:intact,warm,dry   PERTINENT DATA  Infusions: . sodium chloride Stopped (02/16/20 0318)  . dexmedetomidine (PRECEDEX) IV infusion    . fentaNYL infusion INTRAVENOUS    . remdesivir 100 mg in NS 100 mL     Scheduled Medications: . albuterol  2 puff Inhalation Q6H  . amitriptyline  25 mg Oral QHS  . vitamin C   500 mg Oral Daily  . baricitinib  4 mg Oral Daily  . buPROPion  300 mg Oral Daily  . Chlorhexidine Gluconate Cloth  6 each Topical Daily  . DULoxetine  60 mg Oral Daily  . enoxaparin (LOVENOX) injection  40 mg Subcutaneous Q24H  . fentaNYL      . fentaNYL (SUBLIMAZE) injection  150 mcg Intravenous Once  . furosemide  40 mg Intravenous Once  . guaiFENesin  1,200 mg Oral BID  . levothyroxine  50 mcg Oral Q0600  . methylPREDNISolone (SOLU-MEDROL) injection  0.5 mg/kg Intravenous Q12H   Followed by  . [START ON 02/19/2020] predniSONE  50 mg Oral Daily  . modafinil  100 mg Oral Q breakfast  . pantoprazole  40 mg Oral Daily  . pregabalin  100 mg Oral BID  . propranolol  10 mg Oral BID  . rocuronium  150 mg Intravenous Once  . sodium chloride flush  3 mL Intravenous Q12H  . zinc sulfate  220 mg Oral Daily   PRN Medications: sodium chloride, acetaminophen, ALPRAZolam, aspirin-acetaminophen-caffeine, HYDROcodone-homatropine, menthol-cetylpyridinium, ondansetron **OR** ondansetron (ZOFRAN) IV, phenol, sodium chloride flush, traMADol Hemodynamic parameters:   Intake/Output: 02/03 0701 - 02/04 0700 In: 100 [IV Piggyback:100] Out: -   Ventilator  Settings: Vent Mode: PRVC FiO2 (%):  [60 %-100 %] 60 % Set Rate:  [15 bmp] 15 bmp Vt Set:  [450 mL] 450 mL PEEP:  [5 cmH20] 5 cmH20  LAB RESULTS:  Basic Metabolic Panel: Recent Labs  Lab 02/22/2020 1015 02/16/20 0443 02/17/20 1001  NA 140 142 142  K 3.3* 4.2 4.0  CL 100 106 103  CO2 26 24 26   GLUCOSE 85 121* 76  BUN 15 14 23   CREATININE 0.79 0.68 0.81  CALCIUM 8.4* 7.9* 8.3*  MG  --  2.0 2.0  PHOS  --  3.3 3.9   Liver Function Tests: Recent Labs  Lab 02/24/2020 1015 02/16/20 0443 02/17/20 1001  AST 41 39 45*  ALT 12 13 15   ALKPHOS 187* 154* 153*  BILITOT 0.4 0.3 0.5  PROT 6.9 6.1* 6.7  ALBUMIN 2.6* 2.1* 2.6*   No results for input(s): LIPASE, AMYLASE in the last 168 hours. No results for input(s): AMMONIA in the last 168  hours. CBC: Recent Labs  Lab 02/24/2020 1015 02/16/20 0443 02/16/20 2201 02/17/20 1001  WBC 14.7* 10.8*  --  19.8*  NEUTROABS 13.5* 10.2*  --  18.9*  HGB 10.3* 8.9* 9.7* 9.6*  HCT 32.8* 27.7*  --  31.6*  MCV 76.5* 78.2*  --  81.7  PLT 395 406*  --  480*   Cardiac Enzymes: No results for input(s): CKTOTAL, CKMB, CKMBINDEX, TROPONINI in the last 168 hours. BNP: Invalid input(s): POCBNP CBG: Recent Labs  Lab 02/17/20 0917  GLUCAP 72       IMAGING RESULTS:  Imaging: DG Chest Port 1V same Day  Result Date: 02/17/2020 CLINICAL DATA:  COVID-19 pneumonia, acute hypoxemic respiratory failure. EXAM: PORTABLE CHEST 1 VIEW COMPARISON:  February 15, 2020. FINDINGS: The heart size and mediastinal contours are within normal limits. No pneumothorax or pleural effusion is noted. Bilateral patchy airspace opacities are noted consistent with  multifocal pneumonia. The visualized skeletal structures are unremarkable. IMPRESSION: Bilateral patchy airspace opacities are noted consistent with multifocal pneumonia. Electronically Signed   By: Marijo Conception M.D.   On: 02/17/2020 08:55   @PROBHOSP @ DG Chest Port 1V same Day  Result Date: 02/17/2020 CLINICAL DATA:  COVID-19 pneumonia, acute hypoxemic respiratory failure. EXAM: PORTABLE CHEST 1 VIEW COMPARISON:  February 15, 2020. FINDINGS: The heart size and mediastinal contours are within normal limits. No pneumothorax or pleural effusion is noted. Bilateral patchy airspace opacities are noted consistent with multifocal pneumonia. The visualized skeletal structures are unremarkable. IMPRESSION: Bilateral patchy airspace opacities are noted consistent with multifocal pneumonia. Electronically Signed   By: Marijo Conception M.D.   On: 02/17/2020 08:55        ASSESSMENT AND PLAN    -Multidisciplinary rounds held today  Acute Hypoxic Respiratory Failure Acute COVID19 pneumonia -Remdesevir antiviral - pharmacy protocol 5 d -vitamin C -zinc -decadron  46m IV daily  -Diuresis - Lasix 40 IV daily - monitor UOP - utilize external urinary catheter if possible -Self prone if patient can tolerate  -encourage to use IS and Acapella device for bronchopulmonary hygiene when able -d/c hepatotoxic medications while on remdesevir -supportive care with ICU telemetry monitoring -PT/OT when possible -procalcitonin, CRP and ferritin trending -S/p ETT -continue Bronchodilator Therapy -Wean Fio2 and PEEP as tolerated -will perform SAT/SBT when respiratory parameters are met    NEUROLOGY - intubated and sedated -patent has hx of MS - minimal sedation to achieve a RASS goal: -1 Wake up assessment pending   Hypothyroidism   - Levothyroxine - 153mdaily  ID -continue IV abx as prescibed -follow up cultures  GI/Nutrition GI PROPHYLAXIS as indicated DIET-->TF's as tolerated Constipation protocol as indicated  ENDO - ICU hypoglycemic\Hyperglycemia protocol -check FSBS per protocol   ELECTROLYTES -follow labs as needed -replace as needed -pharmacy consultation   DVT/GI PRX ordered -SCDs  TRANSFUSIONS AS NEEDED MONITOR FSBS ASSESS the need for LABS as needed   Critical care provider statement:    Critical care time (minutes):  33   Critical care time was exclusive of:  Separately billable procedures and treating other patients   Critical care was necessary to treat or prevent imminent or life-threatening deterioration of the following conditions:  Acute hypoxemic respiratory faiulre due to COVID19, MS with possible acute exacerbation   Critical care was time spent personally by me on the following activities:  Development of treatment plan with patient or surrogate, discussions with consultants, evaluation of patient's response to treatment, examination of patient, obtaining history from patient or surrogate, ordering and performing treatments and interventions, ordering and review of laboratory studies and re-evaluation of patient's  condition.  I assumed direction of critical care for this patient from another provider in my specialty: no    This document was prepared using Dragon voice recognition software and may include unintentional dictation errors.    FuOttie GlazierM.D.  Division of [facility]

## 2020-02-17 NOTE — Progress Notes (Signed)
Pt earings removed and given to husband to take home. Pulse ox placed on pt ear.

## 2020-02-17 NOTE — Progress Notes (Addendum)
[facility]  PROGRESS NOTE  Summer Holloway YHC:[id number] DOB: September 29, 1950 DOA: 03/05/20 PCP: Marden Noble, MD   Brief HPI:   70 year old female with medical history of fibromyalgia, osteoarthritis, hypertension, hypothyroidism, GERD was brought to ED for shortness of breath and cough.  Patient has been sick for past 2 weeks and had positive COVID-19 test at home on 02/02/2020.  Per EMS patient was hypoxic in the field and was placed on nonrebreather mask at 15 L/min. In the ED chest x-ray reviewed showed patchy opacities throughout both lung fields compatible with COVID-19 pneumonia.  CTA chest showed extensive pulmonary infiltrates likely infectious.  No PE.   Subjective   Patient seen and examined, her breathing became worse last night triggering rapid response.  Patient is currently on 15 L 100% NRB.  She is resting comfortably, however O2 sats are hovering around upper 80s.  Husband at bedside.  ABG and chest x-ray obtained today shows acute hypoxemic respiratory failure and bilateral multifocal pneumonia.  Patient was given 1 dose of Lasix yesterday.   Assessment/Plan:    1. Acute hypoxemic respiratory failure-due to COVID-19 pneumonia, patient presented with worsening shortness of breath, chest x-ray shows multifocal infiltrates.  CTA negative for PE but showed multifocal infiltrates bilaterally.  Patient started on remdesivir, Solu-Medrol, baricitinib.  CRP is down to 13.9.  Patient oxygen requirement has slowly gone up, will transfer her to ICU for closer monitoring and also transition to heated high flow nasal cannula as needed.  Will give additional dose of Lasix 40 mg IV x1.  Continue  Mucinex 1200 mg p.o. twice daily.Follow CRP in AM.  Continue incentive spirometry, flutter valve.  Encourage prone position as tolerated. 2. Hypothyroidism-continue Synthroid 3. Anxiety/depression-continue potassium, amitriptyline, bupropion, Cymbalta. 4. GERD-continue  Protonix 5. Leukocytosis-WBC is up to 19,000 this morning.  Likely from steroids.  She continues to be afebrile, procalcitonin was 0.11.  Follow CBC in a.m.   COVID-19 Labs  Recent Labs    2020-03-05 2138 05-Mar-2020 2312 02/16/20 0443  FERRITIN  --  132 128  CRP 17.1* 15.1* 13.9*    Lab Results  Component Value Date   SARSCOV2NAA POSITIVE (A) 03-05-2020   SARSCOV2NAA NEGATIVE 03/22/2019     Scheduled medications:   . albuterol  2 puff Inhalation Q6H  . amitriptyline  25 mg Oral QHS  . vitamin C  500 mg Oral Daily  . baricitinib  4 mg Oral Daily  . buPROPion  300 mg Oral Daily  . Chlorhexidine Gluconate Cloth  6 each Topical Daily  . DULoxetine  60 mg Oral Daily  . enoxaparin (LOVENOX) injection  40 mg Subcutaneous Q24H  . furosemide  40 mg Intravenous Once  . guaiFENesin  1,200 mg Oral BID  . levothyroxine  50 mcg Oral Q0600  . methylPREDNISolone (SOLU-MEDROL) injection  0.5 mg/kg Intravenous Q12H   Followed by  . [START ON 02/19/2020] predniSONE  50 mg Oral Daily  . modafinil  100 mg Oral Q breakfast  . pantoprazole  40 mg Oral Daily  . pregabalin  100 mg Oral BID  . propranolol  10 mg Oral BID  . sodium chloride flush  3 mL Intravenous Q12H  . zinc sulfate  220 mg Oral Daily     CBG: Recent Labs  Lab 02/17/20 0917  GLUCAP 72    SpO2: (!) 81 % O2 Flow Rate (L/min): 15 L/min (pt unable to tolerate NRB) FiO2 (%): 100 %    CBC: Recent Labs  Lab Mar 05, 2020 1015  02/16/20 0443 02/16/20 2201 02/17/20 1001  WBC 14.7* 10.8*  --  19.8*  NEUTROABS 13.5* 10.2*  --  18.9*  HGB 10.3* 8.9* 9.7* 9.6*  HCT 32.8* 27.7*  --  31.6*  MCV 76.5* 78.2*  --  81.7  PLT 395 406*  --  480*    Basic Metabolic Panel: Recent Labs  Lab 2020/03/08 1015 02/16/20 0443  NA 140 142  K 3.3* 4.2  CL 100 106  CO2 26 24  GLUCOSE 85 121*  BUN 15 14  CREATININE 0.79 0.68  CALCIUM 8.4* 7.9*  MG  --  2.0  PHOS  --  3.3     Liver Function Tests: Recent Labs  Lab 2020/03/08 1015  02/16/20 0443  AST 41 39  ALT 12 13  ALKPHOS 187* 154*  BILITOT 0.4 0.3  PROT 6.9 6.1*  ALBUMIN 2.6* 2.1*     Antibiotics: Anti-infectives (From admission, onward)   Start     Dose/Rate Route Frequency Ordered Stop   02/17/20 1000  remdesivir 100 mg in sodium chloride 0.9 % 100 mL IVPB       "Followed by" Linked Group Details   100 mg 200 mL/hr over 30 Minutes Intravenous Daily 02/16/20 1147 02/21/20 0959   02/16/20 1245  remdesivir 200 mg in sodium chloride 0.9% 250 mL IVPB       "Followed by" Linked Group Details   200 mg 580 mL/hr over 30 Minutes Intravenous Once 02/16/20 1147 02/16/20 2219   02/16/20 1000  remdesivir 100 mg in sodium chloride 0.9 % 100 mL IVPB  Status:  Discontinued       "Followed by" Linked Group Details   100 mg 200 mL/hr over 30 Minutes Intravenous Daily 03/08/2020 1135 2020-03-08 2102   02/16/20 1000  remdesivir 100 mg in sodium chloride 0.9 % 100 mL IVPB  Status:  Discontinued       "Followed by" Linked Group Details   100 mg 200 mL/hr over 30 Minutes Intravenous Daily 03-08-20 1343 03-08-2020 1352   03/08/2020 2200  levofloxacin (LEVAQUIN) IVPB 750 mg  Status:  Discontinued        750 mg 100 mL/hr over 90 Minutes Intravenous Every 24 hours 03/08/20 2107 Mar 08, 2020 2117   08-Mar-2020 2200  cefTRIAXone (ROCEPHIN) 1 g in sodium chloride 0.9 % 100 mL IVPB  Status:  Discontinued        1 g 200 mL/hr over 30 Minutes Intravenous Every 24 hours 03/08/20 2117 02/16/20 0231   03-08-2020 2130  azithromycin (ZITHROMAX) 500 mg in sodium chloride 0.9 % 250 mL IVPB  Status:  Discontinued        500 mg 250 mL/hr over 60 Minutes Intravenous Every 24 hours 03/08/20 2117 02/16/20 0231   2020-03-08 1415  remdesivir 200 mg in sodium chloride 0.9% 250 mL IVPB  Status:  Discontinued       "Followed by" Linked Group Details   200 mg 580 mL/hr over 30 Minutes Intravenous Once March 08, 2020 1343 2020-03-08 1352   03-08-20 1300  remdesivir 200 mg in sodium chloride 0.9% 250 mL IVPB  Status:   Discontinued       "Followed by" Linked Group Details   200 mg 580 mL/hr over 30 Minutes Intravenous Once 2020-03-08 1135 03/08/20 2102       DVT prophylaxis: Lovenox  Code Status: Full code  Family Communication: Discussed with patient husband at bedside   Consultants:  Procedures:      Objective   Vitals:   02/17/20 0300  02/17/20 0429 02/17/20 0435 02/17/20 0842  BP: 111/62 (!) 153/94  126/67  Pulse: 76 63  72  Resp: 16 (!) 22  (!) 31  Temp: 97.8 F (36.6 C)   97.9 F (36.6 C)  TempSrc:      SpO2: 96% 91% 99% (!) 81%  Weight:        Intake/Output Summary (Last 24 hours) at 02/17/2020 1033 Last data filed at 02/17/2020 9753 Gross per 24 hour  Intake 103 ml  Output --  Net 103 ml    02/02 1901 - 02/04 0700 In: 549  Out: -   Filed Weights   03-08-20 1500  Weight: 86.1 kg    Physical Examination:   General-appears in no acute distress  Heart-S1-S2, regular, no murmur auscultated  Lungs-bibasilar crackles auscultated  Abdomen-soft, nontender, no organomegaly  Extremities-no edema in the lower extremities  Neuro-alert, oriented x3, no focal deficit noted   Status is: Inpatient  Dispo: The patient is from: Home              Anticipated d/c is to: Home              Anticipated d/c date is: 02/18/2020              Patient currently not stable for discharge  Barrier to discharge-acute hypoxemic respiratory failure due to COVID-19 pneumonia            Data Reviewed:   Recent Results (from the past 240 hour(s))  SARS CORONAVIRUS 2 (TAT 6-24 HRS)     Status: Abnormal   Collection Time: 2020/03/08  9:57 AM  Result Value Ref Range Status   SARS Coronavirus 2 POSITIVE (A) NEGATIVE Final    Comment: (NOTE) SARS-CoV-2 target nucleic acids are DETECTED.  The SARS-CoV-2 RNA is generally detectable in upper and lower respiratory specimens during the acute phase of infection. Positive results are indicative of the presence of SARS-CoV-2 RNA.  Clinical correlation with patient history and other diagnostic information is  necessary to determine patient infection status. Positive results do not rule out bacterial infection or co-infection with other viruses.  The expected result is Negative.  Fact Sheet for Patients: HairSlick.no  Fact Sheet for Healthcare Providers: quierodirigir.com  This test is not yet approved or cleared by the [place] FDA and  has been authorized for detection and/or diagnosis of SARS-CoV-2 by FDA under an Emergency Use Authorization (EUA). This EUA will remain  in effect (meaning this test can be used) for the duration of the COVID-19 declaration under Section 564(b)(1) of the Act, 21 U. S.C. section 360bbb-3(b)(1), unless the authorization is terminated or revoked sooner.   Performed at [facility], [address] [place], [place] [address]   Blood culture (single)     Status: None (Preliminary result)   Collection Time: 2020/03/08 10:15 AM   Specimen: BLOOD  Result Value Ref Range Status   Specimen Description BLOOD LEFT AC  Final   Special Requests   Final    BOTTLES DRAWN AEROBIC AND ANAEROBIC Blood Culture results may not be optimal due to an inadequate volume of blood received in culture bottles   Culture   Final    NO GROWTH 2 DAYS Performed at [facility], [address] [place], [place] [address]    Report Status PENDING  Incomplete  Culture, blood (routine x 2) Call MD if unable to obtain prior to antibiotics being given     Status: None (Preliminary result)  Collection Time: 03-13-2020  9:38 PM   Specimen: BLOOD  Result Value Ref Range Status   Specimen Description BLOOD LEFT ANTECUBITAL  Final   Special Requests   Final    BOTTLES DRAWN AEROBIC AND ANAEROBIC Blood Culture results may not be optimal due to an inadequate volume of blood received in culture bottles   Culture   Final    NO GROWTH 2  DAYS Performed at [facility], [address], [place], [place] [address]    Report Status PENDING  Incomplete  Culture, blood (routine x 2) Call MD if unable to obtain prior to antibiotics being given     Status: None (Preliminary result)   Collection Time: March 13, 2020  9:38 PM   Specimen: BLOOD  Result Value Ref Range Status   Specimen Description BLOOD BLOOD RIGHT FOREARM  Final   Special Requests   Final    BOTTLES DRAWN AEROBIC AND ANAEROBIC Blood Culture adequate volume   Culture   Final    NO GROWTH 2 DAYS Performed at [facility], [address], [place], [place] [address]    Report Status PENDING  Incomplete    BNP (last 3 results) Recent Labs    2020/03/13 1010  BNP 103.7*    Studies:  CT Angio Chest PE W and/or Wo Contrast  Result Date: 2020/03/13 CLINICAL DATA:  Dyspnea, cough, COVID pneumonia EXAM: CT ANGIOGRAPHY CHEST WITH CONTRAST TECHNIQUE: Multidetector CT imaging of the chest was performed using the standard protocol during bolus administration of intravenous contrast. Multiplanar CT image reconstructions and MIPs were obtained to evaluate the vascular anatomy. CONTRAST:  25mL OMNIPAQUE IOHEXOL 350 MG/ML SOLN COMPARISON:  None. FINDINGS: Cardiovascular: There is adequate opacification of the pulmonary arterial tree. No intraluminal filling defect identified to suggest acute pulmonary embolism. Central pulmonary arteries are of normal caliber. Minimal coronary artery calcification. Global cardiac size within normal limits. No pericardial effusion. Minimal atherosclerotic plaque within the thoracic aorta. Mediastinum/Nodes: The thyroid is unremarkable. No pathologic thoracic adenopathy. Small hiatal hernia. Esophagus unremarkable. Lungs/Pleura: There is extensive diffuse multifocal ground-glass pulmonary infiltrate in keeping with changes of atypical infection and compatible with those seen with COVID-19 pneumonia. There is extensive parenchymal  involvement. Pulmonary insufflation is normal and symmetric. No pneumothorax or pleural effusion. Central airways are widely patent. Upper Abdomen: No acute abnormality. Musculoskeletal: No acute bone abnormality. No lytic or blastic bone lesions. Review of the MIP images confirms the above findings. IMPRESSION: Extensive multifocal pulmonary infiltrates, likely infectious and compatible with those seen in COVID-19 pneumonia. Extensive parenchymal involvement. No pulmonary embolism. Minimal coronary artery calcification. Aortic Atherosclerosis (ICD10-I70.0). Electronically Signed   By: Helyn Numbers MD   On: 03/13/20 12:40   DG Chest Port 1V same Day  Result Date: 02/17/2020 CLINICAL DATA:  COVID-19 pneumonia, acute hypoxemic respiratory failure. EXAM: PORTABLE CHEST 1 VIEW COMPARISON:  2020-03-13. FINDINGS: The heart size and mediastinal contours are within normal limits. No pneumothorax or pleural effusion is noted. Bilateral patchy airspace opacities are noted consistent with multifocal pneumonia. The visualized skeletal structures are unremarkable. IMPRESSION: Bilateral patchy airspace opacities are noted consistent with multifocal pneumonia. Electronically Signed   By: Lupita Raider M.D.   On: 02/17/2020 08:55       Baani Bober S Karron Goens   [facility] If 7PM-7AM, please contact night-coverage at [email or web address], Office  [phone]   02/17/2020, 10:33 AM  LOS: 2 days

## 2020-02-17 NOTE — Procedures (Signed)
Central Venous Catheter Placement:TRIPLE LUMEN Indication: Patient receiving vesicant or irritant drug.; Patient receiving intravenous therapy for longer than 5 days.; Patient has limited or no vascular access.   Consent:From husband Mr Biller    Hand washing performed prior to starting the procedure.   Procedure:   An active timeout was performed and correct patient, name, & ID confirmed.   Patient was positioned correctly for central venous access.  Patient was prepped using strict sterile technique including chlorohexadine preps, sterile drape, sterile gown and sterile gloves.    The area was prepped, draped and anesthetized in the usual sterile manner. Patient comfort was obtained.    A triple lumen catheter was placed in Right  Internal Jugular Vein There was good blood return, catheter caps were placed on lumens, catheter flushed easily, the line was secured and a sterile dressing and BIO-PATCH applied.   Ultrasound was used to visualize vasculature and guidance of needle.   Number of Attempts: 1 Complications:none Estimated Blood Loss: none Chest Radiograph indicated and ordered.      Vida Rigger, M.D.  Pulmonary & Critical Care Medicine  [facility]

## 2020-02-17 NOTE — Procedures (Signed)
Endotracheal Intubation: Patient required placement of an artificial airway secondary to Respiratory Failure  Consent: Emergent.   Hand washing performed prior to starting the procedure.   Medications administered for sedation prior to procedure:  Rocuronium 40 mg IV, Fentanyl 150 mcg IV.    A time out procedure was called and correct patient, name, & ID confirmed. Needed supplies and equipment were assembled and checked to include ETT, 10 ml syringe, Glidescope, Mac and Miller blades, suction, oxygen and bag mask valve, end tidal CO2 monitor.   Patient was positioned to align the mouth and pharynx to facilitate visualization of the glottis.   Heart rate, SpO2 and blood pressure was continuously monitored during the procedure. Pre-oxygenation was conducted prior to intubation and endotracheal tube was placed through the vocal cords into the trachea.     The artificial airway was placed under direct visualization via glidescope route using a 7.5 ETT on the first attempt.  ETT was secured at 23 cm mark.  Placement was confirmed by auscuitation of lungs with good breath sounds bilaterally and no stomach sounds.  Condensation was noted on endotracheal tube.   Pulse ox 98%.  CO2 detector in place with appropriate color change.   Complications: None .     Chest radiograph ordered and pending.   Comments: OGT placed via glidescope.   Ottie Glazier, M.D.  Pulmonary & [place]

## 2020-02-18 ENCOUNTER — Encounter: Payer: Self-pay | Admitting: Internal Medicine

## 2020-02-18 LAB — COMPREHENSIVE METABOLIC PANEL
ALT: 12 U/L (ref 0–44)
AST: 29 U/L (ref 15–41)
Albumin: 2 g/dL — ABNORMAL LOW (ref 3.5–5.0)
Alkaline Phosphatase: 152 U/L — ABNORMAL HIGH (ref 38–126)
Anion gap: 10 (ref 5–15)
BUN: 30 mg/dL — ABNORMAL HIGH (ref 8–23)
CO2: 30 mmol/L (ref 22–32)
Calcium: 7.8 mg/dL — ABNORMAL LOW (ref 8.9–10.3)
Chloride: 103 mmol/L (ref 98–111)
Creatinine, Ser: 0.84 mg/dL (ref 0.44–1.00)
GFR, Estimated: >60 mL/min (ref >60)
Glucose, Bld: 101 mg/dL — ABNORMAL HIGH (ref 70–99)
Potassium: 4.5 mmol/L (ref 3.5–5.1)
Sodium: 143 mmol/L (ref 135–145)
Total Bilirubin: 0.3 mg/dL (ref 0.3–1.2)
Total Protein: 5.8 g/dL — ABNORMAL LOW (ref 6.5–8.1)

## 2020-02-18 LAB — FIBRIN DERIVATIVES D-DIMER (ARMC ONLY): Fibrin derivatives D-dimer (ARMC): 2441.28 ng/mL (FEU) — ABNORMAL HIGH (ref 0.00–499.00)

## 2020-02-18 LAB — CBC WITH DIFFERENTIAL/PLATELET
Abs Immature Granulocytes: 0.12 10*3/uL — ABNORMAL HIGH (ref 0.00–0.07)
Basophils Absolute: 0 10*3/uL (ref 0.0–0.1)
Basophils Relative: 0 %
Eosinophils Absolute: 0 10*3/uL (ref 0.0–0.5)
Eosinophils Relative: 0 %
HCT: 26.2 % — ABNORMAL LOW (ref 36.0–46.0)
Hemoglobin: 7.9 g/dL — ABNORMAL LOW (ref 12.0–15.0)
Immature Granulocytes: 1 %
Lymphocytes Relative: 2 %
Lymphs Abs: 0.3 10*3/uL — ABNORMAL LOW (ref 0.7–4.0)
MCH: 23.7 pg — ABNORMAL LOW (ref 26.0–34.0)
MCHC: 30.2 g/dL (ref 30.0–36.0)
MCV: 78.4 fL — ABNORMAL LOW (ref 80.0–100.0)
Monocytes Absolute: 0.4 10*3/uL (ref 0.1–1.0)
Monocytes Relative: 2 %
Neutro Abs: 16.7 10*3/uL — ABNORMAL HIGH (ref 1.7–7.7)
Neutrophils Relative %: 95 %
Platelets: 411 10*3/uL — ABNORMAL HIGH (ref 150–400)
RBC: 3.34 MIL/uL — ABNORMAL LOW (ref 3.87–5.11)
RDW: 17.7 % — ABNORMAL HIGH (ref 11.5–15.5)
WBC: 17.5 10*3/uL — ABNORMAL HIGH (ref 4.0–10.5)
nRBC: 0 % (ref 0.0–0.2)

## 2020-02-18 LAB — GLUCOSE, CAPILLARY
Glucose-Capillary: 79 mg/dL (ref 70–99)
Glucose-Capillary: 101 mg/dL — ABNORMAL HIGH (ref 70–99)

## 2020-02-18 LAB — PHOSPHORUS: Phosphorus: 5.5 mg/dL — ABNORMAL HIGH (ref 2.5–4.6)

## 2020-02-18 LAB — MAGNESIUM: Magnesium: 2.3 mg/dL (ref 1.7–2.4)

## 2020-02-18 LAB — C-REACTIVE PROTEIN: CRP: 19.5 mg/dL — ABNORMAL HIGH (ref <1.0)

## 2020-02-18 LAB — FERRITIN: Ferritin: 95 ng/mL (ref 11–307)

## 2020-02-18 MED ORDER — ASCORBIC ACID 500 MG PO TABS
500.0000 mg | ORAL_TABLET | Freq: Every day | ORAL | Status: DC
  Administered 2020-02-19 – 2020-02-28 (×10): 500 mg
  Filled 2020-02-18 (×10): qty 1

## 2020-02-18 MED ORDER — MIDAZOLAM HCL 2 MG/2ML IJ SOLN
1.0000 mg | INTRAMUSCULAR | Status: DC | PRN
  Administered 2020-02-25 (×2): 1 mg via INTRAVENOUS
  Filled 2020-02-18 (×2): qty 2

## 2020-02-18 MED ORDER — DOCUSATE SODIUM 50 MG/5ML PO LIQD
100.0000 mg | Freq: Two times a day (BID) | ORAL | Status: DC
  Administered 2020-02-18 – 2020-02-28 (×20): 100 mg
  Filled 2020-02-18 (×19): qty 10

## 2020-02-18 MED ORDER — CHLORHEXIDINE GLUCONATE 0.12% ORAL RINSE (MEDLINE KIT)
15.0000 mL | Freq: Two times a day (BID) | OROMUCOSAL | Status: DC
  Administered 2020-02-18 – 2020-02-29 (×22): 15 mL via OROMUCOSAL

## 2020-02-18 MED ORDER — POLYETHYLENE GLYCOL 3350 17 G PO PACK
17.0000 g | PACK | Freq: Every day | ORAL | Status: DC
  Administered 2020-02-18 – 2020-02-28 (×10): 17 g
  Filled 2020-02-18 (×10): qty 1

## 2020-02-18 MED ORDER — TRAMADOL HCL 50 MG PO TABS: 50.0000 mg | ORAL_TABLET | Freq: Four times a day (QID) | ORAL | Status: DC | PRN

## 2020-02-18 MED ORDER — LEVOTHYROXINE SODIUM 50 MCG PO TABS
50.0000 ug | ORAL_TABLET | Freq: Every day | ORAL | Status: DC
  Administered 2020-02-19 – 2020-02-29 (×11): 50 ug
  Filled 2020-02-18 (×11): qty 1

## 2020-02-18 MED ORDER — GUAIFENESIN 100 MG/5ML PO SOLN
20.0000 mL | ORAL | Status: DC
  Administered 2020-02-19 – 2020-02-28 (×57): 400 mg
  Filled 2020-02-18 (×63): qty 20

## 2020-02-18 MED ORDER — ORAL CARE MOUTH RINSE
15.0000 mL | OROMUCOSAL | Status: DC
  Administered 2020-02-18 – 2020-02-29 (×105): 15 mL via OROMUCOSAL

## 2020-02-18 MED ORDER — AMITRIPTYLINE HCL 25 MG PO TABS
25.0000 mg | ORAL_TABLET | Freq: Every day | ORAL | Status: DC
  Administered 2020-02-19 – 2020-02-28 (×10): 25 mg
  Filled 2020-02-18 (×11): qty 1

## 2020-02-18 MED ORDER — BARICITINIB 2 MG PO TABS
4.0000 mg | ORAL_TABLET | Freq: Every day | ORAL | Status: DC
  Administered 2020-02-19 – 2020-02-27 (×9): 4 mg
  Filled 2020-02-18 (×9): qty 2

## 2020-02-18 MED ORDER — MIDAZOLAM HCL 2 MG/2ML IJ SOLN
INTRAMUSCULAR | Status: AC
  Filled 2020-02-18: qty 2

## 2020-02-18 MED ORDER — MIDAZOLAM HCL 2 MG/2ML IJ SOLN
2.0000 mg | Freq: Once | INTRAMUSCULAR | Status: AC
  Administered 2020-02-18: 2 mg via INTRAVENOUS

## 2020-02-18 MED ORDER — MIDAZOLAM HCL 2 MG/2ML IJ SOLN
1.0000 mg | INTRAMUSCULAR | Status: AC | PRN
  Administered 2020-02-25 – 2020-02-29 (×3): 1 mg via INTRAVENOUS
  Filled 2020-02-18 (×4): qty 2

## 2020-02-18 MED ORDER — ZINC SULFATE 220 (50 ZN) MG PO CAPS
220.0000 mg | ORAL_CAPSULE | Freq: Every day | ORAL | Status: DC
  Administered 2020-02-19 – 2020-02-28 (×10): 220 mg
  Filled 2020-02-18 (×10): qty 1

## 2020-02-18 MED ORDER — ACETAMINOPHEN 160 MG/5ML PO SOLN
650.0000 mg | Freq: Four times a day (QID) | ORAL | Status: DC | PRN
  Administered 2020-02-20 – 2020-02-23 (×4): 650 mg
  Filled 2020-02-18 (×5): qty 20.3

## 2020-02-18 MED ORDER — PANTOPRAZOLE SODIUM 40 MG PO PACK
40.0000 mg | PACK | Freq: Every day | ORAL | Status: DC
  Administered 2020-02-19 – 2020-02-28 (×10): 40 mg
  Filled 2020-02-18 (×9): qty 20

## 2020-02-18 MED ORDER — PROPOFOL 1000 MG/100ML IV EMUL
INTRAVENOUS | Status: AC
  Filled 2020-02-18: qty 100

## 2020-02-18 MED ORDER — PROPOFOL 1000 MG/100ML IV EMUL
5.0000 ug/kg/min | INTRAVENOUS | Status: DC
  Administered 2020-02-18: 25 ug/kg/min via INTRAVENOUS
  Administered 2020-02-18 – 2020-02-19 (×4): 20 ug/kg/min via INTRAVENOUS
  Administered 2020-02-19 – 2020-02-20 (×2): 30 ug/kg/min via INTRAVENOUS
  Administered 2020-02-20 – 2020-02-21 (×3): 20 ug/kg/min via INTRAVENOUS
  Administered 2020-02-21: 30 ug/kg/min via INTRAVENOUS
  Administered 2020-02-21: 15 ug/kg/min via INTRAVENOUS
  Administered 2020-02-22 – 2020-02-23 (×5): 20 ug/kg/min via INTRAVENOUS
  Administered 2020-02-24: 30 ug/kg/min via INTRAVENOUS
  Administered 2020-02-24: 20 ug/kg/min via INTRAVENOUS
  Administered 2020-02-24 – 2020-02-26 (×7): 30 ug/kg/min via INTRAVENOUS
  Administered 2020-02-26 – 2020-02-28 (×7): 35 ug/kg/min via INTRAVENOUS
  Filled 2020-02-18 (×35): qty 100

## 2020-02-18 MED ORDER — PREGABALIN 50 MG PO CAPS
100.0000 mg | ORAL_CAPSULE | Freq: Two times a day (BID) | ORAL | Status: DC
  Administered 2020-02-19 – 2020-02-28 (×20): 100 mg
  Filled 2020-02-18 (×20): qty 2

## 2020-02-18 NOTE — Consult Note (Signed)
CRITICAL CARE PROGRESS NOTE    Name: Summer Holloway MRN: [id number] DOB: 06-16-1950     LOS: 3  Referring physician: Dr Darrick Meigs  SUBJECTIVE FINDINGS & SIGNIFICANT EVENTS    Patient description:   70 yo F hx of fibromyalgia, osteoarthritis, hypertension, hypothyroidism and GERD who was brought into the ER by EMS for evaluation of shortness of breath and cough.  Patient has been sick for about 2 weeks and had a positive COVID-19 home test on 02/04/20.   Per EMS patient was hypoxic in the field and was placed on a nonrebreather mask @ 15 L and was transported to the ER.  Patient was found to be encephalopathic with worsening SpO2.  Patient was brought to MICU due to acute hypoxemic respiratory failure worsening on HFNC.  I spoke to Mr Paolo ([place] husband) he was able to see patient throught glass door noted severe resp distress and wished for ETT placement and sedation.    02/18/2020- patient remains critically ill on MV wit PRVC, weaned FiO2 from 100 to 80% today.  Sedation was light patient up sitting awake we advanced RASS goal to -3 to -4 for patient comfort.   Lines/tubes : Airway 7.5 mm (Active)  Secured at (cm) 23 cm 02/17/20 1256  Measured From Lips 02/17/20 [place] 02/17/20 1256  Secured By [employer] 02/17/20 1256  Cuff Pressure (cm H2O) 27 cm H2O 02/17/20 1256     Closed System Drain Right Abdomen Bulb (JP) 19 Fr. (Active)     External Urinary Catheter (Active)  Collection Container Dedicated Suction Canister 02/17/20 [phone]  Site Assessment Clean;Intact 02/17/20 0855  Intervention Other (Comment) 02/17/20 0855    Microbiology/Sepsis markers: Results for orders placed or performed during the hospital encounter of 02/18/2020  SARS CORONAVIRUS 2 (TAT 6-24 HRS)      Status: Abnormal   Collection Time: 03/04/2020  9:57 AM  Result Value Ref Range Status   SARS Coronavirus 2 POSITIVE (A) NEGATIVE Final    Comment: (NOTE) SARS-CoV-2 target nucleic acids are DETECTED.  The SARS-CoV-2 RNA is generally detectable in upper and lower respiratory specimens during the acute phase of infection. Positive results are indicative of the presence of SARS-CoV-2 RNA. Clinical correlation with patient history and other diagnostic information is  necessary to determine patient infection status. Positive results do not rule out bacterial infection or co-infection with other viruses.  The expected result is Negative.  Fact Sheet for Patients: SugarRoll.be  Fact Sheet for Healthcare Providers: https://www.woods-mathews.com/  This test is not yet approved or cleared by the [place] FDA and  has been authorized for detection and/or diagnosis of SARS-CoV-2 by FDA under an Emergency Use Authorization (EUA). This EUA will remain  in effect (meaning this test can be used) for the duration of the COVID-19 declaration under Section 564(b)(1) of the Act, 21 U. S.C. section 360bbb-3(b)(1), unless the authorization is terminated or revoked sooner.   Performed at [facility], [place] [address] [place], [place] [address]   Blood culture (single)     Status: None (Preliminary result)   Collection Time: 03/05/2020 10:15 AM   Specimen: BLOOD  Result Value Ref Range Status   Specimen Description BLOOD LEFT AC  Final   Special Requests   Final    BOTTLES DRAWN AEROBIC AND ANAEROBIC Blood Culture results may not be optimal due to an inadequate volume of blood received in culture bottles   Culture   Final  NO GROWTH 3 DAYS Performed at [facility] [address] [place], [place] [address]    Report Status PENDING  Incomplete  Culture, blood (routine x 2) Call MD if unable to obtain prior to antibiotics being given      Status: None (Preliminary result)   Collection Time: 03/08/2020  9:38 PM   Specimen: BLOOD  Result Value Ref Range Status   Specimen Description BLOOD LEFT ANTECUBITAL  Final   Special Requests   Final    BOTTLES DRAWN AEROBIC AND ANAEROBIC Blood Culture results may not be optimal due to an inadequate volume of blood received in culture bottles   Culture   Final    NO GROWTH 3 DAYS Performed at [facility], [address] [place], [place] [address]    Report Status PENDING  Incomplete  Culture, blood (routine x 2) Call MD if unable to obtain prior to antibiotics being given     Status: None (Preliminary result)   Collection Time: 02/27/2020  9:38 PM   Specimen: BLOOD  Result Value Ref Range Status   Specimen Description BLOOD BLOOD RIGHT FOREARM  Final   Special Requests   Final    BOTTLES DRAWN AEROBIC AND ANAEROBIC Blood Culture adequate volume   Culture   Final    NO GROWTH 3 DAYS Performed at [facility], [address], [place],  [address]    Report Status PENDING  Incomplete    Anti-infectives:  Anti-infectives (From admission, onward)   Start     Dose/Rate Route Frequency Ordered Stop   02/17/20 1000  remdesivir 100 mg in sodium chloride 0.9 % 100 mL IVPB       "Followed by" Linked Group Details   100 mg 200 mL/hr over 30 Minutes Intravenous Daily 02/16/20 1147 02/21/20 0959   02/16/20 1245  remdesivir 200 mg in sodium chloride 0.9% 250 mL IVPB       "Followed by" Linked Group Details   200 mg 580 mL/hr over 30 Minutes Intravenous Once 02/16/20 1147 02/16/20 2219   02/16/20 1000  remdesivir 100 mg in sodium chloride 0.9 % 100 mL IVPB  Status:  Discontinued       "Followed by" Linked Group Details   100 mg 200 mL/hr over 30 Minutes Intravenous Daily 03/05/2020 1135 03/05/2020 2102   02/16/20 1000  remdesivir 100 mg in sodium chloride 0.9 % 100 mL IVPB  Status:  Discontinued       "Followed by" Linked Group Details   100 mg 200 mL/hr over 30  Minutes Intravenous Daily 03/10/2020 1343 03/11/2020 1352   03/12/2020 2200  levofloxacin (LEVAQUIN) IVPB 750 mg  Status:  Discontinued        750 mg 100 mL/hr over 90 Minutes Intravenous Every 24 hours 03/03/2020 2107 03/07/2020 2117   02/14/2020 2200  cefTRIAXone (ROCEPHIN) 1 g in sodium chloride 0.9 % 100 mL IVPB  Status:  Discontinued        1 g 200 mL/hr over 30 Minutes Intravenous Every 24 hours 02/14/2020 2117 02/16/20 0231   02/24/2020 2130  azithromycin (ZITHROMAX) 500 mg in sodium chloride 0.9 % 250 mL IVPB  Status:  Discontinued        500 mg 250 mL/hr over 60 Minutes Intravenous Every 24 hours 02/23/2020 2117 02/16/20 0231   03/04/2020 1415  remdesivir 200 mg in sodium chloride 0.9% 250 mL IVPB  Status:  Discontinued       "Followed by" Linked Group Details   200  mg 580 mL/hr over 30 Minutes Intravenous Once 02/24/2020 1343 03/01/2020 1352   03/09/2020 1300  remdesivir 200 mg in sodium chloride 0.9% 250 mL IVPB  Status:  Discontinued       "Followed by" Linked Group Details   200 mg 580 mL/hr over 30 Minutes Intravenous Once 02/14/2020 1135 02/26/2020 2102       PAST MEDICAL HISTORY   Past Medical History:  Diagnosis Date  . Anxiety   . Bursitis of left hip   . Depression   . Dysrhythmia    diagnosed pvc 1980  . Esophageal reflux   . Fibromyalgia   . Hearing loss   . Hyperlipemia   . Hyperlipidemia   . Hypertension   . Hypothyroidism   . Multiple sclerosis ([place])   . Osteoarthritis   . PVC (premature ventricular contraction)   . Sciatica   . Tinnitus   . Uterine prolapse      SURGICAL HISTORY   Past Surgical History:  Procedure Laterality Date  . BREAST BIOPSY  05/24/2002  . BREAST BIOPSY Left 05/30/2016   negative  . CATARACT EXTRACTION, BILATERAL    . CHOLECYSTECTOMY N/A 03/24/2019   Procedure: LAPAROSCOPIC CHOLECYSTECTOMY;  Surgeon: Clovis Riley, MD;  Location: [facility];  Service: General;  Laterality: N/A;  . PARTIAL HYSTERECTOMY    . surgical mesh     of uterine  .  vaginal hemorrhoid surgery        FAMILY HISTORY   Family History  Problem Relation Age of Onset  . Fibromyalgia Mother   . Anxiety disorder Mother   . Heart attack Father 70  . CAD Father   . Heart attack Cousin      SOCIAL HISTORY   Social History   Tobacco Use  . Smoking status: Never Smoker  . Smokeless tobacco: Never Used  Vaping Use  . Vaping Use: Never used  Substance Use Topics  . Alcohol use: No  . Drug use: Never     MEDICATIONS   Current Medication:  Current Facility-Administered Medications:  .  0.9 %  sodium chloride infusion, 250 mL, Intravenous, PRN, Agbata, Tochukwu, MD, Stopping Infusion hung by another clincian at 02/16/20 0318 .  acetaminophen (TYLENOL) tablet 650 mg, 650 mg, Oral, Q6H PRN, Agbata, Tochukwu, MD, 650 mg at 02/16/20 0337 .  albuterol (VENTOLIN HFA) 108 (90 Base) MCG/ACT inhaler 2 puff, 2 puff, Inhalation, Q6H, Agbata, Tochukwu, MD, 2 puff at 02/17/20 0919 .  ALPRAZolam (XANAX) tablet 0.25 mg, 0.25 mg, Oral, QHS PRN, Agbata, Tochukwu, MD, 0.25 mg at 02/17/20 2225 .  amitriptyline (ELAVIL) tablet 25 mg, 25 mg, Oral, QHS, Agbata, Tochukwu, MD, 25 mg at 02/17/20 1501 .  ascorbic acid (VITAMIN C) tablet 500 mg, 500 mg, Oral, Daily, Agbata, Tochukwu, MD, 500 mg at 02/18/20 0750 .  baricitinib (OLUMIANT) tablet 4 mg, 4 mg, Oral, Daily, Lu Duffel, RPH, 4 mg at 02/18/20 0751 .  buPROPion (WELLBUTRIN XL) 24 hr tablet 300 mg, 300 mg, Oral, Daily, Agbata, Tochukwu, MD, 300 mg at 02/18/20 0753 .  Chlorhexidine Gluconate Cloth 2 % PADS 6 each, 6 each, Topical, Daily, Ottie Glazier, MD, 6 each at 02/18/20 [phone]  dexamethasone (DECADRON) injection 6 mg, 6 mg, Intravenous, Q24H, Lanney Gins, Keneisha Heckart, MD, 6 mg at 02/17/20 1651 .  docusate (COLACE) 50 MG/5ML liquid 100 mg, 100 mg, Per Tube, BID, Darel Hong D, NP, 100 mg at 02/18/20 0751 .  DULoxetine (CYMBALTA) DR capsule 60 mg, 60 mg, Oral, Daily, Agbata,  Tochukwu, MD, 60 mg at 02/18/20  0752 .  enoxaparin (LOVENOX) injection 40 mg, 40 mg, Subcutaneous, Q24H, Agbata, Tochukwu, MD, 40 mg at 02/17/20 1649 .  fentaNYL 2528mcg in NS 224mL (32mcg/ml) infusion-PREMIX, 0-400 mcg/hr, Intravenous, Continuous, Temperence Zenor, MD, Last Rate: 15 mL/hr at 02/18/20 0900, 150 mcg/hr at 02/18/20 0900 .  guaiFENesin (MUCINEX) 12 hr tablet 1,200 mg, 1,200 mg, Oral, BID, Darrick Meigs, Marge Duncans, MD, 1,200 mg at 02/18/20 0751 .  levothyroxine (SYNTHROID) tablet 50 mcg, 50 mcg, Oral, Q0600, Agbata, Tochukwu, MD, 50 mcg at 02/18/20 0559 .  midazolam (VERSED) 2 MG/2ML injection, , , ,  .  midazolam (VERSED) injection 1 mg, 1 mg, Intravenous, Q15 min PRN, Darel Hong D, NP .  midazolam (VERSED) injection 1 mg, 1 mg, Intravenous, Q2H PRN, Darel Hong D, NP .  ondansetron (ZOFRAN) tablet 4 mg, 4 mg, Oral, Q6H PRN **OR** ondansetron (ZOFRAN) injection 4 mg, 4 mg, Intravenous, Q6H PRN, Agbata, Tochukwu, MD .  pantoprazole (PROTONIX) EC tablet 40 mg, 40 mg, Oral, Daily, Agbata, Tochukwu, MD, 40 mg at 02/18/20 0752 .  polyethylene glycol (MIRALAX / GLYCOLAX) packet 17 g, 17 g, Per Tube, Daily, Darel Hong D, NP .  pregabalin (LYRICA) capsule 100 mg, 100 mg, Oral, BID, Agbata, Tochukwu, MD, 100 mg at 02/18/20 0752 .  propofol (DIPRIVAN) 1000 MG/100ML infusion, 5-80 mcg/kg/min, Intravenous, Titrated, Darel Hong D, NP, Last Rate: 9.7 mL/hr at 02/18/20 0900, 20 mcg/kg/min at 02/18/20 0900 .  propofol (DIPRIVAN) 1000 MG/100ML infusion, , , ,  .  [COMPLETED] remdesivir 200 mg in sodium chloride 0.9% 250 mL IVPB, 200 mg, Intravenous, Once, Stopping Infusion hung by another clincian at 02/16/20 2219 **FOLLOWED BY** remdesivir 100 mg in sodium chloride 0.9 % 100 mL IVPB, 100 mg, Intravenous, Daily, Oswald Hillock, MD, Stopped at 02/18/20 [phone] .  sodium chloride flush (NS) 0.9 % injection 3 mL, 3 mL, Intravenous, Q12H, Agbata, Tochukwu, MD, 3 mL at 02/18/20 0812 .  sodium chloride flush (NS) 0.9 % injection 3 mL, 3  mL, Intravenous, PRN, Agbata, Tochukwu, MD .  traMADol (ULTRAM) tablet 50 mg, 50 mg, Oral, Q6H PRN, Agbata, Tochukwu, MD .  zinc sulfate capsule 220 mg, 220 mg, Oral, Daily, Agbata, Tochukwu, MD, 220 mg at 02/18/20 4081    ALLERGIES   Pizza flavor and Penicillins    REVIEW OF SYSTEMS    10 point ROS done and is negative due to encephalopathy and hypoxemic respiratory failure.   PHYSICAL EXAMINATION   Vital Signs: Temp:  [97.5 F (36.4 C)-98.5 F (36.9 C)] 98 F (36.7 C) (02/05 0800) Pulse Rate:  [56-112] 69 (02/05 0900) Resp:  [12-29] 23 (02/05 0900) BP: (88-210)/(46-124) 88/55 (02/05 0900) SpO2:  [89 %-100 %] 92 % (02/05 0900) FiO2 (%):  [60 %-80 %] 80 % (02/05 0800)  GENERAL:Age appropirate in acute distress due to hypoxemia HEAD: Normocephalic, atraumatic.  EYES: Pupils equal, round, reactive to light.  No scleral icterus.  MOUTH: Moist mucosal membrane. NECK: Supple. No thyromegaly. No nodules. No JVD.  PULMONARY: rhonchorous breath sounds bilateraly CARDIOVASCULAR: S1 and S2. Regular rate and rhythm. No murmurs, rubs, or gallops.  GASTROINTESTINAL: Soft, nontender, non-distended. No masses. Positive bowel sounds. No hepatosplenomegaly.  MUSCULOSKELETAL: No swelling, clubbing, or edema.  NEUROLOGIC: Mild distress due to acute illness with encephalopathy in context of poss MS flare  SKIN:intact,warm,dry   PERTINENT DATA     Infusions: . sodium chloride Stopped (02/16/20 0318)  . fentaNYL infusion INTRAVENOUS 150 mcg/hr (02/18/20 0900)  .  propofol (DIPRIVAN) infusion 20 mcg/kg/min (02/18/20 0900)  . propofol    . remdesivir 100 mg in NS 100 mL Stopped (02/18/20 0837)   Scheduled Medications: . albuterol  2 puff Inhalation Q6H  . amitriptyline  25 mg Oral QHS  . vitamin C  500 mg Oral Daily  . baricitinib  4 mg Oral Daily  . buPROPion  300 mg Oral Daily  . Chlorhexidine Gluconate Cloth  6 each Topical Daily  . dexamethasone (DECADRON) injection  6 mg  Intravenous Q24H  . docusate  100 mg Per Tube BID  . DULoxetine  60 mg Oral Daily  . enoxaparin (LOVENOX) injection  40 mg Subcutaneous Q24H  . guaiFENesin  1,200 mg Oral BID  . levothyroxine  50 mcg Oral Q0600  . midazolam      . pantoprazole  40 mg Oral Daily  . polyethylene glycol  17 g Per Tube Daily  . pregabalin  100 mg Oral BID  . sodium chloride flush  3 mL Intravenous Q12H  . zinc sulfate  220 mg Oral Daily   PRN Medications: sodium chloride, acetaminophen, ALPRAZolam, midazolam, midazolam, ondansetron **OR** ondansetron (ZOFRAN) IV, sodium chloride flush, traMADol Hemodynamic parameters:   Intake/Output: 02/04 0701 - 02/05 0700 In: 908.5 [I.V.:608.5; NG/GT:200; IV Piggyback:100] Out: 835 [Urine:835]  Ventilator  Settings: Vent Mode: PRVC FiO2 (%):  [60 %-80 %] 80 % Set Rate:  [15 bmp] 15 bmp Vt Set:  [450 mL] 450 mL PEEP:  [0 cmH20-5 cmH20] 5 cmH20  LAB RESULTS:  Basic Metabolic Panel: Recent Labs  Lab 03/08/2020 1015 02/16/20 0443 02/17/20 1001 02/18/20 0526  NA 140 142 142 143  K 3.3* 4.2 4.0 4.5  CL 100 106 103 103  CO2 26 24 26 30   GLUCOSE 85 121* 76 101*  BUN 15 14 23  30*  CREATININE 0.79 0.68 0.81 0.84  CALCIUM 8.4* 7.9* 8.3* 7.8*  MG  --  2.0 2.0 2.3  PHOS  --  3.3 3.9 5.5*   Liver Function Tests: Recent Labs  Lab 03/08/2020 1015 02/16/20 0443 02/17/20 1001 02/18/20 0526  AST 41 39 45* 29  ALT 12 13 15 12   ALKPHOS 187* 154* 153* 152*  BILITOT 0.4 0.3 0.5 0.3  PROT 6.9 6.1* 6.7 5.8*  ALBUMIN 2.6* 2.1* 2.6* 2.0*   No results for input(s): LIPASE, AMYLASE in the last 168 hours. No results for input(s): AMMONIA in the last 168 hours. CBC: Recent Labs  Lab 02/23/2020 1015 02/16/20 0443 02/16/20 2201 02/17/20 1001 02/18/20 0526  WBC 14.7* 10.8*  --  19.8* 17.5*  NEUTROABS 13.5* 10.2*  --  18.9* 16.7*  HGB 10.3* 8.9* 9.7* 9.6* 7.9*  HCT 32.8* 27.7*  --  31.6* 26.2*  MCV 76.5* 78.2*  --  81.7 78.4*  PLT 395 406*  --  480* 411*    Cardiac Enzymes: No results for input(s): CKTOTAL, CKMB, CKMBINDEX, TROPONINI in the last 168 hours. BNP: Invalid input(s): POCBNP CBG: Recent Labs  Lab 02/17/20 0917 02/17/20 2009 02/18/20 0725  GLUCAP 72 131* 79       IMAGING RESULTS:  Imaging: DG Chest 1 View  Result Date: 02/17/2020 CLINICAL DATA:  ETT EXAM: CHEST  1 VIEW COMPARISON:  February 17, 2020 FINDINGS: The cardiomediastinal silhouette is unchanged in contour.The tip terminates 3.7 cm above the carina. Enteric tube side port projects over the proximal stomach. RIGHT IJ CVC tip terminates over the RIGHT atrium. No pleural effusion. No pneumothorax. Diffuse bilateral heterogeneous opacities, unchanged. Visualized abdomen is  unremarkable. No acute osseous abnormality IMPRESSION: 1.  Support apparatus as described above. 2. Unchanged diffuse bilateral heterogeneous opacities consistent with the sequela of COVID-19 infection. Electronically Signed   By: Valentino Saxon MD   On: 02/17/2020 15:27   DG Abd 1 View  Result Date: 02/17/2020 CLINICAL DATA:  Enteric tube placement EXAM: ABDOMEN - 1 VIEW COMPARISON:  Same day radiograph. FINDINGS: Incomplete assessment of the pelvis. RIGHT IJ CVC tip terminates over the RIGHT atrium. Enteric tube tip and side port project over the stomach. Diffuse bilateral heterogeneous airspace opacities consistent with COVID 19 infection. No dilated loops of bowel are seen. IMPRESSION: 1. Enteric tube tip and side port project over the stomach. Electronically Signed   By: Valentino Saxon MD   On: 02/17/2020 15:28   DG Chest Port 1V same Day  Result Date: 02/17/2020 CLINICAL DATA:  COVID-19 pneumonia, acute hypoxemic respiratory failure. EXAM: PORTABLE CHEST 1 VIEW COMPARISON:  February 15, 2020. FINDINGS: The heart size and mediastinal contours are within normal limits. No pneumothorax or pleural effusion is noted. Bilateral patchy airspace opacities are noted consistent with multifocal pneumonia.  The visualized skeletal structures are unremarkable. IMPRESSION: Bilateral patchy airspace opacities are noted consistent with multifocal pneumonia. Electronically Signed   By: Marijo Conception M.D.   On: 02/17/2020 08:55   '@PROBHOSP'$ @ DG Chest 1 View  Result Date: 02/17/2020 CLINICAL DATA:  ETT EXAM: CHEST  1 VIEW COMPARISON:  February 17, 2020 FINDINGS: The cardiomediastinal silhouette is unchanged in contour.The tip terminates 3.7 cm above the carina. Enteric tube side port projects over the proximal stomach. RIGHT IJ CVC tip terminates over the RIGHT atrium. No pleural effusion. No pneumothorax. Diffuse bilateral heterogeneous opacities, unchanged. Visualized abdomen is unremarkable. No acute osseous abnormality IMPRESSION: 1.  Support apparatus as described above. 2. Unchanged diffuse bilateral heterogeneous opacities consistent with the sequela of COVID-19 infection. Electronically Signed   By: Valentino Saxon MD   On: 02/17/2020 15:27   DG Abd 1 View  Result Date: 02/17/2020 CLINICAL DATA:  Enteric tube placement EXAM: ABDOMEN - 1 VIEW COMPARISON:  Same day radiograph. FINDINGS: Incomplete assessment of the pelvis. RIGHT IJ CVC tip terminates over the RIGHT atrium. Enteric tube tip and side port project over the stomach. Diffuse bilateral heterogeneous airspace opacities consistent with COVID 19 infection. No dilated loops of bowel are seen. IMPRESSION: 1. Enteric tube tip and side port project over the stomach. Electronically Signed   By: Valentino Saxon MD   On: 02/17/2020 15:28        ASSESSMENT AND PLAN    -Multidisciplinary rounds held today  Acute Hypoxic Respiratory Failure  Secondary to COVID19 -Remdesevir antiviral - pharmacy protocol 5 d -vitamin C -zinc -decadron $RemoveBeforeD'6mg'[id number]$  IV daily  -Diuresis - Lasix 40 IV daily - monitor UOP - utilize external urinary catheter if possible -Self prone if patient can tolerate  -encourage to use IS and Acapella device for bronchopulmonary  hygiene when able -d/c hepatotoxic medications while on remdesevir -supportive care with ICU telemetry monitoring -PT/OT when possible -procalcitonin, CRP and ferritin trending -S/p ETT -continue Bronchodilator Therapy -Wean Fio2 and PEEP as tolerated -will perform SAT/SBT when respiratory parameters are met    NEUROLOGY - intubated and sedated -patent has hx of MS - minimal sedation to achieve a RASS goal: -1 Wake up assessment pending                 note-patient takes Elavil, Lyrica, Wellbutrin, Xanax, Cymbalta, Provigil at home -  caution with centrally acting medications patient higher risk for extra pyramidal symptoms and adverse effects including EKG changes   Hypothyroidism   - continue home Levothyroxine - 18m daily  ID -continue IV abx as prescibed -follow up cultures  GI/Nutrition GI PROPHYLAXIS as indicated DIET-->TF's as tolerated Constipation protocol as indicated  ENDO - ICU hypoglycemic\Hyperglycemia protocol -check FSBS per protocol   ELECTROLYTES -follow labs as needed -replace as needed -pharmacy consultation   DVT/GI PRX ordered -SCDs  TRANSFUSIONS AS NEEDED MONITOR FSBS ASSESS the need for LABS as needed   Critical care provider statement:    Critical care time (minutes):  33   Critical care time was exclusive of:  Separately billable procedures and treating other patients   Critical care was necessary to treat or prevent imminent or life-threatening deterioration of the following conditions:  Acute hypoxemic respiratory faiulre due to COVID19, MS with possible acute exacerbation   Critical care was time spent personally by me on the following activities:  Development of treatment plan with patient or surrogate, discussions with consultants, evaluation of patient's response to treatment, examination of patient, obtaining history from patient or surrogate, ordering and performing treatments and interventions, ordering and review of laboratory  studies and re-evaluation of patient's condition.  I assumed direction of critical care for this patient from another provider in my specialty: no    This document was prepared using Dragon voice recognition software and may include unintentional dictation errors.    FOttie Glazier M.D.  Division of [facility]

## 2020-02-19 LAB — FIBRIN DERIVATIVES D-DIMER (ARMC ONLY): Fibrin derivatives D-dimer (ARMC): 3367.13 ng/mL (FEU) — ABNORMAL HIGH (ref 0.00–499.00)

## 2020-02-19 LAB — CBC WITH DIFFERENTIAL/PLATELET
Abs Immature Granulocytes: 0.05 10*3/uL (ref 0.00–0.07)
Basophils Absolute: 0 10*3/uL (ref 0.0–0.1)
Basophils Relative: 0 %
Eosinophils Absolute: 0 10*3/uL (ref 0.0–0.5)
Eosinophils Relative: 0 %
HCT: 26.9 % — ABNORMAL LOW (ref 36.0–46.0)
Hemoglobin: 8 g/dL — ABNORMAL LOW (ref 12.0–15.0)
Immature Granulocytes: 0 %
Lymphocytes Relative: 1 %
Lymphs Abs: 0.2 10*3/uL — ABNORMAL LOW (ref 0.7–4.0)
MCH: 23.6 pg — ABNORMAL LOW (ref 26.0–34.0)
MCHC: 29.7 g/dL — ABNORMAL LOW (ref 30.0–36.0)
MCV: 79.4 fL — ABNORMAL LOW (ref 80.0–100.0)
Monocytes Absolute: 0.3 10*3/uL (ref 0.1–1.0)
Monocytes Relative: 2 %
Neutro Abs: 14.7 10*3/uL — ABNORMAL HIGH (ref 1.7–7.7)
Neutrophils Relative %: 97 %
Platelets: 368 10*3/uL (ref 150–400)
RBC: 3.39 MIL/uL — ABNORMAL LOW (ref 3.87–5.11)
RDW: 17.9 % — ABNORMAL HIGH (ref 11.5–15.5)
WBC: 15.2 10*3/uL — ABNORMAL HIGH (ref 4.0–10.5)
nRBC: 0 % (ref 0.0–0.2)

## 2020-02-19 LAB — COMPREHENSIVE METABOLIC PANEL
ALT: 11 U/L (ref 0–44)
AST: 29 U/L (ref 15–41)
Albumin: 2 g/dL — ABNORMAL LOW (ref 3.5–5.0)
Alkaline Phosphatase: 138 U/L — ABNORMAL HIGH (ref 38–126)
Anion gap: 9 (ref 5–15)
BUN: 37 mg/dL — ABNORMAL HIGH (ref 8–23)
CO2: 30 mmol/L (ref 22–32)
Calcium: 8 mg/dL — ABNORMAL LOW (ref 8.9–10.3)
Chloride: 103 mmol/L (ref 98–111)
Creatinine, Ser: 0.98 mg/dL (ref 0.44–1.00)
GFR, Estimated: >60 mL/min (ref >60)
Glucose, Bld: 119 mg/dL — ABNORMAL HIGH (ref 70–99)
Potassium: 4.5 mmol/L (ref 3.5–5.1)
Sodium: 142 mmol/L (ref 135–145)
Total Bilirubin: 0.5 mg/dL (ref 0.3–1.2)
Total Protein: 5.5 g/dL — ABNORMAL LOW (ref 6.5–8.1)

## 2020-02-19 LAB — FERRITIN: Ferritin: 136 ng/mL (ref 11–307)

## 2020-02-19 LAB — GLUCOSE, CAPILLARY
Glucose-Capillary: 105 mg/dL — ABNORMAL HIGH (ref 70–99)
Glucose-Capillary: 131 mg/dL — ABNORMAL HIGH (ref 70–99)
Glucose-Capillary: 82 mg/dL (ref 70–99)
Glucose-Capillary: 85 mg/dL (ref 70–99)
Glucose-Capillary: 93 mg/dL (ref 70–99)

## 2020-02-19 LAB — MAGNESIUM: Magnesium: 2.7 mg/dL — ABNORMAL HIGH (ref 1.7–2.4)

## 2020-02-19 LAB — TRIGLYCERIDES: Triglycerides: 72 mg/dL (ref <150)

## 2020-02-19 LAB — PHOSPHORUS: Phosphorus: 4.9 mg/dL — ABNORMAL HIGH (ref 2.5–4.6)

## 2020-02-19 MED ORDER — VITAL HIGH PROTEIN PO LIQD
1000.0000 mL | ORAL | Status: DC
  Administered 2020-02-19: 1000 mL

## 2020-02-19 MED ORDER — PROSOURCE TF PO LIQD
45.0000 mL | Freq: Two times a day (BID) | ORAL | Status: DC
  Administered 2020-02-19 – 2020-02-20 (×2): 45 mL
  Filled 2020-02-19: qty 45

## 2020-02-19 NOTE — Progress Notes (Signed)
CRITICAL CARE PROGRESS NOTE    Name: Summer Holloway MRN: [id number] DOB: [birth year]     LOS: 4  Referring physician: Dr Darrick Meigs  SUBJECTIVE FINDINGS & SIGNIFICANT EVENTS    Patient description:   70 yo F hx of fibromyalgia, osteoarthritis, hypertension, hypothyroidism and GERD who was brought into the ER by EMS for evaluation of shortness of breath and cough.  Patient has been sick for about 2 weeks and had a positive COVID-19 home test on 02/04/20.   Per EMS patient was hypoxic in the field and was placed on a nonrebreather mask @ 15 L and was transported to the ER.  Patient was found to be encephalopathic with worsening SpO2.  Patient was brought to MICU due to acute hypoxemic respiratory failure worsening on HFNC.  I spoke to Mr Rylander ([place] husband) he was able to see patient throught glass door noted severe resp distress and wished for ETT placement and sedation.    02/18/2020- patient remains critically ill on MV wit PRVC, weaned FiO2 from 100 to 80% today.  Sedation was light patient up sitting awake we advanced RASS goal to -3 to -4 for patient comfort.   02/19/2020- patient weaned to 70% today, no events today.  Met with husband and reviewed hospital course, answered questions.  He is happy and thankful for care.    Lines/tubes : Airway 7.5 mm (Active)  Secured at (cm) 23 cm 02/17/20 1256  Measured From Lips 02/17/20 [place] 02/17/20 1256  Secured By [employer] 02/17/20 1256  Cuff Pressure (cm H2O) 27 cm H2O 02/17/20 1256     Closed System Drain Right Abdomen Bulb (JP) 19 Fr. (Active)     External Urinary Catheter (Active)  Collection Container Dedicated Suction Canister 02/17/20 [phone]  Site Assessment Clean;Intact 02/17/20 0855  Intervention Other (Comment) 02/17/20  0855    Microbiology/Sepsis markers: Results for orders placed or performed during the hospital encounter of 03/06/2020  SARS CORONAVIRUS 2 (TAT 6-24 HRS)     Status: Abnormal   Collection Time: 03/03/2020  9:57 AM  Result Value Ref Range Status   SARS Coronavirus 2 POSITIVE (A) NEGATIVE Final    Comment: (NOTE) SARS-CoV-2 target nucleic acids are DETECTED.  The SARS-CoV-2 RNA is generally detectable in upper and lower respiratory specimens during the acute phase of infection. Positive results are indicative of the presence of SARS-CoV-2 RNA. Clinical correlation with patient history and other diagnostic information is  necessary to determine patient infection status. Positive results do not rule out bacterial infection or co-infection with other viruses.  The expected result is Negative.  Fact Sheet for Patients: SugarRoll.be  Fact Sheet for Healthcare Providers: https://www.woods-mathews.com/  This test is not yet approved or cleared by the [place] FDA and  has been authorized for detection and/or diagnosis of SARS-CoV-2 by FDA under an Emergency Use Authorization (EUA). This EUA will remain  in effect (meaning this test can be used) for the duration of the COVID-19 declaration under Section 564(b)(1) of the Act, 21 U. S.C. section 360bbb-3(b)(1), unless the authorization is terminated or revoked sooner.   Performed at [facility], [place] [address], [place], [place] [address]   Blood culture (single)     Status: None (Preliminary result)   Collection Time: 03/12/2020 10:15 AM   Specimen: BLOOD  Result Value Ref Range Status   Specimen Description BLOOD LEFT [facility]  Final   Special Requests   Final    BOTTLES  DRAWN AEROBIC AND ANAEROBIC Blood Culture results may not be optimal due to an inadequate volume of blood received in culture bottles   Culture   Final    NO GROWTH 4 DAYS Performed at [facility], [address], [place], [place] [address]    Report Status PENDING  Incomplete  Culture, blood (routine x 2) Call MD if unable to obtain prior to antibiotics being given     Status: None (Preliminary result)   Collection Time: 02/23/2020  9:38 PM   Specimen: BLOOD  Result Value Ref Range Status   Specimen Description BLOOD LEFT ANTECUBITAL  Final   Special Requests   Final    BOTTLES DRAWN AEROBIC AND ANAEROBIC Blood Culture results may not be optimal due to an inadequate volume of blood received in culture bottles   Culture   Final    NO GROWTH 4 DAYS Performed at [facility], [address], [place], [place] [address]    Report Status PENDING  Incomplete  Culture, blood (routine x 2) Call MD if unable to obtain prior to antibiotics being given     Status: None (Preliminary result)   Collection Time: 02/26/2020  9:38 PM   Specimen: BLOOD  Result Value Ref Range Status   Specimen Description BLOOD BLOOD RIGHT FOREARM  Final   Special Requests   Final    BOTTLES DRAWN AEROBIC AND ANAEROBIC Blood Culture adequate volume   Culture   Final    NO GROWTH 4 DAYS Performed at [facility], [address] [place], [place] [address]    Report Status PENDING  Incomplete  Culture, respiratory (non-expectorated)     Status: None (Preliminary result)   Collection Time: 02/18/20  1:49 PM   Specimen: Tracheal Aspirate; Respiratory  Result Value Ref Range Status   Specimen Description   Final    TRACHEAL ASPIRATE Performed at [facility], [address], [place], [place] [address]    Special Requests   Final    NONE Performed at [facility], [place], [place] [address]    Gram Stain   Final    ABUNDANT WBC PRESENT, PREDOMINANTLY PMN RARE GRAM POSITIVE COCCI Performed at [facility], [place] [address] [place], [place] [address]    Culture PENDING  Incomplete   Report Status PENDING  Incomplete    Anti-infectives:  Anti-infectives (From  admission, onward)   Start     Dose/Rate Route Frequency Ordered Stop   02/17/20 1000  remdesivir 100 mg in sodium chloride 0.9 % 100 mL IVPB       "Followed by" Linked Group Details   100 mg 200 mL/hr over 30 Minutes Intravenous Daily 02/16/20 1147 02/21/20 0959   02/16/20 1245  remdesivir 200 mg in sodium chloride 0.9% 250 mL IVPB       "Followed by" Linked Group Details   200 mg 580 mL/hr over 30 Minutes Intravenous Once 02/16/20 1147 02/16/20 2219   02/16/20 1000  remdesivir 100 mg in sodium chloride 0.9 % 100 mL IVPB  Status:  Discontinued       "Followed by" Linked Group Details   100 mg 200 mL/hr over 30 Minutes Intravenous Daily 03/04/2020 1135 02/20/2020 2102   02/16/20 1000  remdesivir 100 mg in sodium chloride 0.9 % 100 mL IVPB  Status:  Discontinued       "Followed by" Linked Group Details   100 mg 200 mL/hr over 30 Minutes Intravenous Daily 02/27/2020  1343 02/24/2020 1352   02/27/2020 2200  levofloxacin (LEVAQUIN) IVPB 750 mg  Status:  Discontinued        750 mg 100 mL/hr over 90 Minutes Intravenous Every 24 hours 02/24/2020 2107 03/09/2020 2117   03/03/2020 2200  cefTRIAXone (ROCEPHIN) 1 g in sodium chloride 0.9 % 100 mL IVPB  Status:  Discontinued        1 g 200 mL/hr over 30 Minutes Intravenous Every 24 hours 02/17/2020 2117 02/16/20 0231   02/14/2020 2130  azithromycin (ZITHROMAX) 500 mg in sodium chloride 0.9 % 250 mL IVPB  Status:  Discontinued        500 mg 250 mL/hr over 60 Minutes Intravenous Every 24 hours 03/06/2020 2117 02/16/20 0231   02/17/2020 1415  remdesivir 200 mg in sodium chloride 0.9% 250 mL IVPB  Status:  Discontinued       "Followed by" Linked Group Details   200 mg 580 mL/hr over 30 Minutes Intravenous Once 02/14/2020 1343 02/14/2020 1352   02/22/2020 1300  remdesivir 200 mg in sodium chloride 0.9% 250 mL IVPB  Status:  Discontinued       "Followed by" Linked Group Details   200 mg 580 mL/hr over 30 Minutes Intravenous Once 03/08/2020 1135 02/22/2020 2102       PAST MEDICAL  HISTORY   Past Medical History:  Diagnosis Date  . Anxiety   . Bursitis of left hip   . Depression   . Dysrhythmia    diagnosed pvc 1980  . Esophageal reflux   . Fibromyalgia   . Hearing loss   . Hyperlipemia   . Hyperlipidemia   . Hypertension   . Hypothyroidism   . Multiple sclerosis ([place])   . Osteoarthritis   . PVC (premature ventricular contraction)   . Sciatica   . Tinnitus   . Uterine prolapse      SURGICAL HISTORY   Past Surgical History:  Procedure Laterality Date  . BREAST BIOPSY  05/24/2002  . BREAST BIOPSY Left 05/30/2016   negative  . CATARACT EXTRACTION, BILATERAL    . CHOLECYSTECTOMY N/A 03/24/2019   Procedure: LAPAROSCOPIC CHOLECYSTECTOMY;  Surgeon: Clovis Riley, MD;  Location: [facility];  Service: General;  Laterality: N/A;  . PARTIAL HYSTERECTOMY    . surgical mesh     of uterine  . vaginal hemorrhoid surgery        FAMILY HISTORY   Family History  Problem Relation Age of Onset  . Fibromyalgia Mother   . Anxiety disorder Mother   . Heart attack Father 23  . CAD Father   . Heart attack Cousin      SOCIAL HISTORY   Social History   Tobacco Use  . Smoking status: Never Smoker  . Smokeless tobacco: Never Used  Vaping Use  . Vaping Use: Never used  Substance Use Topics  . Alcohol use: No  . Drug use: Never     MEDICATIONS   Current Medication:  Current Facility-Administered Medications:  .  0.9 %  sodium chloride infusion, 250 mL, Intravenous, PRN, Agbata, Tochukwu, MD, Stopping Infusion hung by another clincian at 02/16/20 0318 .  acetaminophen (TYLENOL) 160 MG/5ML solution 650 mg, 650 mg, Per Tube, Q6H PRN, Lanney Gins, Nader Boys, MD .  albuterol (VENTOLIN HFA) 108 (90 Base) MCG/ACT inhaler 2 puff, 2 puff, Inhalation, Q6H, Agbata, Tochukwu, MD, 2 puff at 02/17/20 0919 .  amitriptyline (ELAVIL) tablet 25 mg, 25 mg, Per Tube, QHS, Kavish Lafitte, MD .  ascorbic acid (VITAMIN C) tablet 500  mg, 500 mg, Per Tube, Daily, Lanney Gins,  Jaishawn Witzke, MD, 500 mg at 02/19/20 0826 .  baricitinib (OLUMIANT) tablet 4 mg, 4 mg, Per Tube, Daily, Ottie Glazier, MD, 4 mg at 02/19/20 0825 .  buPROPion (WELLBUTRIN XL) 24 hr tablet 300 mg, 300 mg, Oral, Daily, Jearldean Gutt, MD, 300 mg at 02/19/20 0828 .  chlorhexidine gluconate (MEDLINE KIT) (PERIDEX) 0.12 % solution 15 mL, 15 mL, Mouth Rinse, BID, Lanney Gins, Leoni Goodness, MD, 15 mL at 02/19/20 0755 .  Chlorhexidine Gluconate Cloth 2 % PADS 6 each, 6 each, Topical, Daily, Ottie Glazier, MD, 6 each at 02/18/20 [phone] .  dexamethasone (DECADRON) injection 6 mg, 6 mg, Intravenous, Q24H, Lanney Gins, Kalee Broxton, MD, 6 mg at 02/18/20 1653 .  docusate (COLACE) 50 MG/5ML liquid 100 mg, 100 mg, Per Tube, BID, Darel Hong D, NP, 100 mg at 02/19/20 0827 .  DULoxetine (CYMBALTA) DR capsule 60 mg, 60 mg, Oral, Daily, Nicolai Labonte, MD, 60 mg at 02/19/20 0827 .  fentaNYL 2564mg in NS 2537m(1068mml) infusion-PREMIX, 0-400 mcg/hr, Intravenous, Continuous, Karrington Studnicka, MD, Last Rate: 22.5 mL/hr at 02/19/20 0824, 225 mcg/hr at 02/19/20 0824 .  guaiFENesin (ROBITUSSIN) 100 MG/5ML solution 400 mg, 20 mL, Per Tube, Q4H, Blonnie Maske, MD, 400 mg at 02/19/20 0830 .  levothyroxine (SYNTHROID) tablet 50 mcg, 50 mcg, Per Tube, Q0600, AleOttie GlazierD, 50 mcg at 02/19/20 0525 .  MEDLINE mouth rinse, 15 mL, Mouth Rinse, 10 times per day, AleOttie GlazierD, 15 mL at 02/19/20 0606 .  midazolam (VERSED) injection 1 mg, 1 mg, Intravenous, Q15 min PRN, KeeDarel Hong NP .  midazolam (VERSED) injection 1 mg, 1 mg, Intravenous, Q2H PRN, KeeDarel Hong NP .  ondansetron (ZOFRAN) tablet 4 mg, 4 mg, Oral, Q6H PRN **OR** ondansetron (ZOFRAN) injection 4 mg, 4 mg, Intravenous, Q6H PRN, Agbata, Tochukwu, MD .  pantoprazole sodium (PROTONIX) 40 mg/20 mL oral suspension 40 mg, 40 mg, Per Tube, Daily, Taimane Stimmel, MD .  polyethylene glycol (MIRALAX / GLYCOLAX) packet 17 g, 17 g, Per Tube, Daily, KeeDarel Hong NP, 17 g  at 02/19/20 0826 .  pregabalin (LYRICA) capsule 100 mg, 100 mg, Per Tube, BID, Martisha Toulouse, MD, 100 mg at 02/19/20 0826 .  propofol (DIPRIVAN) 1000 MG/100ML infusion, 5-80 mcg/kg/min, Intravenous, Titrated, KeeDarel Hong NP, Last Rate: 9.7 mL/hr at 02/19/20 0800, 20 mcg/kg/min at 02/19/20 0800 .  [COMPLETED] remdesivir 200 mg in sodium chloride 0.9% 250 mL IVPB, 200 mg, Intravenous, Once, Stopping Infusion hung by another clincian at 02/16/20 2219 **FOLLOWED BY** remdesivir 100 mg in sodium chloride 0.9 % 100 mL IVPB, 100 mg, Intravenous, Daily, LamDarrick MeigsagMarge DuncansD, Last Rate: 200 mL/hr at 02/19/20 0831, 100 mg at 02/19/20 0831 .  sodium chloride flush (NS) 0.9 % injection 3 mL, 3 mL, Intravenous, Q12H, Agbata, Tochukwu, MD, 3 mL at 02/19/20 0054 .  sodium chloride flush (NS) 0.9 % injection 3 mL, 3 mL, Intravenous, PRN, Agbata, Tochukwu, MD .  traMADol (ULTRAM) tablet 50 mg, 50 mg, Per Tube, Q6H PRN, AleLanney Ginsuad, MD .  zinc sulfate capsule 220 mg, 220 mg, Per Tube, Daily, AleLanney Ginsuad, MD, 220 mg at 02/19/20 [id number] ALLERGIES   Pizza flavor and Penicillins    REVIEW OF SYSTEMS    10 point ROS done and is negative due to encephalopathy and hypoxemic respiratory failure.   PHYSICAL EXAMINATION   Vital Signs: Temp:  [96.9 F (36.1 C)-98.5 F (36.9 C)] 96.9 F (36.1 C) (02/06 0755)  Pulse Rate:  [68-95] 68 (02/06 0600) Resp:  [7-25] 15 (02/06 0600) BP: (92-116)/(53-61) 102/55 (02/06 0600) SpO2:  [89 %-97 %] 92 % (02/06 0747) FiO2 (%):  [65 %-100 %] 80 % (02/06 0754)  GENERAL:Age appropirate in acute distress due to hypoxemia HEAD: Normocephalic, atraumatic.  EYES: Pupils equal, round, reactive to light.  No scleral icterus.  MOUTH: Moist mucosal membrane. NECK: Supple. No thyromegaly. No nodules. No JVD.  PULMONARY: rhonchorous breath sounds bilateraly CARDIOVASCULAR: S1 and S2. Regular rate and rhythm. No murmurs, rubs, or gallops.  GASTROINTESTINAL: Soft,  nontender, non-distended. No masses. Positive bowel sounds. No hepatosplenomegaly.  MUSCULOSKELETAL: No swelling, clubbing, or edema.  NEUROLOGIC: Mild distress due to acute illness with encephalopathy in context of poss MS flare  SKIN:intact,warm,dry   PERTINENT DATA     Infusions: . sodium chloride Stopped (02/16/20 0318)  . fentaNYL infusion INTRAVENOUS 225 mcg/hr (02/19/20 0824)  . propofol (DIPRIVAN) infusion 20 mcg/kg/min (02/19/20 0800)  . remdesivir 100 mg in NS 100 mL 100 mg (02/19/20 0831)   Scheduled Medications: . albuterol  2 puff Inhalation Q6H  . amitriptyline  25 mg Per Tube QHS  . vitamin C  500 mg Per Tube Daily  . baricitinib  4 mg Per Tube Daily  . buPROPion  300 mg Oral Daily  . chlorhexidine gluconate (MEDLINE KIT)  15 mL Mouth Rinse BID  . Chlorhexidine Gluconate Cloth  6 each Topical Daily  . dexamethasone (DECADRON) injection  6 mg Intravenous Q24H  . docusate  100 mg Per Tube BID  . DULoxetine  60 mg Oral Daily  . guaiFENesin  20 mL Per Tube Q4H  . levothyroxine  50 mcg Per Tube Q0600  . mouth rinse  15 mL Mouth Rinse 10 times per day  . pantoprazole sodium  40 mg Per Tube Daily  . polyethylene glycol  17 g Per Tube Daily  . pregabalin  100 mg Per Tube BID  . sodium chloride flush  3 mL Intravenous Q12H  . zinc sulfate  220 mg Per Tube Daily   PRN Medications: sodium chloride, acetaminophen (TYLENOL) oral liquid 160 mg/5 mL, midazolam, midazolam, ondansetron **OR** ondansetron (ZOFRAN) IV, sodium chloride flush, traMADol Hemodynamic parameters:   Intake/Output: 02/05 0701 - 02/06 0700 In: 713.1 [I.V.:513.1; IV Piggyback:200] Out: 700 [Urine:700]  Ventilator  Settings: Vent Mode: PRVC FiO2 (%):  [65 %-100 %] 80 % Set Rate:  [15 bmp] 15 bmp Vt Set:  [450 mL] 450 mL PEEP:  [5 cmH20] 5 cmH20 Plateau Pressure:  [12 cmH20-26 cmH20] 26 cmH20  LAB RESULTS:  Basic Metabolic Panel: Recent Labs  Lab 03/02/2020 1015 02/16/20 0443 02/17/20 1001  02/18/20 0526 02/19/20 0415  NA 140 142 142 143 142  K 3.3* 4.2 4.0 4.5 4.5  CL 100 106 103 103 103  CO2 _0 GLUCOSE 85 121* 76 101* 119*  BUN _1 30* 37*  CREATININE 0.79 0.68 0.81 0.84 0.98  CALCIUM 8.4* 7.9* 8.3* 7.8* 8.0*  MG  --  2.0 2.0 2.3 2.7*  PHOS  --  3.3 3.9 5.5* 4.9*   Liver Function Tests: Recent Labs  Lab 02/19/2020 1015 02/16/20 0443 02/17/20 1001 02/18/20 0526 02/19/20 0415  AST 41 39 45* 29 29  ALT _2 ALKPHOS 187* 154* 153* 152* 138*  BILITOT 0.4 0.3 0.5 0.3 0.5  PROT 6.9 6.1* 6.7 5.8* 5.5*  ALBUMIN 2.6* 2.1* 2.6* 2.0* 2.0*   No results for  input(s): LIPASE, AMYLASE in the last 168 hours. No results for input(s): AMMONIA in the last 168 hours. CBC: Recent Labs  Lab 02/21/2020 1015 02/16/20 0443 02/16/20 2201 02/17/20 1001 02/18/20 0526 02/19/20 0415  WBC 14.7* 10.8*  --  19.8* 17.5* 15.2*  NEUTROABS 13.5* 10.2*  --  18.9* 16.7* 14.7*  HGB 10.3* 8.9* 9.7* 9.6* 7.9* 8.0*  HCT 32.8* 27.7*  --  31.6* 26.2* 26.9*  MCV 76.5* 78.2*  --  81.7 78.4* 79.4*  PLT 395 406*  --  480* 411* 368   Cardiac Enzymes: No results for input(s): CKTOTAL, CKMB, CKMBINDEX, TROPONINI in the last 168 hours. BNP: Invalid input(s): POCBNP CBG: Recent Labs  Lab 02/17/20 0917 02/17/20 2009 02/18/20 0725 02/18/20 2021 02/19/20 0731  GLUCAP 72 131* 79 101* 93       IMAGING RESULTS:  Imaging: DG Chest 1 View  Result Date: 02/17/2020 CLINICAL DATA:  ETT EXAM: CHEST  1 VIEW COMPARISON:  February 17, 2020 FINDINGS: The cardiomediastinal silhouette is unchanged in contour.The tip terminates 3.7 cm above the carina. Enteric tube side port projects over the proximal stomach. RIGHT IJ CVC tip terminates over the RIGHT atrium. No pleural effusion. No pneumothorax. Diffuse bilateral heterogeneous opacities, unchanged. Visualized abdomen is unremarkable. No acute osseous abnormality IMPRESSION: 1.  Support apparatus as described above. 2. Unchanged  diffuse bilateral heterogeneous opacities consistent with the sequela of COVID-19 infection. Electronically Signed   By: Valentino Saxon MD   On: 02/17/2020 15:27   DG Abd 1 View  Result Date: 02/17/2020 CLINICAL DATA:  Enteric tube placement EXAM: ABDOMEN - 1 VIEW COMPARISON:  Same day radiograph. FINDINGS: Incomplete assessment of the pelvis. RIGHT IJ CVC tip terminates over the RIGHT atrium. Enteric tube tip and side port project over the stomach. Diffuse bilateral heterogeneous airspace opacities consistent with COVID 19 infection. No dilated loops of bowel are seen. IMPRESSION: 1. Enteric tube tip and side port project over the stomach. Electronically Signed   By: Valentino Saxon MD   On: 02/17/2020 15:28   _0 @ No results found.      ASSESSMENT AND PLAN    -Multidisciplinary rounds held today  Acute Hypoxic Respiratory Failure  Secondary to COVID19 -Remdesevir antiviral - pharmacy protocol 5 d -vitamin C -zinc -decadron 66m IV daily  -Diuresis - Lasix 40 IV daily - monitor UOP - utilize external urinary catheter if possible -Self prone if patient can tolerate  -encourage to use IS and Acapella device for bronchopulmonary hygiene when able -d/c hepatotoxic medications while on remdesevir -supportive care with ICU telemetry monitoring -PT/OT when possible -procalcitonin, CRP and ferritin trending -S/p ETT -continue Bronchodilator Therapy -Wean Fio2 and PEEP as tolerated -will perform SAT/SBT when respiratory parameters are met    NEUROLOGY - intubated and sedated -patent has hx of MS - minimal sedation to achieve a RASS goal: -1 Wake up assessment pending                 note-patient takes Elavil, Lyrica, Wellbutrin, Xanax, Cymbalta, Provigil at home - caution with centrally acting medications patient higher risk for extra pyramidal symptoms and adverse effects including EKG changes   Hypothyroidism   - continue home Levothyroxine - 1536m daily  ID -continue IV abx as prescibed -follow up cultures  GI/Nutrition GI PROPHYLAXIS as indicated DIET-->TF's as tolerated Constipation protocol as indicated  ENDO - ICU hypoglycemic\Hyperglycemia protocol -check FSBS per protocol   ELECTROLYTES -follow labs as needed -replace as needed -pharmacy consultation   DVT/GI  PRX ordered -SCDs  TRANSFUSIONS AS NEEDED MONITOR FSBS ASSESS the need for LABS as needed   Critical care provider statement:    Critical care time (minutes):  33   Critical care time was exclusive of:  Separately billable procedures and treating other patients   Critical care was necessary to treat or prevent imminent or life-threatening deterioration of the following conditions:  Acute hypoxemic respiratory faiulre due to COVID19, MS with possible acute exacerbation   Critical care was time spent personally by me on the following activities:  Development of treatment plan with patient or surrogate, discussions with consultants, evaluation of patient's response to treatment, examination of patient, obtaining history from patient or surrogate, ordering and performing treatments and interventions, ordering and review of laboratory studies and re-evaluation of patient's condition.  I assumed direction of critical care for this patient from another provider in my specialty: no    This document was prepared using Dragon voice recognition software and may include unintentional dictation errors.    Ottie Glazier, M.D.  Division of [place]

## 2020-02-20 DIAGNOSIS — J1282 Pneumonia due to coronavirus disease 2019: Secondary | ICD-10-CM

## 2020-02-20 DIAGNOSIS — U071 COVID-19: Principal | ICD-10-CM

## 2020-02-20 LAB — CBC WITH DIFFERENTIAL/PLATELET
Abs Immature Granulocytes: 0.05 10*3/uL (ref 0.00–0.07)
Basophils Absolute: 0 10*3/uL (ref 0.0–0.1)
Basophils Relative: 0 %
Eosinophils Absolute: 0 10*3/uL (ref 0.0–0.5)
Eosinophils Relative: 0 %
HCT: 26.3 % — ABNORMAL LOW (ref 36.0–46.0)
Hemoglobin: 7.9 g/dL — ABNORMAL LOW (ref 12.0–15.0)
Immature Granulocytes: 0 %
Lymphocytes Relative: 1 %
Lymphs Abs: 0.1 10*3/uL — ABNORMAL LOW (ref 0.7–4.0)
MCH: 23.7 pg — ABNORMAL LOW (ref 26.0–34.0)
MCHC: 30 g/dL (ref 30.0–36.0)
MCV: 78.7 fL — ABNORMAL LOW (ref 80.0–100.0)
Monocytes Absolute: 0.3 10*3/uL (ref 0.1–1.0)
Monocytes Relative: 2 %
Neutro Abs: 11.5 10*3/uL — ABNORMAL HIGH (ref 1.7–7.7)
Neutrophils Relative %: 97 %
Platelets: 389 10*3/uL (ref 150–400)
RBC: 3.34 MIL/uL — ABNORMAL LOW (ref 3.87–5.11)
RDW: 17.6 % — ABNORMAL HIGH (ref 11.5–15.5)
WBC: 11.9 10*3/uL — ABNORMAL HIGH (ref 4.0–10.5)
nRBC: 0 % (ref 0.0–0.2)

## 2020-02-20 LAB — COMPREHENSIVE METABOLIC PANEL
ALT: 10 U/L (ref 0–44)
AST: 20 U/L (ref 15–41)
Albumin: 1.9 g/dL — ABNORMAL LOW (ref 3.5–5.0)
Alkaline Phosphatase: 130 U/L — ABNORMAL HIGH (ref 38–126)
Anion gap: 6 (ref 5–15)
BUN: 32 mg/dL — ABNORMAL HIGH (ref 8–23)
CO2: 33 mmol/L — ABNORMAL HIGH (ref 22–32)
Calcium: 7.9 mg/dL — ABNORMAL LOW (ref 8.9–10.3)
Chloride: 104 mmol/L (ref 98–111)
Creatinine, Ser: 0.78 mg/dL (ref 0.44–1.00)
GFR, Estimated: >60 mL/min (ref >60)
Glucose, Bld: 137 mg/dL — ABNORMAL HIGH (ref 70–99)
Potassium: 4.8 mmol/L (ref 3.5–5.1)
Sodium: 143 mmol/L (ref 135–145)
Total Bilirubin: 0.5 mg/dL (ref 0.3–1.2)
Total Protein: 5.6 g/dL — ABNORMAL LOW (ref 6.5–8.1)

## 2020-02-20 LAB — GLUCOSE, CAPILLARY
Glucose-Capillary: 128 mg/dL — ABNORMAL HIGH (ref 70–99)
Glucose-Capillary: 121 mg/dL — ABNORMAL HIGH (ref 70–99)
Glucose-Capillary: 91 mg/dL (ref 70–99)
Glucose-Capillary: 105 mg/dL — ABNORMAL HIGH (ref 70–99)
Glucose-Capillary: 131 mg/dL — ABNORMAL HIGH (ref 70–99)
Glucose-Capillary: 157 mg/dL — ABNORMAL HIGH (ref 70–99)

## 2020-02-20 LAB — CULTURE, BLOOD (ROUTINE X 2)
Culture: NO GROWTH
Culture: NO GROWTH
Special Requests: ADEQUATE

## 2020-02-20 LAB — CULTURE, BLOOD (SINGLE): Culture: NO GROWTH

## 2020-02-20 LAB — PHOSPHORUS: Phosphorus: 3.6 mg/dL (ref 2.5–4.6)

## 2020-02-20 LAB — FIBRIN DERIVATIVES D-DIMER (ARMC ONLY): Fibrin derivatives D-dimer (ARMC): 6733.47 ng/mL (FEU) — ABNORMAL HIGH (ref 0.00–499.00)

## 2020-02-20 LAB — C-REACTIVE PROTEIN: CRP: 26.4 mg/dL — ABNORMAL HIGH (ref <1.0)

## 2020-02-20 LAB — MAGNESIUM: Magnesium: 2.5 mg/dL — ABNORMAL HIGH (ref 1.7–2.4)

## 2020-02-20 MED ORDER — VITAL AF 1.2 CAL PO LIQD
1000.0000 mL | ORAL | Status: DC
  Administered 2020-02-20 – 2020-02-26 (×6): 1000 mL

## 2020-02-20 MED ORDER — PROSOURCE TF PO LIQD
45.0000 mL | Freq: Every day | ORAL | Status: DC
  Administered 2020-02-21 – 2020-02-27 (×7): 45 mL
  Filled 2020-02-20 (×7): qty 45

## 2020-02-20 NOTE — Progress Notes (Addendum)
Initial Nutrition Assessment  DOCUMENTATION CODES:   Not applicable  INTERVENTION:   Change to Vital AF 1.2 @ 50ml/hr  Pro-Source TF 44ml daily via tube, provides 40kcal and 11g of protein per serving   Propofol: 9.7 ml/hr- provides 256kcal/day   Free water flushes 41ml q4 hours to maintain tube patency   Regimen provides 1624kcal/day, 110g/day protein and 1268ml/day (with propofol provides 1880kcal/day)   NUTRITION DIAGNOSIS:   Inadequate oral intake related to inability to eat (pt sedated and ventilated) as evidenced by NPO status.  GOAL:   Provide needs based on ASPEN/SCCM guidelines  MONITOR:   Vent status,Labs,Weight trends,Skin,I & O's,TF tolerance  REASON FOR ASSESSMENT:   Consult Enteral/tube feeding initiation and management  ASSESSMENT:   70 y/o female with h/o MS, HTN, HLD, hypothroidism, depression/anxiety and GERD who is admitted with COVID 19  Pt sedated and ventilated. OGT in place. Pt tolerating tube feeds well on the protocol. Per chart, pt appears weight stable pta.   Medications reviewed and include: vitamin C, dexamethasone, colace, synthroid, protonix, miralax, zinc, fentanyl, propofol   Labs reviewed: BUN 32(H), P 3.6 wnl, Mg 2.5(H) Wbc- 11.9(H), Hgb 7.9(L), Hct 26.3(L), MCV 78.7(L), MCH 23.7(L)  Patient is currently intubated on ventilator support MV: 6.7 L/min Temp (24hrs), Avg:98.5 F (36.9 C), Min:97.7 F (36.5 C), Max:100.3 F (37.9 C)  Propofol: 9.7 ml/hr- provides 256kcal/day   MAP- >59mmHg  UOP-   NUTRITION - FOCUSED PHYSICAL EXAM:  Flowsheet Row Most Recent Value  Orbital Region Mild depletion  Upper Arm Region No depletion  Thoracic and Lumbar Region No depletion  Buccal Region No depletion  Temple Region Mild depletion  Clavicle Bone Region Mild depletion  Clavicle and Acromion Bone Region Mild depletion  Scapular Bone Region No depletion  Dorsal Hand No depletion  Patellar Region No depletion  Anterior  Thigh Region No depletion  Posterior Calf Region No depletion  Edema (RD Assessment) Mild  Hair Reviewed  Eyes Reviewed  Mouth Reviewed  Skin Reviewed  Nails Reviewed     Diet Order:   Diet Order            Diet NPO time specified  Diet effective now                EDUCATION NEEDS:   No education needs have been identified at this time  Skin:  Skin Assessment: Reviewed RN Assessment (ecchymosis)  Last BM:  1/31- constipation  Height:   Ht Readings from Last 1 Encounters:  02/17/20 5\' 7"  (1.702 m)    Weight:   Wt Readings from Last 1 Encounters:  02/20/20 82.5 kg    Ideal Body Weight:  61.36 kg  BMI:  Body mass index is 28.49 kg/m.  Estimated Nutritional Needs:   Kcal:  1650kcal/day  Protein:  100-110g/day  Fluid:  1.5-1.8L/day  04/19/20 MS, RD, LDN Please refer to [facility] for RD and/or RD on-call/weekend/after hours pager

## 2020-02-20 NOTE — Progress Notes (Signed)
Follow up - Critical Care Medicine Note  Patient Details:    Summer Holloway is an 70 y.o. female with history of MS, presented to [facility] via EMS on 15 February 2020.  Had been ill for approximately 2 weeks prior to admission.  She had a positive COVID-19 home test on 04 February 2020.  Patient was noted to be hypoxic in the field and placed on nonrebreather mask and transported to the ED.  Still was showing positive Covid test on admission.  PCCM consulted for February 2022 due to worsening respiratory failure.  Patient required intubation and mechanical ventilation.  Transfer to [facility] service.    Lines, Airways, Drains: Airway 7.5 mm (Active)  Secured at (cm) 23 cm 02/20/20 2006  Measured From Lips 02/20/20 2006  [place] 02/20/20 2006  Secured By [employer] 02/20/20 2006  Tube Holder Repositioned Yes 02/20/20 2006  Prone position No 02/20/20 2006  Head position Right 02/20/20 2006  Cuff Pressure (cm H2O) 28 cm H2O 02/20/20 2006  Site Condition Dry 02/20/20 2006     CVC Triple Lumen 02/17/20 Internal jugular (Active)  Indication for Insertion or Continuance of Line Prolonged intravenous therapies 02/20/20 0800  Exposed Catheter (cm) 0 cm 02/17/20 1315  Site Assessment Clean;Dry;Intact 02/20/20 0800  Proximal Lumen Status Flushed;Infusing 02/20/20 0800  Medial Lumen Status Flushed;Infusing 02/20/20 0800  Distal Lumen Status Flushed;Blood return noted;Saline locked 02/20/20 0800  Dressing Type Transparent 02/20/20 0800  Dressing Status Clean;Dry;Intact 02/20/20 0800  Antimicrobial disc in place? Yes 02/20/20 0800  Line Care Connections checked and tightened 02/20/20 0800  Dressing Intervention Other (Comment) 02/19/20 2000  Dressing Change Due 02/24/20 02/20/20 0800     Closed System Drain Right Abdomen Bulb (JP) 49 Fr. (Active)     NG/OG Tube Right mouth Xray Documented cm marking at nare/ corner of mouth 55 cm (Active)  Cm Marking at Nare/Corner of Mouth  (if applicable) 55 cm 95/28/41 1600  Site Assessment Clean;Dry;Intact 02/20/20 1600  Ongoing Placement Verification No change in respiratory status;No acute changes, not attributed to clinical condition 02/20/20 1200  Status Infusing tube feed 02/20/20 1600  Drainage Appearance None 02/20/20 0800  Intake (mL) 90 mL 02/19/20 1750     Urethral Catheter M.Flowers RN Double-lumen 14 Fr. (Active)  Indication for Insertion or Continuance of Catheter Therapy based on hourly urine output monitoring and documentation for critical condition (NOT STRICT I&O) 02/20/20 0800  Site Assessment Clean;Intact 02/20/20 1600  Catheter Maintenance Insertion date on drainage bag;Bag below level of bladder;Catheter secured;No dependent loops;Drainage bag/tubing not touching floor;Seal intact 02/20/20 0800  Collection Container Standard drainage bag 02/20/20 0800  Securement Method Securing device (Describe) 02/20/20 0800  Urinary Catheter Interventions (if applicable) Unclamped 32/44/01 0800  Output (mL) 350 mL 02/20/20 2023    Anti-infectives:  Anti-infectives (From admission, onward)   Start     Dose/Rate Route Frequency Ordered Stop   02/17/20 1000  remdesivir 100 mg in sodium chloride 0.9 % 100 mL IVPB       "Followed by" Linked Group Details   100 mg 200 mL/hr over 30 Minutes Intravenous Daily 02/16/20 1147 02/20/20 0912   02/16/20 1245  remdesivir 200 mg in sodium chloride 0.9% 250 mL IVPB       "Followed by" Linked Group Details   200 mg 580 mL/hr over 30 Minutes Intravenous Once 02/16/20 1147 02/16/20 2219   02/16/20 1000  remdesivir 100 mg in sodium chloride 0.9 % 100 mL IVPB  Status:  Discontinued       "  Followed by" Linked Group Details   100 mg 200 mL/hr over 30 Minutes Intravenous Daily 02/26/2020 1135 03/11/2020 2102   02/16/20 1000  remdesivir 100 mg in sodium chloride 0.9 % 100 mL IVPB  Status:  Discontinued       "Followed by" Linked Group Details   100 mg 200 mL/hr over 30 Minutes  Intravenous Daily 03/04/2020 1343 03/01/2020 1352   02/27/2020 2200  levofloxacin (LEVAQUIN) IVPB 750 mg  Status:  Discontinued        750 mg 100 mL/hr over 90 Minutes Intravenous Every 24 hours 03/11/2020 2107 02/23/2020 2117   02/28/2020 2200  cefTRIAXone (ROCEPHIN) 1 g in sodium chloride 0.9 % 100 mL IVPB  Status:  Discontinued        1 g 200 mL/hr over 30 Minutes Intravenous Every 24 hours 03/10/2020 2117 02/16/20 0231   02/23/2020 2130  azithromycin (ZITHROMAX) 500 mg in sodium chloride 0.9 % 250 mL IVPB  Status:  Discontinued        500 mg 250 mL/hr over 60 Minutes Intravenous Every 24 hours 03/04/2020 2117 02/16/20 0231   03/04/2020 1415  remdesivir 200 mg in sodium chloride 0.9% 250 mL IVPB  Status:  Discontinued       "Followed by" Linked Group Details   200 mg 580 mL/hr over 30 Minutes Intravenous Once 02/18/2020 1343 02/16/2020 1352   02/19/2020 1300  remdesivir 200 mg in sodium chloride 0.9% 250 mL IVPB  Status:  Discontinued       "Followed by" Linked Group Details   200 mg 580 mL/hr over 30 Minutes Intravenous Once 02/27/2020 1135 03/05/2020 2102     Scheduled Meds: . albuterol  2 puff Inhalation Q6H  . amitriptyline  25 mg Per Tube QHS  . vitamin C  500 mg Per Tube Daily  . baricitinib  4 mg Per Tube Daily  . buPROPion  300 mg Oral Daily  . chlorhexidine gluconate (MEDLINE KIT)  15 mL Mouth Rinse BID  . Chlorhexidine Gluconate Cloth  6 each Topical Daily  . dexamethasone (DECADRON) injection  6 mg Intravenous Q24H  . docusate  100 mg Per Tube BID  . DULoxetine  60 mg Oral Daily  . [START ON 02/21/2020] feeding supplement (PROSource TF)  45 mL Per Tube Daily  . guaiFENesin  20 mL Per Tube Q4H  . levothyroxine  50 mcg Per Tube Q0600  . mouth rinse  15 mL Mouth Rinse 10 times per day  . pantoprazole sodium  40 mg Per Tube Daily  . polyethylene glycol  17 g Per Tube Daily  . pregabalin  100 mg Per Tube BID  . sodium chloride flush  3 mL Intravenous Q12H  . zinc sulfate  220 mg Per Tube Daily    Continuous Infusions: . sodium chloride Stopped (02/16/20 0318)  . feeding supplement (VITAL AF 1.2 CAL) 1,000 mL (02/20/20 1609)  . fentaNYL infusion INTRAVENOUS 225 mcg/hr (02/20/20 2025)  . propofol (DIPRIVAN) infusion 20 mcg/kg/min (02/20/20 2025)   PRN Meds:.sodium chloride, acetaminophen (TYLENOL) oral liquid 160 mg/5 mL, midazolam, midazolam, ondansetron **OR** ondansetron (ZOFRAN) IV, sodium chloride flush, traMADol   Microbiology: Results for orders placed or performed during the hospital encounter of 02/16/2020  SARS CORONAVIRUS 2 (TAT 6-24 HRS)     Status: Abnormal   Collection Time: 03/03/2020  9:57 AM  Result Value Ref Range Status   SARS Coronavirus 2 POSITIVE (A) NEGATIVE Final    Comment: (NOTE) SARS-CoV-2 target nucleic acids are DETECTED.  The  SARS-CoV-2 RNA is generally detectable in upper and lower respiratory specimens during the acute phase of infection. Positive results are indicative of the presence of SARS-CoV-2 RNA. Clinical correlation with patient history and other diagnostic information is  necessary to determine patient infection status. Positive results do not rule out bacterial infection or co-infection with other viruses.  The expected result is Negative.  Fact Sheet for Patients: SugarRoll.be  Fact Sheet for Healthcare Providers: https://www.woods-mathews.com/  This test is not yet approved or cleared by the [place] FDA and  has been authorized for detection and/or diagnosis of SARS-CoV-2 by FDA under an Emergency Use Authorization (EUA). This EUA will remain  in effect (meaning this test can be used) for the duration of the COVID-19 declaration under Section 564(b)(1) of the Act, 21 U. S.C. section 360bbb-3(b)(1), unless the authorization is terminated or revoked sooner.   Performed at [facility], [place] [address], [place], [place] [address]   Blood culture (single)     Status: None    Collection Time: 03/11/2020 10:15 AM   Specimen: BLOOD  Result Value Ref Range Status   Specimen Description BLOOD LEFT AC  Final   Special Requests   Final    BOTTLES DRAWN AEROBIC AND ANAEROBIC Blood Culture results may not be optimal due to an inadequate volume of blood received in culture bottles   Culture   Final    NO GROWTH 5 DAYS Performed at [facility], [address] [place], [place] [address]    Report Status 02/20/2020 FINAL  Final  Culture, blood (routine x 2) Call MD if unable to obtain prior to antibiotics being given     Status: None   Collection Time: 02/20/2020  9:38 PM   Specimen: BLOOD  Result Value Ref Range Status   Specimen Description BLOOD LEFT ANTECUBITAL  Final   Special Requests   Final    BOTTLES DRAWN AEROBIC AND ANAEROBIC Blood Culture results may not be optimal due to an inadequate volume of blood received in culture bottles   Culture   Final    NO GROWTH 5 DAYS Performed at [facility], [address] [place], [place] [address]    Report Status 02/20/2020 FINAL  Final  Culture, blood (routine x 2) Call MD if unable to obtain prior to antibiotics being given     Status: None   Collection Time: 02/25/2020  9:38 PM   Specimen: BLOOD  Result Value Ref Range Status   Specimen Description BLOOD BLOOD RIGHT FOREARM  Final   Special Requests   Final    BOTTLES DRAWN AEROBIC AND ANAEROBIC Blood Culture adequate volume   Culture   Final    NO GROWTH 5 DAYS Performed at [facility], [address], [place], [place] [address]    Report Status 02/20/2020 FINAL  Final  Culture, respiratory (non-expectorated)     Status: None (Preliminary result)   Collection Time: 02/18/20  1:49 PM   Specimen: Tracheal Aspirate; Respiratory  Result Value Ref Range Status   Specimen Description   Final    TRACHEAL ASPIRATE Performed at [facility], [address], [place], [place] [address]    Special Requests   Final     NONE Performed at [facility], [address] [place], [place] [address]    Gram Stain   Final    ABUNDANT WBC PRESENT, PREDOMINANTLY PMN RARE GRAM POSITIVE COCCI    Culture   Final  CULTURE REINCUBATED FOR BETTER GROWTH Performed at [facility], [address], [place], [place] [address]    Report Status PENDING  Incomplete    Best Practice/Protocols:  VTE Prophylaxis: Mechanical GI Prophylaxis: Proton Pump Inhibitor Sedation protocol VAP protocol  Events: 02/18/2020- patient remains critically ill on MV wit PRVC, weaned FiO2 from 100 to 80% today.  Sedation was light patient up sitting awake we advanced RASS goal to -3 to -4 for patient comfort.  02/19/2020- patient weaned to 70% today, no events today.  Met with husband and reviewed hospital course, answered questions.  He is happy and thankful for care. 02/20/2020- FiO2 down to 50%, PEEP at 8, still asynchronous when sedation is lightened   Studies: DG Chest 1 View  Result Date: 02/17/2020 CLINICAL DATA:  ETT EXAM: CHEST  1 VIEW COMPARISON:  February 17, 2020 FINDINGS: The cardiomediastinal silhouette is unchanged in contour.The tip terminates 3.7 cm above the carina. Enteric tube side port projects over the proximal stomach. RIGHT IJ CVC tip terminates over the RIGHT atrium. No pleural effusion. No pneumothorax. Diffuse bilateral heterogeneous opacities, unchanged. Visualized abdomen is unremarkable. No acute osseous abnormality IMPRESSION: 1.  Support apparatus as described above. 2. Unchanged diffuse bilateral heterogeneous opacities consistent with the sequela of COVID-19 infection. Electronically Signed   By: Meda Klinefelter MD   On: 02/17/2020 15:27   DG Abd 1 View  Result Date: 02/17/2020 CLINICAL DATA:  Enteric tube placement EXAM: ABDOMEN - 1 VIEW COMPARISON:  Same day radiograph. FINDINGS: Incomplete assessment of the pelvis. RIGHT IJ CVC tip terminates over the RIGHT atrium. Enteric tube tip and side  port project over the stomach. Diffuse bilateral heterogeneous airspace opacities consistent with COVID 19 infection. No dilated loops of bowel are seen. IMPRESSION: 1. Enteric tube tip and side port project over the stomach. Electronically Signed   By: Meda Klinefelter MD   On: 02/17/2020 15:28   CT Angio Chest PE W and/or Wo Contrast  Result Date: 02/23/2020 CLINICAL DATA:  Dyspnea, cough, COVID pneumonia EXAM: CT ANGIOGRAPHY CHEST WITH CONTRAST TECHNIQUE: Multidetector CT imaging of the chest was performed using the standard protocol during bolus administration of intravenous contrast. Multiplanar CT image reconstructions and MIPs were obtained to evaluate the vascular anatomy. CONTRAST:  80mL OMNIPAQUE IOHEXOL 350 MG/ML SOLN COMPARISON:  None. FINDINGS: Cardiovascular: There is adequate opacification of the pulmonary arterial tree. No intraluminal filling defect identified to suggest acute pulmonary embolism. Central pulmonary arteries are of normal caliber. Minimal coronary artery calcification. Global cardiac size within normal limits. No pericardial effusion. Minimal atherosclerotic plaque within the thoracic aorta. Mediastinum/Nodes: The thyroid is unremarkable. No pathologic thoracic adenopathy. Small hiatal hernia. Esophagus unremarkable. Lungs/Pleura: There is extensive diffuse multifocal ground-glass pulmonary infiltrate in keeping with changes of atypical infection and compatible with those seen with COVID-19 pneumonia. There is extensive parenchymal involvement. Pulmonary insufflation is normal and symmetric. No pneumothorax or pleural effusion. Central airways are widely patent. Upper Abdomen: No acute abnormality. Musculoskeletal: No acute bone abnormality. No lytic or blastic bone lesions. Review of the MIP images confirms the above findings. IMPRESSION: Extensive multifocal pulmonary infiltrates, likely infectious and compatible with those seen in COVID-19 pneumonia. Extensive parenchymal  involvement. No pulmonary embolism. Minimal coronary artery calcification. Aortic Atherosclerosis (ICD10-I70.0). Electronically Signed   By: Helyn Numbers MD   On: 02/18/2020 12:40   DG Chest Portable 1 View  Result Date: 02/14/2020 CLINICAL DATA:  Hypoxia, COVID, cough, dyspnea EXAM: PORTABLE CHEST 1 VIEW COMPARISON:  04/03/2015  chest radiograph. FINDINGS: Stable cardiomediastinal silhouette with normal heart size. No pneumothorax. No pleural effusion. Extensive patchy opacities throughout both lungs. IMPRESSION: Extensive patchy opacities throughout both lungs, compatible with COVID-19 pneumonia. Electronically Signed   By: Ilona Sorrel M.D.   On: 03/08/2020 10:33   DG Chest Port 1V same Day  Result Date: 02/17/2020 CLINICAL DATA:  COVID-19 pneumonia, acute hypoxemic respiratory failure. EXAM: PORTABLE CHEST 1 VIEW COMPARISON:  February 15, 2020. FINDINGS: The heart size and mediastinal contours are within normal limits. No pneumothorax or pleural effusion is noted. Bilateral patchy airspace opacities are noted consistent with multifocal pneumonia. The visualized skeletal structures are unremarkable. IMPRESSION: Bilateral patchy airspace opacities are noted consistent with multifocal pneumonia. Electronically Signed   By: Marijo Conception M.D.   On: 02/17/2020 08:55    Consults:    Subjective:    Overnight Issues: No overnight issues, able to tolerate weaning of oxygen down to 50%.  Objective:  Vital signs for last 24 hours: Temp:  [98.6 F (37 C)-100.3 F (37.9 C)] 100 F (37.8 C) (02/07 2000) Pulse Rate:  [68-102] 93 (02/07 2000) Resp:  [12-16] 15 (02/07 2000) BP: (109-137)/(58-71) 128/61 (02/07 2000) SpO2:  [87 %-100 %] 98 % (02/07 2006) FiO2 (%):  [45 %-70 %] 50 % (02/07 2006) Weight:  [82.5 kg] 82.5 kg (02/07 0500)  Hemodynamic parameters for last 24 hours:    Intake/Output from previous day: 02/06 0701 - 02/07 0700 In: 1581.1 [I.V.:889.5; NG/GT:591.7; IV Piggyback:100] Out:  1160 [Urine:1160]  Intake/Output this shift: Total I/O In: 561.4 [I.V.:561.4] Out: 350 [Urine:350]  Vent settings for last 24 hours: Vent Mode: PRVC FiO2 (%):  [45 %-70 %] 50 % Set Rate:  [15 bmp] 15 bmp Vt Set:  [450 mL] 450 mL PEEP:  [5 cmH20-10 cmH20] 8 cmH20 Plateau Pressure:  [18 cmH20-20 cmH20] 20 cmH20  Physical Exam:  Physical examination is limited due to need for PPE/CAPR GENERAL: Acute on chronically ill-appearing, intubated, sedated, mechanically ventilated. HEAD: Normocephalic, atraumatic.  EYES: Pupils equal, round, reactive to light.  No scleral icterus.  MOUTH: Orotracheally intubated, OG in place.  Mucosa intact. NECK: Supple. No thyromegaly. Trachea midline. No JVD.  No adenopathy. PULMONARY: Coarse breath sounds throughout. CARDIOVASCULAR: Monitor: NSR ABDOMEN: Soft, nondistended MUSCULOSKELETAL: No joint deformity, no clubbing, 1+ anasarca NEUROLOGIC: Sedated, a synchronous with the ventilator when sedation is lightened.  No overt focal deficit. SKIN: Intact,warm,dry.   Assessment/Plan:   Acute Hypoxic Respiratory Failure  Secondary to COVID19 -Intubated, mechanically ventilated -Wean PEEP and FiO2 as tolerated -FiO2 down to 50% today, still on PEEP of 8 -Complete remdesivir -vitamin C -zinc -decadron 4m IV daily  -Diuresis -as needed -supportive care with ICU telemetry monitoring -procalcitonin, CRP and ferritin trending -will perform SAT/SBT when respiratory parameters are met   Acute encephalopathy - intubated and sedated - patent has hx of MS - Underlying MS will make weaning more of a challenge - minimal sedation to achieve a RASS goal: -1/-2 - Wake up assessment daily                   *patient takes Elavil, Lyrica, Wellbutrin, Xanax, Cymbalta, Provigil at home - caution with centrally acting medications patient higher risk for extra pyramidal symptoms and adverse effects including EKG changes  Anemia No overt blood  loss Ferritin was low considering inflammatory response from COVID Microcytic Iron and iron stores Has occurred over the course of a year  Hypothyroidism continue home Levothyroxine - 1544mdaily Thyroid  studies  ID -continue IV abx as prescibed -follow up cultures  GI/Nutrition GI PROPHYLAXIS as indicated DIET-->TF's as tolerated Constipation protocol as indicated  ENDO - ICU hypoglycemic\Hyperglycemia protocol -check FSBS per protocol   ELECTROLYTES -follow labs as needed -replace as needed -pharmacy consultation   DVT/GI PRX ordered -SCDs due to anemia -Protonix     LOS: 5 days   Additional comments: Multidisciplinary rounds were performed with the ICU team  Critical Care Total Time*: 40 min   C. Derrill Kay, MD [place] [facility] 02/20/2020  *Care during the described time interval was provided by me and/or other providers on the critical care team.  I have reviewed this patient's available data, including medical history, events of note, physical examination and test results as part of my evaluation.

## 2020-02-21 ENCOUNTER — Inpatient Hospital Stay: Payer: Medicare PPO

## 2020-02-21 DIAGNOSIS — J1282 Pneumonia due to coronavirus disease 2019: Secondary | ICD-10-CM | POA: Diagnosis not present

## 2020-02-21 DIAGNOSIS — U071 COVID-19: Secondary | ICD-10-CM | POA: Diagnosis not present

## 2020-02-21 LAB — GLUCOSE, CAPILLARY
Glucose-Capillary: 129 mg/dL — ABNORMAL HIGH (ref 70–99)
Glucose-Capillary: 107 mg/dL — ABNORMAL HIGH (ref 70–99)
Glucose-Capillary: 104 mg/dL — ABNORMAL HIGH (ref 70–99)
Glucose-Capillary: 97 mg/dL (ref 70–99)
Glucose-Capillary: 122 mg/dL — ABNORMAL HIGH (ref 70–99)
Glucose-Capillary: 138 mg/dL — ABNORMAL HIGH (ref 70–99)

## 2020-02-21 LAB — CBC WITH DIFFERENTIAL/PLATELET
Abs Immature Granulocytes: 0.05 10*3/uL (ref 0.00–0.07)
Basophils Absolute: 0 10*3/uL (ref 0.0–0.1)
Basophils Relative: 0 %
Eosinophils Absolute: 0 10*3/uL (ref 0.0–0.5)
Eosinophils Relative: 0 %
HCT: 26 % — ABNORMAL LOW (ref 36.0–46.0)
Hemoglobin: 7.8 g/dL — ABNORMAL LOW (ref 12.0–15.0)
Immature Granulocytes: 0 %
Lymphocytes Relative: 1 %
Lymphs Abs: 0.1 10*3/uL — ABNORMAL LOW (ref 0.7–4.0)
MCH: 23.8 pg — ABNORMAL LOW (ref 26.0–34.0)
MCHC: 30 g/dL (ref 30.0–36.0)
MCV: 79.3 fL — ABNORMAL LOW (ref 80.0–100.0)
Monocytes Absolute: 0.5 10*3/uL (ref 0.1–1.0)
Monocytes Relative: 4 %
Neutro Abs: 10.8 10*3/uL — ABNORMAL HIGH (ref 1.7–7.7)
Neutrophils Relative %: 95 %
Platelets: 299 10*3/uL (ref 150–400)
RBC: 3.28 MIL/uL — ABNORMAL LOW (ref 3.87–5.11)
RDW: 17.8 % — ABNORMAL HIGH (ref 11.5–15.5)
WBC: 11.5 10*3/uL — ABNORMAL HIGH (ref 4.0–10.5)
nRBC: 0 % (ref 0.0–0.2)

## 2020-02-21 LAB — COMPREHENSIVE METABOLIC PANEL
ALT: 9 U/L (ref 0–44)
AST: 18 U/L (ref 15–41)
Albumin: 1.7 g/dL — ABNORMAL LOW (ref 3.5–5.0)
Alkaline Phosphatase: 119 U/L (ref 38–126)
Anion gap: 9 (ref 5–15)
BUN: 29 mg/dL — ABNORMAL HIGH (ref 8–23)
CO2: 35 mmol/L — ABNORMAL HIGH (ref 22–32)
Calcium: 8 mg/dL — ABNORMAL LOW (ref 8.9–10.3)
Chloride: 102 mmol/L (ref 98–111)
Creatinine, Ser: 0.63 mg/dL (ref 0.44–1.00)
GFR, Estimated: >60 mL/min (ref >60)
Glucose, Bld: 135 mg/dL — ABNORMAL HIGH (ref 70–99)
Potassium: 4.9 mmol/L (ref 3.5–5.1)
Sodium: 146 mmol/L — ABNORMAL HIGH (ref 135–145)
Total Bilirubin: 0.5 mg/dL (ref 0.3–1.2)
Total Protein: 5 g/dL — ABNORMAL LOW (ref 6.5–8.1)

## 2020-02-21 LAB — C-REACTIVE PROTEIN: CRP: 18.4 mg/dL — ABNORMAL HIGH (ref <1.0)

## 2020-02-21 LAB — FERRITIN: Ferritin: 99 ng/mL (ref 11–307)

## 2020-02-21 LAB — FIBRIN DERIVATIVES D-DIMER (ARMC ONLY): Fibrin derivatives D-dimer (ARMC): >7500 ng/mL (FEU) — ABNORMAL HIGH (ref 0.00–499.00)

## 2020-02-21 NOTE — Progress Notes (Signed)
Follow up - Critical Care Medicine Note  Patient Details:    Summer Holloway is an 70 y.o. female with history of MS, presented to [facility] via EMS on 15 February 2020.  Had been ill for approximately 2 weeks prior to admission.  She had a positive COVID-19 home test on 04 February 2020.  Patient was noted to be hypoxic in the field and placed on nonrebreather mask and transported to the ED.  Still was showing positive Covid test on admission.  PCCM consulted for February 2022 due to worsening respiratory failure.  Patient required intubation and mechanical ventilation.  Transfer to [facility] service.    Lines, Airways, Drains: Airway 7.5 mm (Active)  Secured at (cm) 23 cm 02/20/20 2006  Measured From Lips 02/20/20 2006  [place] 02/20/20 2006  Secured By [employer] 02/20/20 2006  Tube Holder Repositioned Yes 02/20/20 2006  Prone position No 02/20/20 2006  Head position Right 02/20/20 2006  Cuff Pressure (cm H2O) 28 cm H2O 02/20/20 2006  Site Condition Dry 02/20/20 2006     CVC Triple Lumen 02/17/20 Internal jugular (Active)  Indication for Insertion or Continuance of Line Prolonged intravenous therapies 02/20/20 0800  Exposed Catheter (cm) 0 cm 02/17/20 1315  Site Assessment Clean;Dry;Intact 02/20/20 0800  Proximal Lumen Status Flushed;Infusing 02/20/20 0800  Medial Lumen Status Flushed;Infusing 02/20/20 0800  Distal Lumen Status Flushed;Blood return noted;Saline locked 02/20/20 0800  Dressing Type Transparent 02/20/20 0800  Dressing Status Clean;Dry;Intact 02/20/20 0800  Antimicrobial disc in place? Yes 02/20/20 0800  Line Care Connections checked and tightened 02/20/20 0800  Dressing Intervention Other (Comment) 02/19/20 2000  Dressing Change Due 02/24/20 02/20/20 0800     Closed System Drain Right Abdomen Bulb (JP) 44 Fr. (Active)     NG/OG Tube Right mouth Xray Documented cm marking at nare/ corner of mouth 55 cm (Active)  Cm Marking at Nare/Corner of Mouth  (if applicable) 55 cm 16/10/96 1600  Site Assessment Clean;Dry;Intact 02/20/20 1600  Ongoing Placement Verification No change in respiratory status;No acute changes, not attributed to clinical condition 02/20/20 1200  Status Infusing tube feed 02/20/20 1600  Drainage Appearance None 02/20/20 0800  Intake (mL) 90 mL 02/19/20 1750     Urethral Catheter M.Flowers RN Double-lumen 14 Fr. (Active)  Indication for Insertion or Continuance of Catheter Therapy based on hourly urine output monitoring and documentation for critical condition (NOT STRICT I&O) 02/20/20 0800  Site Assessment Clean;Intact 02/20/20 1600  Catheter Maintenance Insertion date on drainage bag;Bag below level of bladder;Catheter secured;No dependent loops;Drainage bag/tubing not touching floor;Seal intact 02/20/20 0800  Collection Container Standard drainage bag 02/20/20 0800  Securement Method Securing device (Describe) 02/20/20 0800  Urinary Catheter Interventions (if applicable) Unclamped 04/54/09 0800  Output (mL) 350 mL 02/20/20 2023    Anti-infectives:  Anti-infectives (From admission, onward)   Start     Dose/Rate Route Frequency Ordered Stop   02/17/20 1000  remdesivir 100 mg in sodium chloride 0.9 % 100 mL IVPB       "Followed by" Linked Group Details   100 mg 200 mL/hr over 30 Minutes Intravenous Daily 02/16/20 1147 02/20/20 0912   02/16/20 1245  remdesivir 200 mg in sodium chloride 0.9% 250 mL IVPB       "Followed by" Linked Group Details   200 mg 580 mL/hr over 30 Minutes Intravenous Once 02/16/20 1147 02/16/20 2219   02/16/20 1000  remdesivir 100 mg in sodium chloride 0.9 % 100 mL IVPB  Status:  Discontinued       "  Followed by" Linked Group Details   100 mg 200 mL/hr over 30 Minutes Intravenous Daily 03/12/2020 1135 02/14/2020 2102   02/16/20 1000  remdesivir 100 mg in sodium chloride 0.9 % 100 mL IVPB  Status:  Discontinued       "Followed by" Linked Group Details   100 mg 200 mL/hr over 30 Minutes  Intravenous Daily 02/21/2020 1343 02/14/2020 1352   02/14/2020 2200  levofloxacin (LEVAQUIN) IVPB 750 mg  Status:  Discontinued        750 mg 100 mL/hr over 90 Minutes Intravenous Every 24 hours 03/03/2020 2107 02/26/2020 2117   02/16/2020 2200  cefTRIAXone (ROCEPHIN) 1 g in sodium chloride 0.9 % 100 mL IVPB  Status:  Discontinued        1 g 200 mL/hr over 30 Minutes Intravenous Every 24 hours 03/03/2020 2117 02/16/20 0231   02/25/2020 2130  azithromycin (ZITHROMAX) 500 mg in sodium chloride 0.9 % 250 mL IVPB  Status:  Discontinued        500 mg 250 mL/hr over 60 Minutes Intravenous Every 24 hours 02/22/2020 2117 02/16/20 0231   03/12/2020 1415  remdesivir 200 mg in sodium chloride 0.9% 250 mL IVPB  Status:  Discontinued       "Followed by" Linked Group Details   200 mg 580 mL/hr over 30 Minutes Intravenous Once 03/09/2020 1343 03/08/2020 1352   02/23/2020 1300  remdesivir 200 mg in sodium chloride 0.9% 250 mL IVPB  Status:  Discontinued       "Followed by" Linked Group Details   200 mg 580 mL/hr over 30 Minutes Intravenous Once 02/23/2020 1135 02/16/2020 2102     Scheduled Meds: . albuterol  2 puff Inhalation Q6H  . amitriptyline  25 mg Per Tube QHS  . vitamin C  500 mg Per Tube Daily  . baricitinib  4 mg Per Tube Daily  . chlorhexidine gluconate (MEDLINE KIT)  15 mL Mouth Rinse BID  . Chlorhexidine Gluconate Cloth  6 each Topical Daily  . dexamethasone (DECADRON) injection  6 mg Intravenous Q24H  . docusate  100 mg Per Tube BID  . feeding supplement (PROSource TF)  45 mL Per Tube Daily  . guaiFENesin  20 mL Per Tube Q4H  . levothyroxine  50 mcg Per Tube Q0600  . mouth rinse  15 mL Mouth Rinse 10 times per day  . pantoprazole sodium  40 mg Per Tube Daily  . polyethylene glycol  17 g Per Tube Daily  . pregabalin  100 mg Per Tube BID  . sodium chloride flush  3 mL Intravenous Q12H  . zinc sulfate  220 mg Per Tube Daily   Continuous Infusions: . sodium chloride Stopped (02/16/20 0318)  . feeding supplement  (VITAL AF 1.2 CAL) 1,000 mL (02/21/20 1130)  . fentaNYL infusion INTRAVENOUS 225 mcg/hr (02/21/20 1728)  . propofol (DIPRIVAN) infusion 15 mcg/kg/min (02/21/20 1728)   PRN Meds:.sodium chloride, acetaminophen (TYLENOL) oral liquid 160 mg/5 mL, midazolam, midazolam, ondansetron **OR** ondansetron (ZOFRAN) IV, sodium chloride flush, traMADol   Microbiology: Results for orders placed or performed during the hospital encounter of 03/11/2020  SARS CORONAVIRUS 2 (TAT 6-24 HRS)     Status: Abnormal   Collection Time: 02/26/2020  9:57 AM  Result Value Ref Range Status   SARS Coronavirus 2 POSITIVE (A) NEGATIVE Final    Comment: (NOTE) SARS-CoV-2 target nucleic acids are DETECTED.  The SARS-CoV-2 RNA is generally detectable in upper and lower respiratory specimens during the acute phase of infection. Positive results  are indicative of the presence of SARS-CoV-2 RNA. Clinical correlation with patient history and other diagnostic information is  necessary to determine patient infection status. Positive results do not rule out bacterial infection or co-infection with other viruses.  The expected result is Negative.  Fact Sheet for Patients: SugarRoll.be  Fact Sheet for Healthcare Providers: https://www.woods-mathews.com/  This test is not yet approved or cleared by the [place] FDA and  has been authorized for detection and/or diagnosis of SARS-CoV-2 by FDA under an Emergency Use Authorization (EUA). This EUA will remain  in effect (meaning this test can be used) for the duration of the COVID-19 declaration under Section 564(b)(1) of the Act, 21 U. S.C. section 360bbb-3(b)(1), unless the authorization is terminated or revoked sooner.   Performed at [facility], [place] [address], [place], [place] [address]   Blood culture (single)     Status: None   Collection Time: 02/21/2020 10:15 AM   Specimen: BLOOD  Result Value Ref Range Status    Specimen Description BLOOD LEFT AC  Final   Special Requests   Final    BOTTLES DRAWN AEROBIC AND ANAEROBIC Blood Culture results may not be optimal due to an inadequate volume of blood received in culture bottles   Culture   Final    NO GROWTH 5 DAYS Performed at [facility], [address], [place], [place] [address]    Report Status 02/20/2020 FINAL  Final  Culture, blood (routine x 2) Call MD if unable to obtain prior to antibiotics being given     Status: None   Collection Time: 02/23/2020  9:38 PM   Specimen: BLOOD  Result Value Ref Range Status   Specimen Description BLOOD LEFT ANTECUBITAL  Final   Special Requests   Final    BOTTLES DRAWN AEROBIC AND ANAEROBIC Blood Culture results may not be optimal due to an inadequate volume of blood received in culture bottles   Culture   Final    NO GROWTH 5 DAYS Performed at [facility], [address] [place], [place] [address]    Report Status 02/20/2020 FINAL  Final  Culture, blood (routine x 2) Call MD if unable to obtain prior to antibiotics being given     Status: None   Collection Time: 02/19/2020  9:38 PM   Specimen: BLOOD  Result Value Ref Range Status   Specimen Description BLOOD BLOOD RIGHT FOREARM  Final   Special Requests   Final    BOTTLES DRAWN AEROBIC AND ANAEROBIC Blood Culture adequate volume   Culture   Final    NO GROWTH 5 DAYS Performed at [facility], [address], [place], [place] [address]    Report Status 02/20/2020 FINAL  Final  Culture, respiratory (non-expectorated)     Status: None (Preliminary result)   Collection Time: 02/18/20  1:49 PM   Specimen: Tracheal Aspirate; Respiratory  Result Value Ref Range Status   Specimen Description   Final    TRACHEAL ASPIRATE Performed at [facility], [address], [place], [place] [address]    Special Requests   Final    NONE Performed at [facility], [place], [place] [address]    Gram  Stain   Final    ABUNDANT WBC PRESENT, PREDOMINANTLY PMN RARE GRAM POSITIVE COCCI Performed at [facility], [place] [address] [place], [place] [address]    Culture FEW CANDIDA ALBICANS RARE STAPHYLOCOCCUS AUREUS   Final   Report  Status PENDING  Incomplete    Best Practice/Protocols:  VTE Prophylaxis: Mechanical GI Prophylaxis: Proton Pump Inhibitor Sedation protocol VAP protocol  Events: 02/18/2020- patient remains critically ill on MV wit PRVC, weaned FiO2 from 100 to 80% today.  Sedation was light patient up sitting awake we advanced RASS goal to -3 to -4 for patient comfort.  02/19/2020- patient weaned to 70% today, no events today.  Met with husband and reviewed hospital course, answered questions.  He is happy and thankful for care. 02/20/2020- FiO2 down to 50%, PEEP at 8, still asynchronous when sedation is lightened 02/21/2020- very weak respiratory effort (patient with great deal of deconditioning PTA) unable to wean   Studies: DG Chest 1 View  Result Date: 02/17/2020 CLINICAL DATA:  ETT EXAM: CHEST  1 VIEW COMPARISON:  February 17, 2020 FINDINGS: The cardiomediastinal silhouette is unchanged in contour.The tip terminates 3.7 cm above the carina. Enteric tube side port projects over the proximal stomach. RIGHT IJ CVC tip terminates over the RIGHT atrium. No pleural effusion. No pneumothorax. Diffuse bilateral heterogeneous opacities, unchanged. Visualized abdomen is unremarkable. No acute osseous abnormality IMPRESSION: 1.  Support apparatus as described above. 2. Unchanged diffuse bilateral heterogeneous opacities consistent with the sequela of COVID-19 infection. Electronically Signed   By: Valentino Saxon MD   On: 02/17/2020 15:27   DG Abd 1 View  Result Date: 02/17/2020 CLINICAL DATA:  Enteric tube placement EXAM: ABDOMEN - 1 VIEW COMPARISON:  Same day radiograph. FINDINGS: Incomplete assessment of the pelvis. RIGHT IJ CVC tip terminates over the RIGHT atrium. Enteric  tube tip and side port project over the stomach. Diffuse bilateral heterogeneous airspace opacities consistent with COVID 19 infection. No dilated loops of bowel are seen. IMPRESSION: 1. Enteric tube tip and side port project over the stomach. Electronically Signed   By: Valentino Saxon MD   On: 02/17/2020 15:28   CT Angio Chest PE W and/or Wo Contrast  Result Date: 03/12/2020 CLINICAL DATA:  Dyspnea, cough, COVID pneumonia EXAM: CT ANGIOGRAPHY CHEST WITH CONTRAST TECHNIQUE: Multidetector CT imaging of the chest was performed using the standard protocol during bolus administration of intravenous contrast. Multiplanar CT image reconstructions and MIPs were obtained to evaluate the vascular anatomy. CONTRAST:  60m OMNIPAQUE IOHEXOL 350 MG/ML SOLN COMPARISON:  None. FINDINGS: Cardiovascular: There is adequate opacification of the pulmonary arterial tree. No intraluminal filling defect identified to suggest acute pulmonary embolism. Central pulmonary arteries are of normal caliber. Minimal coronary artery calcification. Global cardiac size within normal limits. No pericardial effusion. Minimal atherosclerotic plaque within the thoracic aorta. Mediastinum/Nodes: The thyroid is unremarkable. No pathologic thoracic adenopathy. Small hiatal hernia. Esophagus unremarkable. Lungs/Pleura: There is extensive diffuse multifocal ground-glass pulmonary infiltrate in keeping with changes of atypical infection and compatible with those seen with COVID-19 pneumonia. There is extensive parenchymal involvement. Pulmonary insufflation is normal and symmetric. No pneumothorax or pleural effusion. Central airways are widely patent. Upper Abdomen: No acute abnormality. Musculoskeletal: No acute bone abnormality. No lytic or blastic bone lesions. Review of the MIP images confirms the above findings. IMPRESSION: Extensive multifocal pulmonary infiltrates, likely infectious and compatible with those seen in COVID-19 pneumonia.  Extensive parenchymal involvement. No pulmonary embolism. Minimal coronary artery calcification. Aortic Atherosclerosis (ICD10-I70.0). Electronically Signed   By: AFidela SalisburyMD   On: 03/12/2020 12:40   DG Chest Port 1 View  Result Date: 02/21/2020 CLINICAL DATA:  Acute respiratory failure with hypoxia. EXAM: PORTABLE CHEST 1 VIEW COMPARISON:  02/17/2020 FINDINGS: The endotracheal tube, central venous  catheter, and enteric tube all appear to be grossly stable in positioning. There are diffuse bilateral hazy airspace opacities, not substantially changed from prior study. No pneumothorax. No large pleural effusion. IMPRESSION: 1. Essentially stable lines and tubes. 2. No significant interval change. Electronically Signed   By: Constance Holster M.D.   On: 02/21/2020 01:41   DG Chest Portable 1 View  Result Date: 02/22/2020 CLINICAL DATA:  Hypoxia, COVID, cough, dyspnea EXAM: PORTABLE CHEST 1 VIEW COMPARISON:  04/03/2015 chest radiograph. FINDINGS: Stable cardiomediastinal silhouette with normal heart size. No pneumothorax. No pleural effusion. Extensive patchy opacities throughout both lungs. IMPRESSION: Extensive patchy opacities throughout both lungs, compatible with COVID-19 pneumonia. Electronically Signed   By: Ilona Sorrel M.D.   On: 02/25/2020 10:33   DG Chest Port 1V same Day  Result Date: 02/17/2020 CLINICAL DATA:  COVID-19 pneumonia, acute hypoxemic respiratory failure. EXAM: PORTABLE CHEST 1 VIEW COMPARISON:  February 15, 2020. FINDINGS: The heart size and mediastinal contours are within normal limits. No pneumothorax or pleural effusion is noted. Bilateral patchy airspace opacities are noted consistent with multifocal pneumonia. The visualized skeletal structures are unremarkable. IMPRESSION: Bilateral patchy airspace opacities are noted consistent with multifocal pneumonia. Electronically Signed   By: Marijo Conception M.D.   On: 02/17/2020 08:55    Consults:    Subjective:    Overnight  Issues: No overnight issues, able to tolerate weaning of oxygen down to 50%.  Not able to perform SBT.  Objective:  Vital signs for last 24 hours: Temp:  [99.1 F (37.3 C)-99.9 F (37.7 C)] 99.1 F (37.3 C) (02/08 1600) Pulse Rate:  [84-93] 93 (02/08 1800) Resp:  [13-15] 14 (02/08 1800) BP: (119-138)/(54-68) 138/68 (02/08 1800) SpO2:  [91 %-100 %] 93 % (02/08 2003) FiO2 (%):  [50 %] 50 % (02/08 2003)  Hemodynamic parameters for last 24 hours:    Intake/Output from previous day: 02/07 0701 - 02/08 0700 In: 567.4 [I.V.:567.4] Out: 1750 [Urine:1750]  Intake/Output this shift: No intake/output data recorded.  Vent settings for last 24 hours: Vent Mode: PRVC FiO2 (%):  [50 %] 50 % Set Rate:  [15 bmp] 15 bmp Vt Set:  [450 mL] 450 mL PEEP:  [5 cmH20-8 cmH20] 5 cmH20  Physical Exam:  Physical examination is limited due to need for PPE/CAPR GENERAL: Acute on chronically ill-appearing, intubated, sedated, mechanically ventilated. HEAD: Normocephalic, atraumatic.  EYES: Pupils equal, round, reactive to light.  No scleral icterus.  MOUTH: Orotracheally intubated, OG in place.  Mucosa intact. NECK: Supple. No thyromegaly. Trachea midline. No JVD.  No adenopathy. PULMONARY: Coarse breath sounds throughout. CARDIOVASCULAR: Monitor: NSR ABDOMEN: Soft, nondistended MUSCULOSKELETAL: No joint deformity, no clubbing, 1+ anasarca NEUROLOGIC: Sedated, a synchronous with the ventilator when sedation is lightened.  No overt focal deficit. SKIN: Intact,warm,dry.   Assessment/Plan:   Acute Hypoxic Respiratory Failure  Secondary to COVID19 -Intubated, mechanically ventilated -Wean PEEP and FiO2 as tolerated -FiO2  50% today, still on PEEP of 5 -Completed remdesivir -vitamin C -zinc -decadron $RemoveBeforeD'6mg'[id number]$  IV daily  -Diuresis -as needed -supportive care with ICU monitoring -procalcitonin, CRP and ferritin trending -will perform SAT/SBT when respiratory parameters are met   Acute  encephalopathy - intubated and sedated - patent has hx of MS, severe underlying deconditioning/debility - Underlying MS will make weaning more of a challenge - minimal sedation to achieve a RASS goal: -1/-2 - Wake up assessment daily                   *  patient takes Elavil, Lyrica, Wellbutrin, Xanax, Cymbalta, Provigil at home - caution with centrally acting medications patient higher risk for extra pyramidal symptoms and adverse effects including EKG changes  Anemia No overt blood loss Ferritin was low considering inflammatory response from COVID Microcytic Iron and iron stores Has occurred over the course of a year  Hypothyroidism continue home Levothyroxine - 169m daily Thyroid studies  ID -continue IV abx as prescibed -follow up cultures  GI/Nutrition GI PROPHYLAXIS as indicated DIET-->TF's as tolerated Constipation protocol as indicated  ENDO - ICU hypoglycemic\Hyperglycemia protocol -check FSBS per protocol   ELECTROLYTES -follow labs as needed -replace as needed -pharmacy consultation   DVT/GI PRX ordered -SCDs due to anemia -Protonix     LOS: 6 days   Additional comments: Multidisciplinary rounds were performed with the ICU team.  Care coordination performed with bedside nurse.  Updated patient's husband in person.  Critical Care Total Time*: 40 min   C. LDerrill Kay MD [employer] PCCM 02/21/2020  *Care during the described time interval was provided by me and/or other providers on the critical care team.  I have reviewed this patient's available data, including medical history, events of note, physical examination and test results as part of my evaluation.

## 2020-02-22 ENCOUNTER — Inpatient Hospital Stay (HOSPITAL_COMMUNITY)
Admit: 2020-02-22 | Discharge: 2020-02-22 | Disposition: A | Discharge status: Home / Self Care | Payer: Medicare PPO | Attending: Pulmonary Disease | Admitting: Pulmonary Disease

## 2020-02-22 DIAGNOSIS — J1282 Pneumonia due to coronavirus disease 2019: Secondary | ICD-10-CM | POA: Diagnosis not present

## 2020-02-22 DIAGNOSIS — U071 COVID-19: Secondary | ICD-10-CM | POA: Diagnosis not present

## 2020-02-22 DIAGNOSIS — J9600 Acute respiratory failure, unspecified whether with hypoxia or hypercapnia: Secondary | ICD-10-CM

## 2020-02-22 DIAGNOSIS — I428 Other cardiomyopathies: Secondary | ICD-10-CM | POA: Diagnosis not present

## 2020-02-22 LAB — CBC WITH DIFFERENTIAL/PLATELET
Abs Immature Granulocytes: 0.08 10*3/uL — ABNORMAL HIGH (ref 0.00–0.07)
Basophils Absolute: 0 10*3/uL (ref 0.0–0.1)
Basophils Relative: 0 %
Eosinophils Absolute: 0 10*3/uL (ref 0.0–0.5)
Eosinophils Relative: 0 %
HCT: 25.3 % — ABNORMAL LOW (ref 36.0–46.0)
Hemoglobin: 7.4 g/dL — ABNORMAL LOW (ref 12.0–15.0)
Immature Granulocytes: 1 %
Lymphocytes Relative: 1 %
Lymphs Abs: 0.2 10*3/uL — ABNORMAL LOW (ref 0.7–4.0)
MCH: 23.8 pg — ABNORMAL LOW (ref 26.0–34.0)
MCHC: 29.2 g/dL — ABNORMAL LOW (ref 30.0–36.0)
MCV: 81.4 fL (ref 80.0–100.0)
Monocytes Absolute: 0.6 10*3/uL (ref 0.1–1.0)
Monocytes Relative: 5 %
Neutro Abs: 10.4 10*3/uL — ABNORMAL HIGH (ref 1.7–7.7)
Neutrophils Relative %: 93 %
Platelets: 302 10*3/uL (ref 150–400)
RBC: 3.11 MIL/uL — ABNORMAL LOW (ref 3.87–5.11)
RDW: 17.8 % — ABNORMAL HIGH (ref 11.5–15.5)
WBC: 11.2 10*3/uL — ABNORMAL HIGH (ref 4.0–10.5)
nRBC: 0 % (ref 0.0–0.2)

## 2020-02-22 LAB — ECHOCARDIOGRAM COMPLETE
AR max vel: 2.3 cm2
AV Area VTI: 2.65 cm2
AV Area mean vel: 2.53 cm2
AV Mean grad: 6 mmHg
AV Peak grad: 13.2 mmHg
Ao pk vel: 1.82 m/s
Area-P 1/2: 9.14 cm2
Height: 67 in
MV VTI: 3.15 cm2
S' Lateral: 2.1 cm
Weight: 2910.07 oz

## 2020-02-22 LAB — COMPREHENSIVE METABOLIC PANEL
ALT: 8 U/L (ref 0–44)
AST: 17 U/L (ref 15–41)
Albumin: 1.7 g/dL — ABNORMAL LOW (ref 3.5–5.0)
Alkaline Phosphatase: 101 U/L (ref 38–126)
Anion gap: 11 (ref 5–15)
BUN: 26 mg/dL — ABNORMAL HIGH (ref 8–23)
CO2: 36 mmol/L — ABNORMAL HIGH (ref 22–32)
Calcium: 8 mg/dL — ABNORMAL LOW (ref 8.9–10.3)
Chloride: 100 mmol/L (ref 98–111)
Creatinine, Ser: 0.57 mg/dL (ref 0.44–1.00)
GFR, Estimated: >60 mL/min (ref >60)
Glucose, Bld: 128 mg/dL — ABNORMAL HIGH (ref 70–99)
Potassium: 4.9 mmol/L (ref 3.5–5.1)
Sodium: 147 mmol/L — ABNORMAL HIGH (ref 135–145)
Total Bilirubin: 0.2 mg/dL — ABNORMAL LOW (ref 0.3–1.2)
Total Protein: 5.1 g/dL — ABNORMAL LOW (ref 6.5–8.1)

## 2020-02-22 LAB — TRIGLYCERIDES: Triglycerides: 78 mg/dL (ref <150)

## 2020-02-22 LAB — IRON AND TIBC
Iron: 12 ug/dL — ABNORMAL LOW (ref 28–170)
Saturation Ratios: 5 % — ABNORMAL LOW (ref 10.4–31.8)
TIBC: 230 ug/dL — ABNORMAL LOW (ref 250–450)
UIBC: 218 ug/dL

## 2020-02-22 LAB — CULTURE, RESPIRATORY W GRAM STAIN

## 2020-02-22 LAB — GLUCOSE, CAPILLARY
Glucose-Capillary: 103 mg/dL — ABNORMAL HIGH (ref 70–99)
Glucose-Capillary: 103 mg/dL — ABNORMAL HIGH (ref 70–99)
Glucose-Capillary: 105 mg/dL — ABNORMAL HIGH (ref 70–99)
Glucose-Capillary: 106 mg/dL — ABNORMAL HIGH (ref 70–99)
Glucose-Capillary: 118 mg/dL — ABNORMAL HIGH (ref 70–99)
Glucose-Capillary: 130 mg/dL — ABNORMAL HIGH (ref 70–99)

## 2020-02-22 LAB — C-REACTIVE PROTEIN: CRP: 13.5 mg/dL — ABNORMAL HIGH (ref <1.0)

## 2020-02-22 LAB — FIBRIN DERIVATIVES D-DIMER (ARMC ONLY): Fibrin derivatives D-dimer (ARMC): >7500 ng/mL (FEU) — ABNORMAL HIGH (ref 0.00–499.00)

## 2020-02-22 LAB — FERRITIN: Ferritin: 78 ng/mL (ref 11–307)

## 2020-02-22 MED ORDER — FUROSEMIDE 10 MG/ML IJ SOLN
40.0000 mg | Freq: Once | INTRAMUSCULAR | Status: AC
  Administered 2020-02-22: 40 mg via INTRAVENOUS
  Filled 2020-02-22: qty 4

## 2020-02-22 MED ORDER — CEFAZOLIN SODIUM-DEXTROSE 1-4 GM/50ML-% IV SOLN
1.0000 g | Freq: Three times a day (TID) | INTRAVENOUS | Status: DC
  Administered 2020-02-22 – 2020-02-27 (×15): 1 g via INTRAVENOUS
  Filled 2020-02-22 (×17): qty 50

## 2020-02-22 NOTE — Progress Notes (Signed)
CRITICAL CARE NOTE 70 y.o. female with history of MS, presented to [facility] via EMS on 15 February 2020.  Had been ill for approximately 2 weeks prior to admission.  She had a positive COVID-19 home test on 04 February 2020.  Patient was noted to be hypoxic in the field and placed on nonrebreather mask and transported to the ED.  Still was showing positive Covid test on admission.  PCCM consulted for February 2022 due to worsening respiratory failure.  Patient required intubation and mechanical ventilation.  Transfer to [facility] service.     Events: 02/18/2020-patient remains critically ill on MV wit PRVC, weaned FiO2 from 100 to 80% today. Sedation was light patient up sitting awake we advanced RASS goal to -3 to -4 for patient comfort.  02/19/2020-patient weaned to 70% today, no events today. Met with husband and reviewed hospital course, answered questions. He is happy and thankful for care. 02/20/2020- FiO2 down to 50%, PEEP at 8, still asynchronous when sedation is lightened 2/8 remains on vent  2/9 failed SAT/SBT, increased WOB    CC  follow up respiratory failure  SUBJECTIVE Patient remains critically ill Prognosis is guarded  Vent Mode: PRVC FiO2 (%):  [50 %-60 %] 60 % Set Rate:  [15 bmp] 15 bmp Vt Set:  [450 mL] 450 mL PEEP:  [5 cmH20] 5 cmH20 CBC    Component Value Date/Time   WBC 11.2 (H) 02/22/2020 0324   RBC 3.11 (L) 02/22/2020 0324   HGB 7.4 (L) 02/22/2020 0324   HGB 10.5 (L) 10/19/2019 1138   HCT 25.3 (L) 02/22/2020 0324   HCT 32.7 (L) 10/19/2019 1138   PLT 302 02/22/2020 0324   PLT 395 10/19/2019 1138   MCV 81.4 02/22/2020 0324   MCV 81 10/19/2019 1138   MCH 23.8 (L) 02/22/2020 0324   MCHC 29.2 (L) 02/22/2020 0324   RDW 17.8 (H) 02/22/2020 0324   RDW 15.6 (H) 10/19/2019 1138   LYMPHSABS 0.2 (L) 02/22/2020 0324   LYMPHSABS 0.9 10/19/2019 1138   MONOABS 0.6 02/22/2020 0324   EOSABS 0.0 02/22/2020 0324   EOSABS 0.1 10/19/2019 1138   BASOSABS 0.0 02/22/2020 0324    BASOSABS 0.1 10/19/2019 1138   BMP Latest Ref Rng & Units 02/22/2020 02/21/2020 02/20/2020  Glucose 70 - 99 mg/dL 128(H) 135(H) 137(H)  BUN 8 - 23 mg/dL 26(H) 29(H) 32(H)  Creatinine 0.44 - 1.00 mg/dL 0.57 0.63 0.78  BUN/Creat Ratio 12 - 28 - - -  Sodium 135 - 145 mmol/L 147(H) 146(H) 143  Potassium 3.5 - 5.1 mmol/L 4.9 4.9 4.8  Chloride 98 - 111 mmol/L 100 102 104  CO2 22 - 32 mmol/L 36(H) 35(H) 33(H)  Calcium 8.9 - 10.3 mg/dL 8.0(L) 8.0(L) 7.9(L)    BP 126/66   Pulse 82   Temp 100.1 F (37.8 C) (Oral)   Resp 13   Ht 5' 7"  (1.702 m)   Wt 82.5 kg   SpO2 91%   BMI 28.49 kg/m    I/O last 3 completed shifts: In: 1665.9 [I.V.:1665.9] Out: 3382 [Urine:3335] Total I/O In: -  Out: 180 [Urine:180]  SpO2: 91 % O2 Flow Rate (L/min): 15 L/min FiO2 (%): (S) 60 %  Estimated body mass index is 28.49 kg/m as calculated from the following:   Height as of this encounter: 5' 7"  (1.702 m).   Weight as of this encounter: 82.5 kg.  SIGNIFICANT EVENTS   REVIEW OF SYSTEMS  PATIENT IS UNABLE TO PROVIDE COMPLETE REVIEW OF SYSTEMS DUE TO  SEVERE CRITICAL ILLNESS       PHYSICAL EXAMINATION: GENERAL:critically ill appearing, +resp distress NECK: Supple.  PULMONARY: +rhonchi, +wheezing CARDIOVASCULAR: S1 and S2.  GASTROINTESTINAL: Soft, nontender, +Positive bowel sounds.  MUSCULOSKELETAL: No swelling, clubbing, or edema.  NEUROLOGIC: obtunded, GCS<8 SKIN:intact,warm,dry     MEDICATIONS: I have reviewed all medications and confirmed regimen as documented   CULTURE RESULTS   Recent Results (from the past 240 hour(s))  SARS CORONAVIRUS 2 (TAT 6-24 HRS)     Status: Abnormal   Collection Time: 02/24/2020  9:57 AM  Result Value Ref Range Status   SARS Coronavirus 2 POSITIVE (A) NEGATIVE Final    Comment: (NOTE) SARS-CoV-2 target nucleic acids are DETECTED.  The SARS-CoV-2 RNA is generally detectable in upper and lower respiratory specimens during the acute phase of infection.  Positive results are indicative of the presence of SARS-CoV-2 RNA. Clinical correlation with patient history and other diagnostic information is  necessary to determine patient infection status. Positive results do not rule out bacterial infection or co-infection with other viruses.  The expected result is Negative.  Fact Sheet for Patients: SugarRoll.be  Fact Sheet for Healthcare Providers: https://www.woods-mathews.com/  This test is not yet approved or cleared by the [place] FDA and  has been authorized for detection and/or diagnosis of SARS-CoV-2 by FDA under an Emergency Use Authorization (EUA). This EUA will remain  in effect (meaning this test can be used) for the duration of the COVID-19 declaration under Section 564(b)(1) of the Act, 21 U. S.C. section 360bbb-3(b)(1), unless the authorization is terminated or revoked sooner.   Performed at [facility], [place] [address], [place], [place] [address]   Blood culture (single)     Status: None   Collection Time: 02/18/2020 10:15 AM   Specimen: BLOOD  Result Value Ref Range Status   Specimen Description BLOOD LEFT AC  Final   Special Requests   Final    BOTTLES DRAWN AEROBIC AND ANAEROBIC Blood Culture results may not be optimal due to an inadequate volume of blood received in culture bottles   Culture   Final    NO GROWTH 5 DAYS Performed at [facility], [address], [place], [place] [address]    Report Status 02/20/2020 FINAL  Final  Culture, blood (routine x 2) Call MD if unable to obtain prior to antibiotics being given     Status: None   Collection Time: 02/24/2020  9:38 PM   Specimen: BLOOD  Result Value Ref Range Status   Specimen Description BLOOD LEFT ANTECUBITAL  Final   Special Requests   Final    BOTTLES DRAWN AEROBIC AND ANAEROBIC Blood Culture results may not be optimal due to an inadequate volume of blood received in culture bottles   Culture    Final    NO GROWTH 5 DAYS Performed at [facility], [place] [place],  [address]    Report Status 02/20/2020 FINAL  Final  Culture, blood (routine x 2) Call MD if unable to obtain prior to antibiotics being given     Status: None   Collection Time: 03/11/2020  9:38 PM   Specimen: BLOOD  Result Value Ref Range Status   Specimen Description BLOOD BLOOD RIGHT FOREARM  Final   Special Requests   Final    BOTTLES DRAWN AEROBIC AND ANAEROBIC Blood Culture adequate volume   Culture   Final    NO GROWTH 5 DAYS Performed at [facility], [place],  [place], [place] [address]    Report Status 02/20/2020 FINAL  Final  Culture, respiratory (non-expectorated)     Status: None (Preliminary result)   Collection Time: 02/18/20  1:49 PM   Specimen: Tracheal Aspirate; Respiratory  Result Value Ref Range Status   Specimen Description   Final    TRACHEAL ASPIRATE Performed at [facility], [address] [place], [place] [address]    Special Requests   Final    NONE Performed at [facility], [place], [place] [address]    Gram Stain   Final    ABUNDANT WBC PRESENT, PREDOMINANTLY PMN RARE GRAM POSITIVE COCCI Performed at [facility], [place] [address] [place], [place] [address]    Culture FEW CANDIDA ALBICANS RARE STAPHYLOCOCCUS AUREUS   Final   Report Status PENDING  Incomplete      Indwelling Urinary Catheter continued, requirement due to   Reason to continue Indwelling Urinary Catheter strict Intake/Output monitoring for hemodynamic instability         Ventilator continued, requirement due to severe respiratory failure   Ventilator Sedation RASS 0 to -2      ASSESSMENT AND PLAN SYNOPSIS 70 morbidly obese white female with severe and  Acute hypoxemic respiratory failure due to COVID-19 pneumonia with MSSA pneumonia  Mechanical ventilation via ARDS protocol, target PRVC 6 cc/kg Wean PEEP and FiO2  as able Goal plateau pressure less than 30, driving pressure less than 15 Paralytics if necessary for vent synchrony, gas exchange Cycle prone positioning if necessary for oxygenation Deep sedation per PAD protocol Diuresis as tolerated based on Kidney function VAP prevention order set S/p Remdesivir Therapy IV STEROIDS Therapy Follow inflammatory markers as needed with CRP Vitamin C, zinc Plan to repeat and check resp cultures as needed   Severe ACUTE Hypoxic and Hypercapnic Respiratory Failure -continue Full MV support -continue Bronchodilator Therapy -Wean Fio2 and PEEP as tolerated -VAP/VENT bundle implementation  ACUTE DIASTOLIC CARDIAC FAILURE-  -oxygen as needed -Lasix as tolerated   Obesity, possible OSA.   Will certainly impact respiratory mechanics, ventilator weaning Suspect will need to consider additional PEEP    NEUROLOGY Acute toxic metabolic encephalopathy, need for sedation Goal RASS -2 to -3   CARDIAC ICU monitoring  ID -continue IV abx as prescibed -follow up cultures MSSA trach aspirate-start ancef  GI GI PROPHYLAXIS as indicated   DIET-->TF's as tolerated Constipation protocol as indicated  ENDO - will use ICU hypoglycemic\Hyperglycemia protocol if indicated    ELECTROLYTES -follow labs as needed -replace as needed -pharmacy consultation and following   DVT/GI PRX ordered and assessed TRANSFUSIONS AS NEEDED MONITOR FSBS I Assessed the need for Labs I Assessed the need for Foley I Assessed the need for Central Venous Line Family Discussion when available I Assessed the need for Mobilization I made an Assessment of medications to be adjusted accordingly Safety Risk assessment completed   CASE DISCUSSED IN MULTIDISCIPLINARY ROUNDS WITH ICU TEAM  Critical Care Time devoted to patient care services described in this note is 55  minutes.   Overall, patient is critically ill, prognosis is guarded.  Patient with Multiorgan  failure and at high risk for cardiac arrest and death.    Corrin Parker, M.D.  [facility]  Medical Director [place] Director [facility] Cardio-Pulmonary Department

## 2020-02-22 NOTE — Progress Notes (Signed)
GOALS OF CARE DISCUSSION  The Clinical status was relayed to family in detail. Husband at bedside  Updated and notified of patients medical condition.  Patient remains unresponsive and will not open eyes to command.    Patient is having a weak cough and struggling to remove secretions.   Patient with increased WOB and using accessory muscles to breathe Explained to family course of therapy and the modalities     Patient with Progressive multiorgan failure with a very high probablity of a very minimal chance of meaningful recovery despite all aggressive and optimal medical therapy. Patient is in the Dying  Process associated with Suffering.  Family understands the situation.  They have consented and agreed to DNR  Family are satisfied with Plan of action and management. All questions answered  Additional CC time 32 mins   Hortencia Martire Santiago Glad, M.D.  [facility]  Medical Director [facility] Medical Director [facility] Cardio-Pulmonary Department

## 2020-02-22 NOTE — Progress Notes (Signed)
*  PRELIMINARY RESULTS* Echocardiogram 2D Echocardiogram has been performed.  Summer Holloway 02/22/2020, 10:55 AM

## 2020-02-23 DIAGNOSIS — U071 COVID-19: Secondary | ICD-10-CM | POA: Diagnosis not present

## 2020-02-23 DIAGNOSIS — J1282 Pneumonia due to coronavirus disease 2019: Secondary | ICD-10-CM | POA: Diagnosis not present

## 2020-02-23 DIAGNOSIS — J9600 Acute respiratory failure, unspecified whether with hypoxia or hypercapnia: Secondary | ICD-10-CM | POA: Diagnosis not present

## 2020-02-23 LAB — PHOSPHORUS: Phosphorus: 3.8 mg/dL (ref 2.5–4.6)

## 2020-02-23 LAB — CBC WITH DIFFERENTIAL/PLATELET
Abs Immature Granulocytes: 0.09 10*3/uL — ABNORMAL HIGH (ref 0.00–0.07)
Basophils Absolute: 0 10*3/uL (ref 0.0–0.1)
Basophils Relative: 0 %
Eosinophils Absolute: 0.3 10*3/uL (ref 0.0–0.5)
Eosinophils Relative: 3 %
HCT: 25.5 % — ABNORMAL LOW (ref 36.0–46.0)
Hemoglobin: 7.3 g/dL — ABNORMAL LOW (ref 12.0–15.0)
Immature Granulocytes: 1 %
Lymphocytes Relative: 5 %
Lymphs Abs: 0.7 10*3/uL (ref 0.7–4.0)
MCH: 23.1 pg — ABNORMAL LOW (ref 26.0–34.0)
MCHC: 28.6 g/dL — ABNORMAL LOW (ref 30.0–36.0)
MCV: 80.7 fL (ref 80.0–100.0)
Monocytes Absolute: 0.5 10*3/uL (ref 0.1–1.0)
Monocytes Relative: 4 %
Neutro Abs: 11.3 10*3/uL — ABNORMAL HIGH (ref 1.7–7.7)
Neutrophils Relative %: 87 %
Platelets: 317 10*3/uL (ref 150–400)
RBC: 3.16 MIL/uL — ABNORMAL LOW (ref 3.87–5.11)
RDW: 17.6 % — ABNORMAL HIGH (ref 11.5–15.5)
Smear Review: NORMAL
WBC: 12.8 10*3/uL — ABNORMAL HIGH (ref 4.0–10.5)
nRBC: 0.2 % (ref 0.0–0.2)

## 2020-02-23 LAB — COMPREHENSIVE METABOLIC PANEL
ALT: 10 U/L (ref 0–44)
AST: 28 U/L (ref 15–41)
Albumin: 1.6 g/dL — ABNORMAL LOW (ref 3.5–5.0)
Alkaline Phosphatase: 104 U/L (ref 38–126)
Anion gap: 8 (ref 5–15)
BUN: 32 mg/dL — ABNORMAL HIGH (ref 8–23)
CO2: 39 mmol/L — ABNORMAL HIGH (ref 22–32)
Calcium: 7.8 mg/dL — ABNORMAL LOW (ref 8.9–10.3)
Chloride: 94 mmol/L — ABNORMAL LOW (ref 98–111)
Creatinine, Ser: 0.63 mg/dL (ref 0.44–1.00)
GFR, Estimated: >60 mL/min (ref >60)
Glucose, Bld: 114 mg/dL — ABNORMAL HIGH (ref 70–99)
Potassium: 4.1 mmol/L (ref 3.5–5.1)
Sodium: 141 mmol/L (ref 135–145)
Total Bilirubin: 0.3 mg/dL (ref 0.3–1.2)
Total Protein: 5.1 g/dL — ABNORMAL LOW (ref 6.5–8.1)

## 2020-02-23 LAB — MAGNESIUM: Magnesium: 2.2 mg/dL (ref 1.7–2.4)

## 2020-02-23 LAB — C-REACTIVE PROTEIN: CRP: 23.5 mg/dL — ABNORMAL HIGH (ref <1.0)

## 2020-02-23 LAB — GLUCOSE, CAPILLARY
Glucose-Capillary: 119 mg/dL — ABNORMAL HIGH (ref 70–99)
Glucose-Capillary: 123 mg/dL — ABNORMAL HIGH (ref 70–99)
Glucose-Capillary: 142 mg/dL — ABNORMAL HIGH (ref 70–99)
Glucose-Capillary: 143 mg/dL — ABNORMAL HIGH (ref 70–99)
Glucose-Capillary: 150 mg/dL — ABNORMAL HIGH (ref 70–99)
Glucose-Capillary: 98 mg/dL (ref 70–99)

## 2020-02-23 LAB — FERRITIN: Ferritin: 98 ng/mL (ref 11–307)

## 2020-02-23 LAB — FIBRIN DERIVATIVES D-DIMER (ARMC ONLY): Fibrin derivatives D-dimer (ARMC): >7500 ng/mL (FEU) — ABNORMAL HIGH (ref 0.00–499.00)

## 2020-02-23 NOTE — Progress Notes (Signed)
CRITICAL CARE NOTE [name], presented to [facility] via EMS on 15 February 2020. Had been ill for approximately 2 weeks prior to admission. She had a positive COVID-19 home test on 04 February 2020. Patient was noted to be hypoxic in the field and placed on nonrebreather mask and transported to the ED. Still was showing positive Covid test on admission. PCCM consulted for February 2022 due to worsening respiratory failure. Patient required intubation and mechanical ventilation. Transfer to [facility] service.    Events: 02/18/2020-patient remains critically ill on MV wit PRVC, weaned FiO2 from 100 to 80% today. Sedation was light patient up sitting awake we advanced RASS goal to -3 to -4 for patient comfort.  02/19/2020-patient weaned to 70% today, no events today. Met with husband and reviewed hospital course, answered questions. He is happy and thankful for care. 02/20/2020-FiO2 down to 50%, PEEP at 8, still asynchronous when sedation is lightened 2/8 remains on vent  2/9 failed SAT/SBT, increased WOB, PATIENT DNR STATUS   CC  follow up respiratory failure  SUBJECTIVE Patient remains critically ill Prognosis is guarded   BP 121/64   Pulse 100   Temp 99.8 F (37.7 C) (Oral)   Resp 14   Ht _0  (1.702 m)   Wt 79 kg   SpO2 91%   BMI 27.28 kg/m    I/O last 3 completed shifts: In: 2126.6 [I.V.:1171.6; NG/GT:805; IV Piggyback:150] Out: 2965 [Urine:2895; Emesis/NG output:70] No intake/output data recorded.  SpO2: 91 % O2 Flow Rate (L/min): 15 L/min FiO2 (%): 60 %  Estimated body mass index is 27.28 kg/m as calculated from the following:   Height as of this encounter: _1  (1.702 m).   Weight as of this encounter: 79 kg.  SIGNIFICANT EVENTS   REVIEW OF SYSTEMS  PATIENT IS UNABLE TO PROVIDE COMPLETE REVIEW OF SYSTEMS DUE TO SEVERE CRITICAL ILLNESS       PHYSICAL EXAMINATION: GENERAL:critically ill appearing, +resp distress NECK: Supple.   PULMONARY: +rhonchi, +wheezing CARDIOVASCULAR: S1 and S2.  GASTROINTESTINAL: Soft, nontender, +Positive bowel sounds.  MUSCULOSKELETAL: No swelling, clubbing, or edema.  NEUROLOGIC: obtunded, GCS<8 SKIN:intact,warm,dry     MEDICATIONS: I have reviewed all medications and confirmed regimen as documented   CULTURE RESULTS   Recent Results (from the past 240 hour(s))  SARS CORONAVIRUS 2 (TAT 6-24 HRS)     Status: Abnormal   Collection Time: 03/09/2020  9:57 AM  Result Value Ref Range Status   SARS Coronavirus 2 POSITIVE (A) NEGATIVE Final    Comment: (NOTE) SARS-CoV-2 target nucleic acids are DETECTED.  The SARS-CoV-2 RNA is generally detectable in upper and lower respiratory specimens during the acute phase of infection. Positive results are indicative of the presence of SARS-CoV-2 RNA. Clinical correlation with patient history and other diagnostic information is  necessary to determine patient infection status. Positive results do not rule out bacterial infection or co-infection with other viruses.  The expected result is Negative.  Fact Sheet for Patients: SugarRoll.be  Fact Sheet for Healthcare Providers: https://www.woods-mathews.com/  This test is not yet approved or cleared by the [place] FDA and  has been authorized for detection and/or diagnosis of SARS-CoV-2 by FDA under an Emergency Use Authorization (EUA). This EUA will remain  in effect (meaning this test can be used) for the duration of the COVID-19 declaration under Section 564(b)(1) of the Act, 21 U. S.C. section 360bbb-3(b)(1), unless the authorization is terminated or revoked sooner.   Performed at [facility], [address] [place], [place] [address]   Blood culture (single)     Status: None   Collection Time: 02/20/2020 10:15 AM   Specimen: BLOOD  Result Value Ref Range Status   Specimen Description BLOOD LEFT AC  Final   Special Requests    Final    BOTTLES DRAWN AEROBIC AND ANAEROBIC Blood Culture results may not be optimal due to an inadequate volume of blood received in culture bottles   Culture   Final    NO GROWTH 5 DAYS Performed at [facility], [address], [place], [place] [address]    Report Status 02/20/2020 FINAL  Final  Culture, blood (routine x 2) Call MD if unable to obtain prior to antibiotics being given     Status: None   Collection Time: 02/14/2020  9:38 PM   Specimen: BLOOD  Result Value Ref Range Status   Specimen Description BLOOD LEFT ANTECUBITAL  Final   Special Requests   Final    BOTTLES DRAWN AEROBIC AND ANAEROBIC Blood Culture results may not be optimal due to an inadequate volume of blood received in culture bottles   Culture   Final    NO GROWTH 5 DAYS Performed at [facility], [address] [place], [place] [address]    Report Status 02/20/2020 FINAL  Final  Culture, blood (routine x 2) Call MD if unable to obtain prior to antibiotics being given     Status: None   Collection Time: 02/16/2020  9:38 PM   Specimen: BLOOD  Result Value Ref Range Status   Specimen Description BLOOD BLOOD RIGHT FOREARM  Final   Special Requests   Final    BOTTLES DRAWN AEROBIC AND ANAEROBIC Blood Culture adequate volume   Culture   Final    NO GROWTH 5 DAYS Performed at [facility], [address], [place], [place] [address]    Report Status 02/20/2020 FINAL  Final  Culture, respiratory (non-expectorated)     Status: None   Collection Time: 02/18/20  1:49 PM   Specimen: Tracheal Aspirate; Respiratory  Result Value Ref Range Status   Specimen Description   Final    TRACHEAL ASPIRATE Performed at [facility], [address], [place], [place] [address]    Special Requests   Final    NONE Performed at [facility], [address] [place], [place] [address]    Gram Stain   Final    ABUNDANT WBC PRESENT, PREDOMINANTLY PMN RARE GRAM POSITIVE COCCI     Culture   Final    FEW CANDIDA ALBICANS RARE STAPHYLOCOCCUS AUREUS No Pseudomonas species isolated Performed at [facility] [address], [place], [place] [address]    Report Status 02/22/2020 FINAL  Final   Organism ID, Bacteria STAPHYLOCOCCUS AUREUS  Final      Susceptibility   Staphylococcus aureus - MIC*    CIPROFLOXACIN <=0.5 SENSITIVE Sensitive     ERYTHROMYCIN <=0.25 SENSITIVE Sensitive     GENTAMICIN <=0.5 SENSITIVE Sensitive     OXACILLIN 0.5 SENSITIVE Sensitive     TETRACYCLINE <=1 SENSITIVE Sensitive     VANCOMYCIN 1 SENSITIVE Sensitive     TRIMETH/SULFA <=10 SENSITIVE Sensitive     CLINDAMYCIN <=0.25 SENSITIVE Sensitive     RIFAMPIN <=0.5 SENSITIVE Sensitive     Inducible Clindamycin NEGATIVE Sensitive     * RARE STAPHYLOCOCCUS AUREUS          IMAGING    ECHOCARDIOGRAM COMPLETE  Result Date: 02/22/2020  ECHOCARDIOGRAM REPORT   Patient Name:   Laurina Bustle Date of Exam: 02/22/2020 Medical Rec #:  [id number]           Height:       67.0 in Accession #:    [id number]          Weight:       181.9 lb Date of Birth:  31-Mar-1950           BSA:          1.942 m Patient Age:    22 years            BP:           133/65 mmHg Patient Gender: F                   HR:           103 bpm. Exam Location:  [facility] Procedure: 2D Echo, Color Doppler and Cardiac Doppler Indications:     I42.9 Cardiomyopathy-unspecified  History:         Patient has no prior history of Echocardiogram examinations.                  Risk Factors:Hypertension and Dyslipidemia. Pt tested positive                  for COVID-19 on 02/04/20.  Sonographer:     Charmayne Sheer RDCS (AE) Referring Phys:  [id number] Diagnosing Phys: Kate Sable MD  Sonographer Comments: Echo performed with patient supine and on artificial respirator, suboptimal apical window and no subcostal window. Image acquisition challenging due to patient body habitus. IMPRESSIONS  1. Left ventricular ejection fraction, by  estimation, is 60 to 65%. The left ventricle has normal function. The left ventricle has no regional wall motion abnormalities. Left ventricular diastolic parameters are consistent with Grade I diastolic dysfunction (impaired relaxation).  2. Right ventricular systolic function is normal. The right ventricular size is normal.  3. The mitral valve is normal in structure. No evidence of mitral valve regurgitation.  4. The aortic valve is tricuspid. Aortic valve regurgitation is not visualized. FINDINGS  Left Ventricle: Left ventricular ejection fraction, by estimation, is 60 to 65%. The left ventricle has normal function. The left ventricle has no regional wall motion abnormalities. The left ventricular internal cavity size was normal in size. There is  no left ventricular hypertrophy. Left ventricular diastolic parameters are consistent with Grade I diastolic dysfunction (impaired relaxation). Right Ventricle: The right ventricular size is normal. No increase in right ventricular wall thickness. Right ventricular systolic function is normal. Left Atrium: Left atrial size was normal in size. Right Atrium: Right atrial size was normal in size. Pericardium: There is no evidence of pericardial effusion. Mitral Valve: The mitral valve is normal in structure. No evidence of mitral valve regurgitation. MV peak gradient, 7.4 mmHg. The mean mitral valve gradient is 4.0 mmHg. Tricuspid Valve: The tricuspid valve is normal in structure. Tricuspid valve regurgitation is not demonstrated. Aortic Valve: The aortic valve is tricuspid. Aortic valve regurgitation is not visualized. Aortic valve mean gradient measures 6.0 mmHg. Aortic valve peak gradient measures 13.2 mmHg. Aortic valve area, by VTI measures 2.65 cm. Pulmonic Valve: The pulmonic valve was normal in structure. Pulmonic valve regurgitation is not visualized. Aorta: The aortic root is normal in size and structure. Venous: The inferior vena cava was not well visualized.  IAS/Shunts: No atrial level shunt detected by color flow Doppler.  LEFT VENTRICLE PLAX 2D LVIDd:         4.10 cm  Diastology LVIDs:         2.10 cm  LV e' lateral:   10.40 cm/s LV PW:         1.00 cm  LV E/e' lateral: 8.5 LV IVS:        0.90 cm LVOT diam:     2.00 cm LV SV:         68 LV SV Index:   35 LVOT Area:     3.14 cm  RIGHT VENTRICLE RV Basal diam:  2.60 cm RV Mid diam:    2.80 cm TAPSE (M-mode): 2.2 cm LEFT ATRIUM             Index       RIGHT ATRIUM           Index LA diam:        3.50 cm 1.80 cm/m  RA Area:     11.10 cm LA Vol (A2C):   35.6 ml 18.33 ml/m RA Volume:   22.80 ml  11.74 ml/m LA Vol (A4C):   59.5 ml 30.63 ml/m LA Biplane Vol: 49.0 ml 25.23 ml/m  AORTIC VALVE                    PULMONIC VALVE AV Area (Vmax):    2.30 cm     PV Vmax:       1.45 m/s AV Area (Vmean):   2.53 cm     PV Vmean:      98.900 cm/s AV Area (VTI):     2.65 cm     PV VTI:        0.247 m AV Vmax:           182.00 cm/s  PV Peak grad:  8.4 mmHg AV Vmean:          113.000 cm/s PV Mean grad:  5.0 mmHg AV VTI:            0.256 m AV Peak Grad:      13.2 mmHg AV Mean Grad:      6.0 mmHg LVOT Vmax:         133.00 cm/s LVOT Vmean:        91.000 cm/s LVOT VTI:          0.216 m LVOT/AV VTI ratio: 0.84  AORTA Ao Root diam: 2.90 cm MITRAL VALVE MV Area (PHT): 9.14 cm     SHUNTS MV Area VTI:   3.15 cm     Systemic VTI:  0.22 m MV Peak grad:  7.4 mmHg     Systemic Diam: 2.00 cm MV Mean grad:  4.0 mmHg MV Vmax:       1.36 m/s MV Vmean:      92.5 cm/s MV Decel Time: 83 msec MV E velocity: 88.80 cm/s MV A velocity: 107.00 cm/s MV E/A ratio:  0.83 Kate Sable MD Electronically signed by Kate Sable MD Signature Date/Time: 02/22/2020/5:58:32 PM    Final      Nutrition Status: Nutrition Problem: Inadequate oral intake Etiology: inability to eat (pt sedated and ventilated) Signs/Symptoms: NPO status       Indwelling Urinary Catheter continued, requirement due to   Reason to continue Indwelling Urinary Catheter  strict Intake/Output monitoring for hemodynamic instability   Central Line/ continued, requirement due to  Reason to continue [place] of central venous pressure or other hemodynamic parameters and poor  IV access   Ventilator continued, requirement due to severe respiratory failure   Ventilator Sedation RASS 0 to -2      ASSESSMENT AND PLAN SYNOPSIS 70 morbidly obese white female with severe and  Acute hypoxemic respiratory failure due to COVID-19 pneumonia with MSSA pneumonia COMPLICATED BY PROGRESSIVE MULTIPLE SCLEROSIS  AFTER DISCUSSION WITH HUSBAND YESTERDAY, PLAN MAYBE TO PLAN FOR ONE WAY EXTUBATION  Acute hypoxemic respiratory failure due to COVID-19 pneumonia Mechanical ventilation via ARDS protocol, target PRVC 6 cc/kg Wean PEEP and FiO2 as able Goal plateau pressure less than 30, driving pressure less than 15 Paralytics if necessary for vent synchrony, gas exchange Cycle prone positioning if necessary for oxygenation Deep sedation per PAD protocol Diuresis as tolerated based on Kidney function VAP prevention order set IV STEROIDS Therapy Follow inflammatory markers as needed with CRP Vitamin C, zinc Plan to repeat and check resp cultures as needed   Severe ACUTE Hypoxic and Hypercapnic Respiratory Failure -continue Full MV support -continue Bronchodilator Therapy -Wean Fio2 and PEEP as tolerated -VAP/VENT bundle implementation  ACUTE DIASTOLIC CARDIAC FAILURE-  -oxygen as needed -Lasix as tolerated   Morbid obesity, possible OSA.   Will certainly impact respiratory mechanics, ventilator weaning Suspect will need to consider additional PEEP   ACUTE KIDNEY INJURY/Renal Failure -continue Foley Catheter-assess need -Avoid nephrotoxic agents -Follow urine output, BMP -Ensure adequate renal perfusion, optimize oxygenation -Renal dose medications     NEUROLOGY Acute toxic metabolic encephalopathy, need for sedation Goal RASS -2 to  -3   CARDIAC ICU monitoring  ID -continue IV abx as prescibed -follow up cultures  GI GI PROPHYLAXIS as indicated   DIET-->TF's as tolerated Constipation protocol as indicated  ENDO - will use ICU hypoglycemic\Hyperglycemia protocol if indicated    ELECTROLYTES -follow labs as needed -replace as needed -pharmacy consultation and following   DVT/GI PRX ordered and assessed TRANSFUSIONS AS NEEDED MONITOR FSBS I Assessed the need for Labs I Assessed the need for Foley I Assessed the need for Central Venous Line Family Discussion when available I Assessed the need for Mobilization I made an Assessment of medications to be adjusted accordingly Safety Risk assessment completed   CASE DISCUSSED IN MULTIDISCIPLINARY ROUNDS WITH ICU TEAM  Critical Care Time devoted to patient care services described in this note is 42 minutes.   Overall, patient is critically ill, prognosis is guarded.  Patient with Multiorgan failure and at high risk for cardiac arrest and death.    Corrin Parker, M.D.  [facility]  Medical Director [place] Director [facility] Cardio-Pulmonary Department

## 2020-02-24 DIAGNOSIS — U071 COVID-19: Secondary | ICD-10-CM | POA: Diagnosis not present

## 2020-02-24 DIAGNOSIS — J1282 Pneumonia due to coronavirus disease 2019: Secondary | ICD-10-CM | POA: Diagnosis not present

## 2020-02-24 DIAGNOSIS — J9600 Acute respiratory failure, unspecified whether with hypoxia or hypercapnia: Secondary | ICD-10-CM | POA: Diagnosis not present

## 2020-02-24 LAB — CBC WITH DIFFERENTIAL/PLATELET
Abs Immature Granulocytes: 0.12 10*3/uL — ABNORMAL HIGH (ref 0.00–0.07)
Basophils Absolute: 0 10*3/uL (ref 0.0–0.1)
Basophils Relative: 0 %
Eosinophils Absolute: 0 10*3/uL (ref 0.0–0.5)
Eosinophils Relative: 0 %
HCT: 24.7 % — ABNORMAL LOW (ref 36.0–46.0)
Hemoglobin: 7.1 g/dL — ABNORMAL LOW (ref 12.0–15.0)
Immature Granulocytes: 1 %
Lymphocytes Relative: 2 %
Lymphs Abs: 0.3 10*3/uL — ABNORMAL LOW (ref 0.7–4.0)
MCH: 23.3 pg — ABNORMAL LOW (ref 26.0–34.0)
MCHC: 28.7 g/dL — ABNORMAL LOW (ref 30.0–36.0)
MCV: 81 fL (ref 80.0–100.0)
Monocytes Absolute: 0.5 10*3/uL (ref 0.1–1.0)
Monocytes Relative: 4 %
Neutro Abs: 11.2 10*3/uL — ABNORMAL HIGH (ref 1.7–7.7)
Neutrophils Relative %: 93 %
Platelets: 353 10*3/uL (ref 150–400)
RBC: 3.05 MIL/uL — ABNORMAL LOW (ref 3.87–5.11)
RDW: 17.7 % — ABNORMAL HIGH (ref 11.5–15.5)
WBC: 12.1 10*3/uL — ABNORMAL HIGH (ref 4.0–10.5)
nRBC: 0.2 % (ref 0.0–0.2)

## 2020-02-24 LAB — COMPREHENSIVE METABOLIC PANEL
ALT: 12 U/L (ref 0–44)
AST: 38 U/L (ref 15–41)
Albumin: 1.7 g/dL — ABNORMAL LOW (ref 3.5–5.0)
Alkaline Phosphatase: 101 U/L (ref 38–126)
Anion gap: 11 (ref 5–15)
BUN: 31 mg/dL — ABNORMAL HIGH (ref 8–23)
CO2: 38 mmol/L — ABNORMAL HIGH (ref 22–32)
Calcium: 8.3 mg/dL — ABNORMAL LOW (ref 8.9–10.3)
Chloride: 95 mmol/L — ABNORMAL LOW (ref 98–111)
Creatinine, Ser: 0.57 mg/dL (ref 0.44–1.00)
GFR, Estimated: >60 mL/min (ref >60)
Glucose, Bld: 128 mg/dL — ABNORMAL HIGH (ref 70–99)
Potassium: 5.1 mmol/L (ref 3.5–5.1)
Sodium: 144 mmol/L (ref 135–145)
Total Bilirubin: 0.3 mg/dL (ref 0.3–1.2)
Total Protein: 5.3 g/dL — ABNORMAL LOW (ref 6.5–8.1)

## 2020-02-24 LAB — MAGNESIUM: Magnesium: 2.1 mg/dL (ref 1.7–2.4)

## 2020-02-24 LAB — GLUCOSE, CAPILLARY
Glucose-Capillary: 113 mg/dL — ABNORMAL HIGH (ref 70–99)
Glucose-Capillary: 115 mg/dL — ABNORMAL HIGH (ref 70–99)
Glucose-Capillary: 117 mg/dL — ABNORMAL HIGH (ref 70–99)
Glucose-Capillary: 124 mg/dL — ABNORMAL HIGH (ref 70–99)
Glucose-Capillary: 130 mg/dL — ABNORMAL HIGH (ref 70–99)
Glucose-Capillary: 134 mg/dL — ABNORMAL HIGH (ref 70–99)

## 2020-02-24 LAB — C-REACTIVE PROTEIN: CRP: 27.5 mg/dL — ABNORMAL HIGH (ref <1.0)

## 2020-02-24 LAB — FIBRIN DERIVATIVES D-DIMER (ARMC ONLY): Fibrin derivatives D-dimer (ARMC): 6329.64 ng/mL (FEU) — ABNORMAL HIGH (ref 0.00–499.00)

## 2020-02-24 LAB — PHOSPHORUS: Phosphorus: 3.4 mg/dL (ref 2.5–4.6)

## 2020-02-24 LAB — FERRITIN: Ferritin: 135 ng/mL (ref 11–307)

## 2020-02-24 MED ORDER — LACTULOSE 10 GM/15ML PO SOLN
10.0000 g | Freq: Two times a day (BID) | ORAL | Status: DC
  Administered 2020-02-24 – 2020-02-28 (×5): 10 g
  Filled 2020-02-24 (×7): qty 30

## 2020-02-24 NOTE — Progress Notes (Signed)
GOALS OF CARE DISCUSSION  The Clinical status was relayed to family in detail. Husband  Updated and notified of patients medical condition.  Patient remains unresponsive and will not open eyes to command.    Patient is having a weak cough and struggling to remove secretions.   Patient with increased WOB and using accessory muscles to breathe Explained to family course of therapy and the modalities     Patient with Progressive multiorgan failure with a very high probablity of a very minimal chance of meaningful recovery despite all aggressive and optimal medical therapy. Patient is in the Dying  Process associated with Suffering.  Family understands the situation.  Patient remains DNR status, will meet with Husband and Children tomorrow to address GOC.   Family are satisfied with Plan of action and management. All questions answered  Additional CC time 32 mins   Natsuko Kelsay Santiago Glad, M.D.  [facility]  Medical Director [facility] Medical Director [facility] Cardio-Pulmonary Department

## 2020-02-24 NOTE — Progress Notes (Signed)
CRITICAL CARE NOTE [name], presented to [facility] via EMS on 15 February 2020. Had been ill for approximately 2 weeks prior to admission. She had a positive COVID-19 home test on 04 February 2020. Patient was noted to be hypoxic in the field and placed on nonrebreather mask and transported to the ED. Still was showing positive Covid test on admission. PCCM consulted for February 2022 due to worsening respiratory failure. Patient required intubation and mechanical ventilation. Transfer to [facility] service.    Events: 02/18/2020-patient remains critically ill on MV wit PRVC, weaned FiO2 from 100 to 80% today. Sedation was light patient up sitting awake we advanced RASS goal to -3 to -4 for patient comfort.  02/19/2020-patient weaned to 70% today, no events today. Met with husband and reviewed hospital course, answered questions. He is happy and thankful for care. 02/20/2020-FiO2 down to 50%, PEEP at 8, still asynchronous when sedation is lightened 2/8 remains on vent 2/9 failed SAT/SBT, increased WOB, PATIENT DNR STATUS Dx of MSSA PNEUMONIA 2/10 severe hypoxia, unable to wean from vent  CC  follow up respiratory failure  SUBJECTIVE Patient remains critically ill Prognosis is guarded  Vent Mode: PRVC FiO2 (%):  [50 %-60 %] 50 % Set Rate:  [15 bmp] 15 bmp Vt Set:  [450 mL] 450 mL PEEP:  [5 cmH20] 5 cmH20 Plateau Pressure:  [15 cmH20-22 cmH20] 22 cmH20 CBC    Component Value Date/Time   WBC 12.1 (H) 02/24/2020 0423   RBC 3.05 (L) 02/24/2020 0423   HGB 7.1 (L) 02/24/2020 0423   HGB 10.5 (L) 10/19/2019 1138   HCT 24.7 (L) 02/24/2020 0423   HCT 32.7 (L) 10/19/2019 1138   PLT 353 02/24/2020 0423   PLT 395 10/19/2019 1138   MCV 81.0 02/24/2020 0423   MCV 81 10/19/2019 1138   MCH 23.3 (L) 02/24/2020 0423   MCHC 28.7 (L) 02/24/2020 0423   RDW 17.7 (H) 02/24/2020 0423   RDW 15.6 (H) 10/19/2019 1138   LYMPHSABS 0.3 (L) 02/24/2020 0423   LYMPHSABS 0.9  10/19/2019 1138   MONOABS 0.5 02/24/2020 0423   EOSABS 0.0 02/24/2020 0423   EOSABS 0.1 10/19/2019 1138   BASOSABS 0.0 02/24/2020 0423   BASOSABS 0.1 10/19/2019 1138   BMP Latest Ref Rng & Units 02/24/2020 02/23/2020 02/22/2020  Glucose 70 - 99 mg/dL 128(H) 114(H) 128(H)  BUN 8 - 23 mg/dL 31(H) 32(H) 26(H)  Creatinine 0.44 - 1.00 mg/dL 0.57 0.63 0.57  BUN/Creat Ratio 12 - 28 - - -  Sodium 135 - 145 mmol/L 144 141 147(H)  Potassium 3.5 - 5.1 mmol/L 5.1 4.1 4.9  Chloride 98 - 111 mmol/L 95(L) 94(L) 100  CO2 22 - 32 mmol/L 38(H) 39(H) 36(H)  Calcium 8.9 - 10.3 mg/dL 8.3(L) 7.8(L) 8.0(L)    BP (!) 108/54   Pulse 79   Temp 98.24 F (36.8 C)   Resp (!) 5   Ht 5' 7"  (1.702 m)   Wt 79.2 kg   SpO2 97%   BMI 27.35 kg/m    I/O last 3 completed shifts: In: 3761.6 [I.V.:1065.1; Other:200; NG/GT:2305; IV Piggyback:191.5] Out: 2190 [Urine:2120; Emesis/NG output:70] No intake/output data recorded.  SpO2: 97 % O2 Flow Rate (L/min): 15 L/min FiO2 (%): (S) 50 %  Estimated body mass index is 27.35 kg/m as calculated from the following:   Height as of this encounter: 5' 7"  (1.702 m).   Weight as of this encounter: 79.2 kg.  SIGNIFICANT EVENTS   REVIEW OF SYSTEMS  PATIENT IS UNABLE TO PROVIDE COMPLETE REVIEW OF SYSTEMS DUE TO SEVERE CRITICAL ILLNESS        PHYSICAL EXAMINATION:  GENERAL:critically ill appearing, +resp distress EYES: Pupils equal, round, reactive to light.  No scleral icterus.  PULMONARY: +rhonchi, +wheezing CARDIOVASCULAR: S1 and S2. Regular rate and rhythm. No murmurs, rubs, or gallops.  GASTROINTESTINAL: Soft, nontender, -distended.  Positive bowel sounds.   MUSCULOSKELETAL:+ edema.  NEUROLOGIC: obtunded, GCS<8 SKIN:intact,warm,dry  MEDICATIONS: I have reviewed all medications and confirmed regimen as documented   CULTURE RESULTS   Recent Results (from the past 240 hour(s))  SARS CORONAVIRUS 2 (TAT 6-24 HRS)     Status: Abnormal   Collection Time:  03/08/2020  9:57 AM  Result Value Ref Range Status   SARS Coronavirus 2 POSITIVE (A) NEGATIVE Final    Comment: (NOTE) SARS-CoV-2 target nucleic acids are DETECTED.  The SARS-CoV-2 RNA is generally detectable in upper and lower respiratory specimens during the acute phase of infection. Positive results are indicative of the presence of SARS-CoV-2 RNA. Clinical correlation with patient history and other diagnostic information is  necessary to determine patient infection status. Positive results do not rule out bacterial infection or co-infection with other viruses.  The expected result is Negative.  Fact Sheet for Patients: SugarRoll.be  Fact Sheet for Healthcare Providers: https://www.woods-mathews.com/  This test is not yet approved or cleared by the [place] FDA and  has been authorized for detection and/or diagnosis of SARS-CoV-2 by FDA under an Emergency Use Authorization (EUA). This EUA will remain  in effect (meaning this test can be used) for the duration of the COVID-19 declaration under Section 564(b)(1) of the Act, 21 U. S.C. section 360bbb-3(b)(1), unless the authorization is terminated or revoked sooner.   Performed at [facility], [place] [address] [place], [place] [address]   Blood culture (single)     Status: None   Collection Time: 02/16/2020 10:15 AM   Specimen: BLOOD  Result Value Ref Range Status   Specimen Description BLOOD LEFT AC  Final   Special Requests   Final    BOTTLES DRAWN AEROBIC AND ANAEROBIC Blood Culture results may not be optimal due to an inadequate volume of blood received in culture bottles   Culture   Final    NO GROWTH 5 DAYS Performed at [facility], [address] [place], [place] [address]    Report Status 02/20/2020 FINAL  Final  Culture, blood (routine x 2) Call MD if unable to obtain prior to antibiotics being given     Status: None   Collection Time: 03/09/2020  9:38 PM    Specimen: BLOOD  Result Value Ref Range Status   Specimen Description BLOOD LEFT ANTECUBITAL  Final   Special Requests   Final    BOTTLES DRAWN AEROBIC AND ANAEROBIC Blood Culture results may not be optimal due to an inadequate volume of blood received in culture bottles   Culture   Final    NO GROWTH 5 DAYS Performed at [facility], [address] [place], [place] [address]    Report Status 02/20/2020 FINAL  Final  Culture, blood (routine x 2) Call MD if unable to obtain prior to antibiotics being given     Status: None   Collection Time: 03/10/2020  9:38 PM   Specimen: BLOOD  Result Value Ref Range Status   Specimen Description BLOOD BLOOD RIGHT FOREARM  Final   Special Requests   Final    BOTTLES DRAWN AEROBIC AND  ANAEROBIC Blood Culture adequate volume   Culture   Final    NO GROWTH 5 DAYS Performed at [facility], [address] [place], [place] [address]    Report Status 02/20/2020 FINAL  Final  Culture, respiratory (non-expectorated)     Status: None   Collection Time: 02/18/20  1:49 PM   Specimen: Tracheal Aspirate; Respiratory  Result Value Ref Range Status   Specimen Description   Final    TRACHEAL ASPIRATE Performed at [facility], [address] [place], [place] [address]    Special Requests   Final    NONE Performed at [facility], [address] [place], [place] [address]    Gram Stain   Final    ABUNDANT WBC PRESENT, PREDOMINANTLY PMN RARE GRAM POSITIVE COCCI    Culture   Final    FEW CANDIDA ALBICANS RARE STAPHYLOCOCCUS AUREUS No Pseudomonas species isolated Performed at [facility] [address], [place], [place] [address]    Report Status 02/22/2020 FINAL  Final   Organism ID, Bacteria STAPHYLOCOCCUS AUREUS  Final      Susceptibility   Staphylococcus aureus - MIC*    CIPROFLOXACIN <=0.5 SENSITIVE Sensitive     ERYTHROMYCIN <=0.25 SENSITIVE Sensitive     GENTAMICIN <=0.5 SENSITIVE Sensitive      OXACILLIN 0.5 SENSITIVE Sensitive     TETRACYCLINE <=1 SENSITIVE Sensitive     VANCOMYCIN 1 SENSITIVE Sensitive     TRIMETH/SULFA <=10 SENSITIVE Sensitive     CLINDAMYCIN <=0.25 SENSITIVE Sensitive     RIFAMPIN <=0.5 SENSITIVE Sensitive     Inducible Clindamycin NEGATIVE Sensitive     * RARE STAPHYLOCOCCUS AUREUS          IMAGING    No results found.   Nutrition Status: Nutrition Problem: Inadequate oral intake Etiology: inability to eat (pt sedated and ventilated) Signs/Symptoms: NPO status       Indwelling Urinary Catheter continued, requirement due to   Reason to continue Indwelling Urinary Catheter strict Intake/Output monitoring for hemodynamic instability   Central Line/ continued, requirement due to  Reason to continue [place] of central venous pressure or other hemodynamic parameters and poor IV access   Ventilator continued, requirement due to severe respiratory failure   Ventilator Sedation RASS 0 to -2      ASSESSMENT AND PLAN SYNOPSIS 70 morbidly obese white female with severe and Acute hypoxemic respiratory failure due to COVID-19 pneumoniawith MSSA pneumonia COMPLICATED BY PROGRESSIVE MULTIPLE SCLEROSIS  Severe ACUTE Hypoxic and Hypercapnic Respiratory Failure -continue Full MV support -continue Bronchodilator Therapy -Wean Fio2 and PEEP as tolerated Unable to wean due to severe hypoxia -VAP/VENT bundle implementation  ACUTE DIASTOLIC CARDIAC FAILURE- EF -oxygen as needed -Lasix as tolerated   Morbid obesity, possible OSA.   Will certainly impact respiratory mechanics, ventilator weaning Suspect will need to consider additional PEEP  NEUROLOGY Acute toxic metabolic encephalopathy, need for sedation Goal RASS -2 to -3 Progressive MS very poor prognosis  SHOCK-SEPSIS -use vasopressors to keep MAP>65 as needed  CARDIAC ICU monitoring  ID -continue IV abx as prescibed -follow up cultures  GI GI PROPHYLAXIS  as indicated   DIET-->TF's as tolerated Constipation protocol as indicated  ENDO - will use ICU hypoglycemic\Hyperglycemia protocol if indicated     ELECTROLYTES -follow labs as needed -replace as needed -pharmacy consultation and following   DVT/GI PRX ordered and assessed TRANSFUSIONS AS NEEDED MONITOR FSBS I Assessed the need for Labs I Assessed the need for  Foley I Assessed the need for Central Venous Line Family Discussion when available I Assessed the need for Mobilization I made an Assessment of medications to be adjusted accordingly Safety Risk assessment completed   CASE DISCUSSED IN MULTIDISCIPLINARY ROUNDS WITH ICU TEAM  Critical Care Time devoted to patient care services described in this note is 46 minutes.   Overall, patient is critically ill, prognosis is guarded.  Patient with Multiorgan failure and at high risk for cardiac arrest and death.    Corrin Parker, M.D.  [facility]  Medical Director [place] Director [facility] Cardio-Pulmonary Department

## 2020-02-25 DIAGNOSIS — F418 Other specified anxiety disorders: Secondary | ICD-10-CM | POA: Diagnosis not present

## 2020-02-25 DIAGNOSIS — U071 COVID-19: Secondary | ICD-10-CM | POA: Diagnosis not present

## 2020-02-25 DIAGNOSIS — J9600 Acute respiratory failure, unspecified whether with hypoxia or hypercapnia: Secondary | ICD-10-CM | POA: Diagnosis not present

## 2020-02-25 DIAGNOSIS — J1282 Pneumonia due to coronavirus disease 2019: Secondary | ICD-10-CM | POA: Diagnosis not present

## 2020-02-25 LAB — COMPREHENSIVE METABOLIC PANEL
ALT: 17 U/L (ref 0–44)
AST: 55 U/L — ABNORMAL HIGH (ref 15–41)
Albumin: 1.6 g/dL — ABNORMAL LOW (ref 3.5–5.0)
Alkaline Phosphatase: 108 U/L (ref 38–126)
Anion gap: 10 (ref 5–15)
BUN: 32 mg/dL — ABNORMAL HIGH (ref 8–23)
CO2: 39 mmol/L — ABNORMAL HIGH (ref 22–32)
Calcium: 8.3 mg/dL — ABNORMAL LOW (ref 8.9–10.3)
Chloride: 95 mmol/L — ABNORMAL LOW (ref 98–111)
Creatinine, Ser: 0.47 mg/dL (ref 0.44–1.00)
GFR, Estimated: >60 mL/min (ref >60)
Glucose, Bld: 106 mg/dL — ABNORMAL HIGH (ref 70–99)
Potassium: 5.1 mmol/L (ref 3.5–5.1)
Sodium: 144 mmol/L (ref 135–145)
Total Bilirubin: 0.2 mg/dL — ABNORMAL LOW (ref 0.3–1.2)
Total Protein: 5.2 g/dL — ABNORMAL LOW (ref 6.5–8.1)

## 2020-02-25 LAB — GLUCOSE, CAPILLARY
Glucose-Capillary: 124 mg/dL — ABNORMAL HIGH (ref 70–99)
Glucose-Capillary: 106 mg/dL — ABNORMAL HIGH (ref 70–99)
Glucose-Capillary: 93 mg/dL (ref 70–99)
Glucose-Capillary: 90 mg/dL (ref 70–99)
Glucose-Capillary: 113 mg/dL — ABNORMAL HIGH (ref 70–99)
Glucose-Capillary: 127 mg/dL — ABNORMAL HIGH (ref 70–99)

## 2020-02-25 LAB — CBC WITH DIFFERENTIAL/PLATELET
Abs Immature Granulocytes: 0.17 10*3/uL — ABNORMAL HIGH (ref 0.00–0.07)
Basophils Absolute: 0 10*3/uL (ref 0.0–0.1)
Basophils Relative: 0 %
Eosinophils Absolute: 0 10*3/uL (ref 0.0–0.5)
Eosinophils Relative: 0 %
HCT: 23 % — ABNORMAL LOW (ref 36.0–46.0)
Hemoglobin: 6.8 g/dL — ABNORMAL LOW (ref 12.0–15.0)
Immature Granulocytes: 1 %
Lymphocytes Relative: 3 %
Lymphs Abs: 0.4 10*3/uL — ABNORMAL LOW (ref 0.7–4.0)
MCH: 23.9 pg — ABNORMAL LOW (ref 26.0–34.0)
MCHC: 29.6 g/dL — ABNORMAL LOW (ref 30.0–36.0)
MCV: 81 fL (ref 80.0–100.0)
Monocytes Absolute: 0.9 10*3/uL (ref 0.1–1.0)
Monocytes Relative: 8 %
Neutro Abs: 10.5 10*3/uL — ABNORMAL HIGH (ref 1.7–7.7)
Neutrophils Relative %: 88 %
Platelets: 388 10*3/uL (ref 150–400)
RBC: 2.84 MIL/uL — ABNORMAL LOW (ref 3.87–5.11)
RDW: 17.9 % — ABNORMAL HIGH (ref 11.5–15.5)
WBC: 12 10*3/uL — ABNORMAL HIGH (ref 4.0–10.5)
nRBC: 0.2 % (ref 0.0–0.2)

## 2020-02-25 LAB — TRIGLYCERIDES: Triglycerides: 170 mg/dL — ABNORMAL HIGH (ref <150)

## 2020-02-25 LAB — FERRITIN: Ferritin: 154 ng/mL (ref 11–307)

## 2020-02-25 LAB — PREPARE RBC (CROSSMATCH)

## 2020-02-25 LAB — MAGNESIUM: Magnesium: 2.4 mg/dL (ref 1.7–2.4)

## 2020-02-25 LAB — PHOSPHORUS: Phosphorus: 4.1 mg/dL (ref 2.5–4.6)

## 2020-02-25 LAB — C-REACTIVE PROTEIN: CRP: 14.9 mg/dL — ABNORMAL HIGH (ref <1.0)

## 2020-02-25 LAB — ABO/RH: ABO/RH(D): A POS

## 2020-02-25 LAB — FIBRIN DERIVATIVES D-DIMER (ARMC ONLY): Fibrin derivatives D-dimer (ARMC): 4767.98 ng/mL (FEU) — ABNORMAL HIGH (ref 0.00–499.00)

## 2020-02-25 MED ORDER — LABETALOL HCL 5 MG/ML IV SOLN: 10.0000 mg | INTRAVENOUS | Status: DC | PRN

## 2020-02-25 MED ORDER — SODIUM CHLORIDE 0.9% IV SOLUTION: Freq: Once | INTRAVENOUS | Status: AC

## 2020-02-25 NOTE — Progress Notes (Signed)
GOALS OF CARE DISCUSSION  The Clinical status was relayed to family in detail. Husband and Children  Updated and notified of patients medical condition.  Patient remains unresponsive and will not open eyes to command.       Patient with increased WOB and using accessory muscles to breathe Explained to family course of therapy and the modalities     Patient with Progressive multiorgan failure with a very high probablity of a very minimal chance of meaningful recovery despite all aggressive and optimal medical therapy. Patient is in the Dying  Process associated with Suffering.  Family understands the situation but are not at terms with comfort measuires  They have consented and agreed to DNR status and will remains DNR status   Family are satisfied with Plan of action and management. All questions answered  Additional CC time 32 mins   Summer Holloway Summer Holloway, M.D.  [facility]  Medical Director [facility] Medical Director [facility] Cardio-Pulmonary Department

## 2020-02-25 NOTE — Progress Notes (Signed)
CRITICAL CARE NOTE [name], presented to [facility] via EMS on 15 February 2020. Had been ill for approximately 2 weeks prior to admission. She had a positive COVID-19 home test on 04 February 2020. Patient was noted to be hypoxic in the field and placed on nonrebreather mask and transported to the ED. Still was showing positive Covid test on admission. PCCM consulted for February 2022 due to worsening respiratory failure. Patient required intubation and mechanical ventilation. Transfer to [facility] service.    Events: 02/18/2020-patient remains critically ill on MV wit PRVC, weaned FiO2 from 100 to 80% today. Sedation was light patient up sitting awake we advanced RASS goal to -3 to -4 for patient comfort.  02/19/2020-patient weaned to 70% today, no events today. Met with husband and reviewed hospital course, answered questions. He is happy and thankful for care. 02/20/2020-FiO2 down to 50%, PEEP at 8, still asynchronous when sedation is lightened 2/8 remains on vent 2/9 failed SAT/SBT, increased WOB, PATIENT DNR STATUS Dx of MSSA PNEUMONIA 2/10 severe hypoxia, unable to wean from vent 2/11 severe hypoxia, unable to wean from vent    CC  follow up respiratory failure  SUBJECTIVE Patient remains critically ill Prognosis is guarded  Vent Mode: PRVC FiO2 (%):  [50 %] 50 % Set Rate:  [15 bmp] 15 bmp Vt Set:  [450 mL] 450 mL PEEP:  [5 cmH20] 5 cmH20 Plateau Pressure:  [22 cmH20] 22 cmH20  CBC    Component Value Date/Time   WBC 12.0 (H) 02/25/2020 0421   RBC 2.84 (L) 02/25/2020 0421   HGB 6.8 (L) 02/25/2020 0421   HGB 10.5 (L) 10/19/2019 1138   HCT 23.0 (L) 02/25/2020 0421   HCT 32.7 (L) 10/19/2019 1138   PLT 388 02/25/2020 0421   PLT 395 10/19/2019 1138   MCV 81.0 02/25/2020 0421   MCV 81 10/19/2019 1138   MCH 23.9 (L) 02/25/2020 0421   MCHC 29.6 (L) 02/25/2020 0421   RDW 17.9 (H) 02/25/2020 0421   RDW 15.6 (H) 10/19/2019 1138   LYMPHSABS 0.4  (L) 02/25/2020 0421   LYMPHSABS 0.9 10/19/2019 1138   MONOABS 0.9 02/25/2020 0421   EOSABS 0.0 02/25/2020 0421   EOSABS 0.1 10/19/2019 1138   BASOSABS 0.0 02/25/2020 0421   BASOSABS 0.1 10/19/2019 1138   BMP Latest Ref Rng & Units 02/25/2020 02/24/2020 02/23/2020  Glucose 70 - 99 mg/dL 106(H) 128(H) 114(H)  BUN 8 - 23 mg/dL 32(H) 31(H) 32(H)  Creatinine 0.44 - 1.00 mg/dL 0.47 0.57 0.63  BUN/Creat Ratio 12 - 28 - - -  Sodium 135 - 145 mmol/L 144 144 141  Potassium 3.5 - 5.1 mmol/L 5.1 5.1 4.1  Chloride 98 - 111 mmol/L 95(L) 95(L) 94(L)  CO2 22 - 32 mmol/L 39(H) 38(H) 39(H)  Calcium 8.9 - 10.3 mg/dL 8.3(L) 8.3(L) 7.8(L)    BP (!) 110/58   Pulse 72   Temp 97.7 F (36.5 C)   Resp 14   Ht 5' 7" (1.702 m)   Wt 79.2 kg   SpO2 95%   BMI 27.35 kg/m    I/O last 3 completed shifts: In: 2609.3 [I.V.:1007.9; NG/GT:1410; IV Piggyback:191.4] Out: 2161 [Urine:2160; Stool:1] No intake/output data recorded.  SpO2: 95 % O2 Flow Rate (L/min): 15 L/min FiO2 (%): 50 %  Estimated body mass index is 27.35 kg/m as calculated from the following:   Height as of this encounter: 5' 7" (1.702 m).   Weight as of this encounter: 79.2 kg.  SIGNIFICANT EVENTS  REVIEW OF SYSTEMS  PATIENT IS UNABLE TO PROVIDE COMPLETE REVIEW OF SYSTEMS DUE TO SEVERE CRITICAL ILLNESS       PHYSICAL EXAMINATION: GENERAL:critically ill appearing, +resp distress NECK: Supple.  PULMONARY: +rhonchi, +wheezing CARDIOVASCULAR: S1 and S2.  GASTROINTESTINAL: Soft, nontender, +Positive bowel sounds.  MUSCULOSKELETAL: No swelling, clubbing, or edema.  NEUROLOGIC: obtunded, GCS<8 SKIN:intact,warm,dry     MEDICATIONS: I have reviewed all medications and confirmed regimen as documented   CULTURE RESULTS   Recent Results (from the past 240 hour(s))  SARS CORONAVIRUS 2 (TAT 6-24 HRS)     Status: Abnormal   Collection Time: 03/05/2020  9:57 AM  Result Value Ref Range Status   SARS Coronavirus 2 POSITIVE (A)  NEGATIVE Final    Comment: (NOTE) SARS-CoV-2 target nucleic acids are DETECTED.  The SARS-CoV-2 RNA is generally detectable in upper and lower respiratory specimens during the acute phase of infection. Positive results are indicative of the presence of SARS-CoV-2 RNA. Clinical correlation with patient history and other diagnostic information is  necessary to determine patient infection status. Positive results do not rule out bacterial infection or co-infection with other viruses.  The expected result is Negative.  Fact Sheet for Patients: SugarRoll.be  Fact Sheet for Healthcare Providers: https://www.woods-mathews.com/  This test is not yet approved or cleared by the [place] FDA and  has been authorized for detection and/or diagnosis of SARS-CoV-2 by FDA under an Emergency Use Authorization (EUA). This EUA will remain  in effect (meaning this test can be used) for the duration of the COVID-19 declaration under Section 564(b)(1) of the Act, 21 U. S.C. section 360bbb-3(b)(1), unless the authorization is terminated or revoked sooner.   Performed at [facility], [place] [address], [place], [place] [address]   Blood culture (single)     Status: None   Collection Time: 03/09/2020 10:15 AM   Specimen: BLOOD  Result Value Ref Range Status   Specimen Description BLOOD LEFT AC  Final   Special Requests   Final    BOTTLES DRAWN AEROBIC AND ANAEROBIC Blood Culture results may not be optimal due to an inadequate volume of blood received in culture bottles   Culture   Final    NO GROWTH 5 DAYS Performed at [facility], [address] [place], [place] [address]    Report Status 02/20/2020 FINAL  Final  Culture, blood (routine x 2) Call MD if unable to obtain prior to antibiotics being given     Status: None   Collection Time: 02/17/2020  9:38 PM   Specimen: BLOOD  Result Value Ref Range Status   Specimen Description BLOOD LEFT  ANTECUBITAL  Final   Special Requests   Final    BOTTLES DRAWN AEROBIC AND ANAEROBIC Blood Culture results may not be optimal due to an inadequate volume of blood received in culture bottles   Culture   Final    NO GROWTH 5 DAYS Performed at [facility], [address] [place], [place] [address]    Report Status 02/20/2020 FINAL  Final  Culture, blood (routine x 2) Call MD if unable to obtain prior to antibiotics being given     Status: None   Collection Time: 02/14/2020  9:38 PM   Specimen: BLOOD  Result Value Ref Range Status   Specimen Description BLOOD BLOOD RIGHT FOREARM  Final   Special Requests   Final    BOTTLES DRAWN AEROBIC AND ANAEROBIC Blood Culture adequate volume   Culture   Final  NO GROWTH 5 DAYS Performed at [facility], [address] [place], [place] [address]    Report Status 02/20/2020 FINAL  Final  Culture, respiratory (non-expectorated)     Status: None   Collection Time: 02/18/20  1:49 PM   Specimen: Tracheal Aspirate; Respiratory  Result Value Ref Range Status   Specimen Description   Final    TRACHEAL ASPIRATE Performed at [facility], [address], [place], [place] [address]    Special Requests   Final    NONE Performed at [facility], [address] [place], [place] [address]    Gram Stain   Final    ABUNDANT WBC PRESENT, PREDOMINANTLY PMN RARE GRAM POSITIVE COCCI    Culture   Final    FEW CANDIDA ALBICANS RARE STAPHYLOCOCCUS AUREUS No Pseudomonas species isolated Performed at [facility] [address] [place], [place] [address]    Report Status 02/22/2020 FINAL  Final   Organism ID, Bacteria STAPHYLOCOCCUS AUREUS  Final      Susceptibility   Staphylococcus aureus - MIC*    CIPROFLOXACIN <=0.5 SENSITIVE Sensitive     ERYTHROMYCIN <=0.25 SENSITIVE Sensitive     GENTAMICIN <=0.5 SENSITIVE Sensitive     OXACILLIN 0.5 SENSITIVE Sensitive     TETRACYCLINE <=1 SENSITIVE Sensitive      VANCOMYCIN 1 SENSITIVE Sensitive     TRIMETH/SULFA <=10 SENSITIVE Sensitive     CLINDAMYCIN <=0.25 SENSITIVE Sensitive     RIFAMPIN <=0.5 SENSITIVE Sensitive     Inducible Clindamycin NEGATIVE Sensitive     * RARE STAPHYLOCOCCUS AUREUS          IMAGING    No results found.   Nutrition Status: Nutrition Problem: Inadequate oral intake Etiology: inability to eat (pt sedated and ventilated) Signs/Symptoms: NPO status       Indwelling Urinary Catheter continued, requirement due to   Reason to continue Indwelling Urinary Catheter strict Intake/Output monitoring for hemodynamic instability   Central Line/ continued, requirement due to  Reason to continue [place] of central venous pressure or other hemodynamic parameters and poor IV access   Ventilator continued, requirement due to severe respiratory failure   Ventilator Sedation RASS 0 to -2      ASSESSMENT AND PLAN SYNOPSIS 70 morbidly obese white female with severe and Acute hypoxemic respiratory failure due to COVID-19 pneumoniawith MSSA pneumoniaCOMPLICATED BY PROGRESSIVE MULTIPLE SCLEROSIS failure to wean from vent   Acute hypoxemic respiratory failure due to COVID-19 pneumonia/ARDS Mechanical ventilation via ARDS protocol, target PRVC 6 cc/kg Wean PEEP and FiO2 as able Goal plateau pressure less than 30, driving pressure less than 15 Paralytics if necessary for vent synchrony, gas exchange Deep sedation per PAD protocol Diuresis as tolerated based on Kidney function VAP prevention order set IV STEROIDS Therapy Follow inflammatory markers as needed with CRP Vitamin C, zinc Plan to repeat and check resp cultures as needed   Severe ACUTE Hypoxic and Hypercapnic Respiratory Failure -continue Full MV support -continue Bronchodilator Therapy -Wean Fio2 and PEEP as tolerated -VAP/VENT bundle implementation  ACUTE DIASTOLIC CARDIAC FAILURE-  -oxygen as needed -Lasix as tolerated   Morbid  obesity, possible OSA.   Will certainly impact respiratory mechanics, ventilator weaning Suspect will need to consider additional PEEP    NEUROLOGY Acute toxic metabolic encephalopathy, need for sedation Goal RASS -2 to -3   CARDIAC ICU monitoring  ID -continue IV abx as prescibed -follow up cultures  GI GI PROPHYLAXIS as indicated   DIET-->TF's  as tolerated Constipation protocol as indicated  ENDO - will use ICU hypoglycemic\Hyperglycemia protocol if indicated    ELECTROLYTES -follow labs as needed -replace as needed -pharmacy consultation and following   DVT/GI PRX ordered and assessed TRANSFUSIONS AS NEEDED MONITOR FSBS I Assessed the need for Labs I Assessed the need for Foley I Assessed the need for Central Venous Line Family Discussion when available I Assessed the need for Mobilization I made an Assessment of medications to be adjusted accordingly Safety Risk assessment completed   CASE DISCUSSED IN MULTIDISCIPLINARY ROUNDS WITH ICU TEAM  Critical Care Time devoted to patient care services described in this note is 47  minutes.   Overall, patient is critically ill, prognosis is guarded.  Patient with Multiorgan failure and at high risk for cardiac arrest and death.     David , M.D.  [facility]  Medical Director [facility] Medical Director [facility] Cardio-Pulmonary Department      

## 2020-02-26 ENCOUNTER — Other Ambulatory Visit: Payer: Self-pay

## 2020-02-26 DIAGNOSIS — J9600 Acute respiratory failure, unspecified whether with hypoxia or hypercapnia: Secondary | ICD-10-CM | POA: Diagnosis not present

## 2020-02-26 DIAGNOSIS — U071 COVID-19: Secondary | ICD-10-CM | POA: Diagnosis not present

## 2020-02-26 DIAGNOSIS — J1282 Pneumonia due to coronavirus disease 2019: Secondary | ICD-10-CM | POA: Diagnosis not present

## 2020-02-26 LAB — GLUCOSE, CAPILLARY
Glucose-Capillary: 77 mg/dL (ref 70–99)
Glucose-Capillary: 91 mg/dL (ref 70–99)
Glucose-Capillary: 78 mg/dL (ref 70–99)
Glucose-Capillary: 68 mg/dL — ABNORMAL LOW (ref 70–99)
Glucose-Capillary: 75 mg/dL (ref 70–99)
Glucose-Capillary: 113 mg/dL — ABNORMAL HIGH (ref 70–99)
Glucose-Capillary: 118 mg/dL — ABNORMAL HIGH (ref 70–99)

## 2020-02-26 LAB — CBC WITH DIFFERENTIAL/PLATELET
Abs Immature Granulocytes: 0.17 10*3/uL — ABNORMAL HIGH (ref 0.00–0.07)
Basophils Absolute: 0 10*3/uL (ref 0.0–0.1)
Basophils Relative: 0 %
Eosinophils Absolute: 0 10*3/uL (ref 0.0–0.5)
Eosinophils Relative: 0 %
HCT: 27.2 % — ABNORMAL LOW (ref 36.0–46.0)
Hemoglobin: 8.3 g/dL — ABNORMAL LOW (ref 12.0–15.0)
Immature Granulocytes: 2 %
Lymphocytes Relative: 5 %
Lymphs Abs: 0.5 10*3/uL — ABNORMAL LOW (ref 0.7–4.0)
MCH: 24.9 pg — ABNORMAL LOW (ref 26.0–34.0)
MCHC: 30.5 g/dL (ref 30.0–36.0)
MCV: 81.4 fL (ref 80.0–100.0)
Monocytes Absolute: 0.8 10*3/uL (ref 0.1–1.0)
Monocytes Relative: 8 %
Neutro Abs: 8.7 10*3/uL — ABNORMAL HIGH (ref 1.7–7.7)
Neutrophils Relative %: 85 %
Platelets: 404 10*3/uL — ABNORMAL HIGH (ref 150–400)
RBC: 3.34 MIL/uL — ABNORMAL LOW (ref 3.87–5.11)
RDW: 18.6 % — ABNORMAL HIGH (ref 11.5–15.5)
WBC: 10.2 10*3/uL (ref 4.0–10.5)
nRBC: 0.2 % (ref 0.0–0.2)

## 2020-02-26 LAB — C-REACTIVE PROTEIN: CRP: 9.1 mg/dL — ABNORMAL HIGH (ref <1.0)

## 2020-02-26 LAB — COMPREHENSIVE METABOLIC PANEL
ALT: 20 U/L (ref 0–44)
AST: 37 U/L (ref 15–41)
Albumin: 1.7 g/dL — ABNORMAL LOW (ref 3.5–5.0)
Alkaline Phosphatase: 109 U/L (ref 38–126)
Anion gap: 7 (ref 5–15)
BUN: 29 mg/dL — ABNORMAL HIGH (ref 8–23)
CO2: 39 mmol/L — ABNORMAL HIGH (ref 22–32)
Calcium: 8 mg/dL — ABNORMAL LOW (ref 8.9–10.3)
Chloride: 94 mmol/L — ABNORMAL LOW (ref 98–111)
Creatinine, Ser: 0.43 mg/dL — ABNORMAL LOW (ref 0.44–1.00)
GFR, Estimated: >60 mL/min (ref >60)
Glucose, Bld: 97 mg/dL (ref 70–99)
Potassium: 5 mmol/L (ref 3.5–5.1)
Sodium: 140 mmol/L (ref 135–145)
Total Bilirubin: 0.5 mg/dL (ref 0.3–1.2)
Total Protein: 5.4 g/dL — ABNORMAL LOW (ref 6.5–8.1)

## 2020-02-26 LAB — BPAM RBC
Blood Product Expiration Date: 202203132359
ISSUE DATE / TIME: 202202120822
Unit Type and Rh: 6200

## 2020-02-26 LAB — TYPE AND SCREEN
ABO/RH(D): A POS
Antibody Screen: NEGATIVE
Unit division: 0

## 2020-02-26 LAB — PHOSPHORUS: Phosphorus: 4.4 mg/dL (ref 2.5–4.6)

## 2020-02-26 LAB — FERRITIN: Ferritin: 102 ng/mL (ref 11–307)

## 2020-02-26 LAB — MAGNESIUM: Magnesium: 2.1 mg/dL (ref 1.7–2.4)

## 2020-02-26 LAB — FIBRIN DERIVATIVES D-DIMER (ARMC ONLY): Fibrin derivatives D-dimer (ARMC): 4360.72 ng/mL (FEU) — ABNORMAL HIGH (ref 0.00–499.00)

## 2020-02-26 MED ORDER — DEXTROSE-NACL 5-0.45 % IV SOLN: INTRAVENOUS | Status: DC

## 2020-02-26 MED ORDER — FREE WATER
100.0000 mL | Freq: Four times a day (QID) | Status: DC
  Administered 2020-02-26 – 2020-02-29 (×12): 100 mL

## 2020-02-26 MED ORDER — DEXTROSE 5 % IV SOLN: INTRAVENOUS | Status: DC

## 2020-02-26 NOTE — Progress Notes (Signed)
CRITICAL CARE NOTE [name], presented to [facility] via EMS on 15 February 2020. Had been ill for approximately 2 weeks prior to admission. She had a positive COVID-19 home test on 04 February 2020. Patient was noted to be hypoxic in the field and placed on nonrebreather mask and transported to the ED. Still was showing positive Covid test on admission. PCCM consulted for February 2022 due to worsening respiratory failure. Patient required intubation and mechanical ventilation. Transfer to [facility] service.    Events: 02/18/2020-patient remains critically ill on MV wit PRVC, weaned FiO2 from 100 to 80% today. Sedation was light patient up sitting awake we advanced RASS goal to -3 to -4 for patient comfort.  02/19/2020-patient weaned to 70% today, no events today. Met with husband and reviewed hospital course, answered questions. He is happy and thankful for care. 02/20/2020-FiO2 down to 50%, PEEP at 8, still asynchronous when sedation is lightened 2/8 remains on vent 2/9 failed SAT/SBT, increased WOB, PATIENT DNR STATUS Dx of MSSA PNEUMONIA 2/10 severe hypoxia, unable to wean from vent 2/11 severe hypoxia, unable to wean from vent 2/12 severe resp failure, hypoxia  CC  follow up respiratory failure  SUBJECTIVE Patient remains critically ill Prognosis is guarded  Vent Mode: PRVC FiO2 (%):  [50 %] 50 % Set Rate:  [15 bmp] 15 bmp Vt Set:  [450 mL] 450 mL PEEP:  [5 cmH20] 5 cmH20 Plateau Pressure:  [23 cmH20-25 cmH20] 25 cmH20  CBC    Component Value Date/Time   WBC 10.2 02/26/2020 0346   RBC 3.34 (L) 02/26/2020 0346   HGB 8.3 (L) 02/26/2020 0346   HGB 10.5 (L) 10/19/2019 1138   HCT 27.2 (L) 02/26/2020 0346   HCT 32.7 (L) 10/19/2019 1138   PLT 404 (H) 02/26/2020 0346   PLT 395 10/19/2019 1138   MCV 81.4 02/26/2020 0346   MCV 81 10/19/2019 1138   MCH 24.9 (L) 02/26/2020 0346   MCHC 30.5 02/26/2020 0346   RDW 18.6 (H) 02/26/2020 0346   RDW 15.6 (H)  10/19/2019 1138   LYMPHSABS 0.5 (L) 02/26/2020 0346   LYMPHSABS 0.9 10/19/2019 1138   MONOABS 0.8 02/26/2020 0346   EOSABS 0.0 02/26/2020 0346   EOSABS 0.1 10/19/2019 1138   BASOSABS 0.0 02/26/2020 0346   BASOSABS 0.1 10/19/2019 1138   BMP Latest Ref Rng & Units 02/26/2020 02/25/2020 02/24/2020  Glucose 70 - 99 mg/dL 97 106(H) 128(H)  BUN 8 - 23 mg/dL 29(H) 32(H) 31(H)  Creatinine 0.44 - 1.00 mg/dL 0.43(L) 0.47 0.57  BUN/Creat Ratio 12 - 28 - - -  Sodium 135 - 145 mmol/L 140 144 144  Potassium 3.5 - 5.1 mmol/L 5.0 5.1 5.1  Chloride 98 - 111 mmol/L 94(L) 95(L) 95(L)  CO2 22 - 32 mmol/L 39(H) 39(H) 38(H)  Calcium 8.9 - 10.3 mg/dL 8.0(L) 8.3(L) 8.3(L)    BP 140/73   Pulse 81   Temp 98.24 F (36.8 C)   Resp 12   Ht 5' 7"  (1.702 m)   Wt 79.2 kg   SpO2 95%   BMI 27.35 kg/m    I/O last 3 completed shifts: In: 1966.6 [I.V.:1294.1; Blood:372.5; IV [id number]:381] Out: 2075 [Urine:2075] No intake/output data recorded.  SpO2: 95 % O2 Flow Rate (L/min): 15 L/min FiO2 (%): 50 %  Estimated body mass index is 27.35 kg/m as calculated from the following:   Height as of this encounter: 5' 7"  (1.702 m).   Weight as of this encounter: 79.2 kg.  SIGNIFICANT EVENTS  REVIEW OF SYSTEMS  PATIENT IS UNABLE TO PROVIDE COMPLETE REVIEW OF SYSTEMS DUE TO SEVERE CRITICAL ILLNESS       PHYSICAL EXAMINATION: GENERAL:critically ill appearing, +resp distress NECK: Supple.  PULMONARY: +rhonchi, +wheezing CARDIOVASCULAR: S1 and S2.  GASTROINTESTINAL: Soft, nontender, +Positive bowel sounds.  MUSCULOSKELETAL: No swelling, clubbing, or edema.  NEUROLOGIC: obtunded, GCS<8 SKIN:intact,warm,dry     MEDICATIONS: I have reviewed all medications and confirmed regimen as documented   CULTURE RESULTS   Recent Results (from the past 240 hour(s))  Culture, respiratory (non-expectorated)     Status: None   Collection Time: 02/18/20  1:49 PM   Specimen: Tracheal Aspirate; Respiratory   Result Value Ref Range Status   Specimen Description   Final    TRACHEAL ASPIRATE Performed at [facility], [address] [place], [place] [address]    Special Requests   Final    NONE Performed at [facility], [address] [place], [place] [address]    Gram Stain   Final    ABUNDANT WBC PRESENT, PREDOMINANTLY PMN RARE GRAM POSITIVE COCCI    Culture   Final    FEW CANDIDA ALBICANS RARE STAPHYLOCOCCUS AUREUS No Pseudomonas species isolated Performed at [facility] [address], [place], [place] [address]    Report Status 02/22/2020 FINAL  Final   Organism ID, Bacteria STAPHYLOCOCCUS AUREUS  Final      Susceptibility   Staphylococcus aureus - MIC*    CIPROFLOXACIN <=0.5 SENSITIVE Sensitive     ERYTHROMYCIN <=0.25 SENSITIVE Sensitive     GENTAMICIN <=0.5 SENSITIVE Sensitive     OXACILLIN 0.5 SENSITIVE Sensitive     TETRACYCLINE <=1 SENSITIVE Sensitive     VANCOMYCIN 1 SENSITIVE Sensitive     TRIMETH/SULFA <=10 SENSITIVE Sensitive     CLINDAMYCIN <=0.25 SENSITIVE Sensitive     RIFAMPIN <=0.5 SENSITIVE Sensitive     Inducible Clindamycin NEGATIVE Sensitive     * RARE STAPHYLOCOCCUS AUREUS          IMAGING    No results found.   Nutrition Status: Nutrition Problem: Inadequate oral intake Etiology: inability to eat (pt sedated and ventilated) Signs/Symptoms: NPO status       Indwelling Urinary Catheter continued, requirement due to   Reason to continue Indwelling Urinary Catheter strict Intake/Output monitoring for hemodynamic instability   Central Line/ continued, requirement due to  Reason to continue [place] of central venous pressure or other hemodynamic parameters and poor IV access   Ventilator continued, requirement due to severe respiratory failure   Ventilator Sedation RASS 0 to -2      ASSESSMENT AND PLAN SYNOPSIS  70 morbidly obese white female with severe and Acute hypoxemic respiratory  failure due to COVID-19 pneumoniawith MSSA pneumoniaCOMPLICATED BY PROGRESSIVE MULTIPLE SCLEROSIS failure to wean from vent   Acute hypoxemic respiratory failure due to COVID-19 pneumonia Mechanical ventilation via ARDS protocol, target PRVC 6 cc/kg Wean PEEP and FiO2 as able Goal plateau pressure less than 30, driving pressure less than 15 Paralytics if necessary for vent synchrony, gas exchange Diuresis as tolerated based on Kidney function VAP prevention order set IV STEROIDS Therapy Follow inflammatory markers as needed with CRP Vitamin C, zinc Plan to repeat and check resp cultures as needed   Severe ACUTE Hypoxic and Hypercapnic Respiratory Failure -continue Full MV support -continue Bronchodilator Therapy -Wean Fio2 and PEEP as tolerated -VAP/VENT bundle implementation  ACUTE DIASTOLIC CARDIAC FAILURE-  -oxygen as needed -Lasix as tolerated   Morbid  obesity, possible OSA.   Will certainly impact respiratory mechanics, ventilator weaning Suspect will need to consider additional PEEP     NEUROLOGY PROGRESSIVE MULTIPLE SCLEROSIS VERY POOR PROGNOSIS Acute toxic metabolic encephalopathy, need for sedation Goal RASS -2 to -3  CARDIAC ICU monitoring  ID -continue IV abx as prescibed -follow up cultures  GI GI PROPHYLAXIS as indicated   DIET-->TF's as tolerated Constipation protocol as indicated  ENDO - will use ICU hypoglycemic\Hyperglycemia protocol if indicated    ELECTROLYTES -follow labs as needed -replace as needed -pharmacy consultation and following   DVT/GI PRX ordered and assessed TRANSFUSIONS AS NEEDED MONITOR FSBS I Assessed the need for Labs I Assessed the need for Foley I Assessed the need for Central Venous Line Family Discussion when available I Assessed the need for Mobilization I made an Assessment of medications to be adjusted accordingly Safety Risk assessment completed   CASE DISCUSSED IN MULTIDISCIPLINARY ROUNDS WITH  ICU TEAM  Critical Care Time devoted to patient care services described in this note is 48 minutes.   Overall, patient is critically ill, prognosis is guarded.  Patient with Multiorgan failure and at high risk for cardiac arrest and death.    Corrin [place], M.D.  [facility]  Medical Director [place] Director [facility] Cardio-Pulmonary Department

## 2020-02-26 NOTE — Plan of Care (Addendum)
Pt turned Q2H this shift, isolation DC'd per Dr Belia Heman, husband at bedside, questions answered, understanding verbalized, UOP < this shift, 50% FIO2 on vent, pt able to follow simple commands with her lower extremities when sedation is off, upper extremities seem weaker.  She does not turn her head to track when her name is called,  does exhibit anxiety at times with eyes spontaneously open. Left pupil is smaller than right and does not appear to be responsive to light, may just be very sluggish.  TF continues w/no issues.

## 2020-02-27 DIAGNOSIS — F418 Other specified anxiety disorders: Secondary | ICD-10-CM | POA: Diagnosis not present

## 2020-02-27 DIAGNOSIS — J1282 Pneumonia due to coronavirus disease 2019: Secondary | ICD-10-CM | POA: Diagnosis not present

## 2020-02-27 DIAGNOSIS — J9600 Acute respiratory failure, unspecified whether with hypoxia or hypercapnia: Secondary | ICD-10-CM | POA: Diagnosis not present

## 2020-02-27 DIAGNOSIS — U071 COVID-19: Secondary | ICD-10-CM | POA: Diagnosis not present

## 2020-02-27 LAB — CBC WITH DIFFERENTIAL/PLATELET
Abs Immature Granulocytes: 0.31 10*3/uL — ABNORMAL HIGH (ref 0.00–0.07)
Basophils Absolute: 0 10*3/uL (ref 0.0–0.1)
Basophils Relative: 0 %
Eosinophils Absolute: 0.1 10*3/uL (ref 0.0–0.5)
Eosinophils Relative: 1 %
HCT: 29.3 % — ABNORMAL LOW (ref 36.0–46.0)
Hemoglobin: 9.2 g/dL — ABNORMAL LOW (ref 12.0–15.0)
Immature Granulocytes: 2 %
Lymphocytes Relative: 5 %
Lymphs Abs: 0.7 10*3/uL (ref 0.7–4.0)
MCH: 25.2 pg — ABNORMAL LOW (ref 26.0–34.0)
MCHC: 31.4 g/dL (ref 30.0–36.0)
MCV: 80.3 fL (ref 80.0–100.0)
Monocytes Absolute: 1.1 10*3/uL — ABNORMAL HIGH (ref 0.1–1.0)
Monocytes Relative: 7 %
Neutro Abs: 13 10*3/uL — ABNORMAL HIGH (ref 1.7–7.7)
Neutrophils Relative %: 85 %
Platelets: 463 10*3/uL — ABNORMAL HIGH (ref 150–400)
RBC: 3.65 MIL/uL — ABNORMAL LOW (ref 3.87–5.11)
RDW: 19 % — ABNORMAL HIGH (ref 11.5–15.5)
WBC: 15.2 10*3/uL — ABNORMAL HIGH (ref 4.0–10.5)
nRBC: 0 % (ref 0.0–0.2)

## 2020-02-27 LAB — GLUCOSE, CAPILLARY
Glucose-Capillary: 78 mg/dL (ref 70–99)
Glucose-Capillary: 56 mg/dL — ABNORMAL LOW (ref 70–99)
Glucose-Capillary: 65 mg/dL — ABNORMAL LOW (ref 70–99)
Glucose-Capillary: 59 mg/dL — ABNORMAL LOW (ref 70–99)
Glucose-Capillary: 89 mg/dL (ref 70–99)
Glucose-Capillary: 119 mg/dL — ABNORMAL HIGH (ref 70–99)

## 2020-02-27 LAB — BASIC METABOLIC PANEL
Anion gap: 10 (ref 5–15)
BUN: 23 mg/dL (ref 8–23)
CO2: 36 mmol/L — ABNORMAL HIGH (ref 22–32)
Calcium: 8 mg/dL — ABNORMAL LOW (ref 8.9–10.3)
Chloride: 91 mmol/L — ABNORMAL LOW (ref 98–111)
Creatinine, Ser: 0.55 mg/dL (ref 0.44–1.00)
GFR, Estimated: >60 mL/min (ref >60)
Glucose, Bld: 96 mg/dL (ref 70–99)
Potassium: 4.5 mmol/L (ref 3.5–5.1)
Sodium: 137 mmol/L (ref 135–145)

## 2020-02-27 LAB — MAGNESIUM: Magnesium: 2.3 mg/dL (ref 1.7–2.4)

## 2020-02-27 LAB — FERRITIN: Ferritin: 105 ng/mL (ref 11–307)

## 2020-02-27 LAB — C-REACTIVE PROTEIN: CRP: 10.6 mg/dL — ABNORMAL HIGH (ref <1.0)

## 2020-02-27 LAB — PHOSPHORUS: Phosphorus: 4.6 mg/dL (ref 2.5–4.6)

## 2020-02-27 LAB — FIBRIN DERIVATIVES D-DIMER (ARMC ONLY): Fibrin derivatives D-dimer (ARMC): 4865.87 ng/mL (FEU) — ABNORMAL HIGH (ref 0.00–499.00)

## 2020-02-27 MED ORDER — ENOXAPARIN SODIUM 40 MG/0.4ML ~~LOC~~ SOLN
40.0000 mg | SUBCUTANEOUS | Status: DC
  Administered 2020-02-27 – 2020-02-28 (×2): 40 mg via SUBCUTANEOUS
  Filled 2020-02-27 (×2): qty 0.4

## 2020-02-27 MED ORDER — DEXTROSE 10 % IV SOLN: INTRAVENOUS | Status: DC

## 2020-02-27 MED ORDER — VITAL 1.5 CAL PO LIQD
1000.0000 mL | ORAL | Status: DC
  Administered 2020-02-27: 1000 mL

## 2020-02-27 MED ORDER — CEFAZOLIN SODIUM-DEXTROSE 2-4 GM/100ML-% IV SOLN
2.0000 g | Freq: Three times a day (TID) | INTRAVENOUS | Status: AC
  Administered 2020-02-27 – 2020-02-29 (×6): 2 g via INTRAVENOUS
  Filled 2020-02-27 (×6): qty 100

## 2020-02-27 MED ORDER — PROSOURCE TF PO LIQD
45.0000 mL | Freq: Three times a day (TID) | ORAL | Status: DC
  Administered 2020-02-27 – 2020-02-28 (×5): 45 mL
  Filled 2020-02-27: qty 45

## 2020-02-27 NOTE — Progress Notes (Signed)
CRITICAL CARE NOTE [name], presented to [facility] via EMS on 15 February 2020. Had been ill for approximately 2 weeks prior to admission. She had a positive COVID-19 home test on 04 February 2020. Patient was noted to be hypoxic in the field and placed on nonrebreather mask and transported to the ED. Still was showing positive Covid test on admission. PCCM consulted for February 2022 due to worsening respiratory failure. Patient required intubation and mechanical ventilation. Transfer to [facility] service.    Events: 02/18/2020-patient remains critically ill on MV wit PRVC, weaned FiO2 from 100 to 80% today. Sedation was light patient up sitting awake we advanced RASS goal to -3 to -4 for patient comfort.  02/19/2020-patient weaned to 70% today, no events today. Met with husband and reviewed hospital course, answered questions. He is happy and thankful for care. 02/20/2020-FiO2 down to 50%, PEEP at 8, still asynchronous when sedation is lightened 2/8 remains on vent 2/9 failed SAT/SBT, increased WOB, PATIENT DNR STATUS Dx of MSSA PNEUMONIA 2/10 severe hypoxia, unable to wean from vent 2/11 severe hypoxia, unable to wean from vent 2/12 severe resp failure, hypoxia 2/13 severe resp failure, husband at bedside updated       CC  follow up respiratory failure  SUBJECTIVE Patient remains critically ill Prognosis is guarded  Vent Mode: PRVC FiO2 (%):  [50 %] 50 % Set Rate:  [15 bmp] 15 bmp Vt Set:  [450 mL] 450 mL PEEP:  [5 cmH20] 5 cmH20 Plateau Pressure:  [17 cmH20-26 cmH20] 19 cmH20 CBC    Component Value Date/Time   WBC 15.2 (H) 02/27/2020 0500   RBC 3.65 (L) 02/27/2020 0500   HGB 9.2 (L) 02/27/2020 0500   HGB 10.5 (L) 10/19/2019 1138   HCT 29.3 (L) 02/27/2020 0500   HCT 32.7 (L) 10/19/2019 1138   PLT 463 (H) 02/27/2020 0500   PLT 395 10/19/2019 1138   MCV 80.3 02/27/2020 0500   MCV 81 10/19/2019 1138   MCH 25.2 (L) 02/27/2020 0500   MCHC  31.4 02/27/2020 0500   RDW 19.0 (H) 02/27/2020 0500   RDW 15.6 (H) 10/19/2019 1138   LYMPHSABS 0.7 02/27/2020 0500   LYMPHSABS 0.9 10/19/2019 1138   MONOABS 1.1 (H) 02/27/2020 0500   EOSABS 0.1 02/27/2020 0500   EOSABS 0.1 10/19/2019 1138   BASOSABS 0.0 02/27/2020 0500   BASOSABS 0.1 10/19/2019 1138   BMP Latest Ref Rng & Units 02/27/2020 02/26/2020 02/25/2020  Glucose 70 - 99 mg/dL 96 97 106(H)  BUN 8 - 23 mg/dL 23 29(H) 32(H)  Creatinine 0.44 - 1.00 mg/dL 0.55 0.43(L) 0.47  BUN/Creat Ratio 12 - 28 - - -  Sodium 135 - 145 mmol/L 137 140 144  Potassium 3.5 - 5.1 mmol/L 4.5 5.0 5.1  Chloride 98 - 111 mmol/L 91(L) 94(L) 95(L)  CO2 22 - 32 mmol/L 36(H) 39(H) 39(H)  Calcium 8.9 - 10.3 mg/dL 8.0(L) 8.0(L) 8.3(L)    BP (!) 143/79   Pulse 73   Temp 98.06 F (36.7 C)   Resp 13   Ht $R'5\' 7"'ad$  (1.702 m)   Wt 79.2 kg   SpO2 93%   BMI 27.35 kg/m    I/O last 3 completed shifts: In: 4010.9 [I.V.:940.9; NG/GT:2920; IV Piggyback:150] Out: 2014 [Urine:2014] No intake/output data recorded.  SpO2: 93 % O2 Flow Rate (L/min): 15 L/min FiO2 (%): 50 %  Estimated body mass index is 27.35 kg/m as calculated from the following:   Height as of this encounter: $RemoveBeforeD'5\' 7"'vlSXYCFlJYemep$  (  1.702 m).   Weight as of this encounter: 79.2 kg.  SIGNIFICANT EVENTS   REVIEW OF SYSTEMS  PATIENT IS UNABLE TO PROVIDE COMPLETE REVIEW OF SYSTEMS DUE TO SEVERE CRITICAL ILLNESS        PHYSICAL EXAMINATION:  GENERAL:critically ill appearing, +resp distress EYES: Pupils equal, round, reactive to light.  No scleral icterus.  MOUTH: Moist mucosal membrane. NECK: Supple.  PULMONARY: +rhonchi, +wheezing CARDIOVASCULAR: S1 and S2. Regular rate and rhythm. No murmurs, rubs, or gallops.  GASTROINTESTINAL: Soft, nontender, -distended.  Positive bowel sounds.   MUSCULOSKELETAL: No swelling, clubbing, or edema.  NEUROLOGIC: obtunded, GCS<8 SKIN:intact,warm,dry  MEDICATIONS: I have reviewed all medications and confirmed regimen  as documented   CULTURE RESULTS   Recent Results (from the past 240 hour(s))  Culture, respiratory (non-expectorated)     Status: None   Collection Time: 02/18/20  1:49 PM   Specimen: Tracheal Aspirate; Respiratory  Result Value Ref Range Status   Specimen Description   Final    TRACHEAL ASPIRATE Performed at [facility], [address] [place], [place] [address]    Special Requests   Final    NONE Performed at [facility], [address] [place], [place] [address]    Gram Stain   Final    ABUNDANT WBC PRESENT, PREDOMINANTLY PMN RARE GRAM POSITIVE COCCI    Culture   Final    FEW CANDIDA ALBICANS RARE STAPHYLOCOCCUS AUREUS No Pseudomonas species isolated Performed at [facility] [address], [place], [place] [address]    Report Status 02/22/2020 FINAL  Final   Organism ID, Bacteria STAPHYLOCOCCUS AUREUS  Final      Susceptibility   Staphylococcus aureus - MIC*    CIPROFLOXACIN <=0.5 SENSITIVE Sensitive     ERYTHROMYCIN <=0.25 SENSITIVE Sensitive     GENTAMICIN <=0.5 SENSITIVE Sensitive     OXACILLIN 0.5 SENSITIVE Sensitive     TETRACYCLINE <=1 SENSITIVE Sensitive     VANCOMYCIN 1 SENSITIVE Sensitive     TRIMETH/SULFA <=10 SENSITIVE Sensitive     CLINDAMYCIN <=0.25 SENSITIVE Sensitive     RIFAMPIN <=0.5 SENSITIVE Sensitive     Inducible Clindamycin NEGATIVE Sensitive     * RARE STAPHYLOCOCCUS AUREUS          IMAGING    No results found.   Nutrition Status: Nutrition Problem: Inadequate oral intake Etiology: inability to eat (pt sedated and ventilated) Signs/Symptoms: NPO status       Indwelling Urinary Catheter continued, requirement due to   Reason to continue Indwelling Urinary Catheter strict Intake/Output monitoring for hemodynamic instability   Central Line/ continued, requirement due to  Reason to continue Central of central venous pressure or other hemodynamic parameters and poor IV access    Ventilator continued, requirement due to severe respiratory failure   Ventilator Sedation RASS 0 to -2      ASSESSMENT AND PLAN SYNOPSIS  70 morbidly obese white female with severe and Acute hypoxemic respiratory failure due to COVID-19 pneumoniawith MSSA pneumoniaCOMPLICATED BY PROGRESSIVE MULTIPLE SCLEROSISfailure to wean from vent   Severe ACUTE Hypoxic and Hypercapnic Respiratory Failure -continue Full MV support -Wean Fio2 and PEEP as tolerated -will perform SAT/SBT when respiratory parameters are met -VAP/VENT bundle implementation  ACUTE DIASTOLIC CARDIAC FAILURE-  -oxygen as needed -Lasix as tolerated   Morbid obesity, possible OSA.   Will certainly impact respiratory mechanics, ventilator weaning Suspect will need to consider additional PEEP    NEUROLOGY Acute toxic metabolic encephalopathy, need for sedation  Goal RASS -2 to -3 Wake up assessment pending when husband arrives PROGRESSIVE MULTIPLE SCLEROSIS VERY POOR PROGNOSIS    CARDIAC ICU monitoring  ID -continue IV abx as prescibed -follow up cultures  GI GI PROPHYLAXIS as indicated   DIET-->TF's as tolerated Constipation protocol as indicated  ENDO - will use ICU hypoglycemic\Hyperglycemia protocol if indicated     ELECTROLYTES -follow labs as needed -replace as needed -pharmacy consultation and following   DVT/GI PRX ordered and assessed TRANSFUSIONS AS NEEDED MONITOR FSBS I Assessed the need for Labs I Assessed the need for Foley I Assessed the need for Central Venous Line Family Discussion when available I Assessed the need for Mobilization I made an Assessment of medications to be adjusted accordingly Safety Risk assessment completed   CASE DISCUSSED IN MULTIDISCIPLINARY ROUNDS WITH ICU TEAM  Critical Care Time devoted to patient care services described in this note is 47  minutes.   Overall, patient is critically ill, prognosis is guarded.  Patient with Multiorgan  failure and at high risk for cardiac arrest and death.    Corrin [place], M.D.  [facility]  Medical Director [place] Director [facility] Cardio-Pulmonary Department

## 2020-02-27 NOTE — Progress Notes (Signed)
GOALS OF CARE DISCUSSION  The Clinical status was relayed to family in detail. Husband at bedside  Updated and notified of patients medical condition.  Patient remains unresponsive and will not open eyes to command.    Patient is having a weak cough and struggling to remove secretions.   Patient with increased WOB and using accessory muscles to breathe Explained to family course of therapy and the modalities     Patient with Progressive multiorgan failure with a very high probablity of a very minimal chance of meaningful recovery despite all aggressive and optimal medical therapy.   Process associated with Suffering.  Family understands the situation.  They have consented and agreed to DNR/DNI and remains DNR status  Family are satisfied with Plan of action and management. All questions answered  Additional CC time 32 mins   Ariyon Mittleman Santiago [place], M.D.  [facility]  Medical Director [facility] Medical Director [facility] Cardio-Pulmonary Department

## 2020-02-27 NOTE — Progress Notes (Signed)
Nutrition Follow-up  DOCUMENTATION CODES:   Not applicable  INTERVENTION:  Initiate new goal TF regimen of Vital 1.5 Cal at 55 mL/hr (1320 mL goal daily volume) + PROSource TF 45 mL TID per tube. Provides 2100 kcal, 122 grams of protein, 1003 mL H2O daily. With current propofol rate provides 2549 kcal.  NUTRITION DIAGNOSIS:   Inadequate oral intake related to inability to eat (pt sedated and ventilated) as evidenced by NPO status.  Ongoing.  GOAL:   Provide needs based on ASPEN/SCCM guidelines  Met with TF regimen.  MONITOR:   Vent status,Labs,Weight trends,Skin,I & O's,TF tolerance  REASON FOR ASSESSMENT:   Consult Enteral/tube feeding initiation and management  ASSESSMENT:   70 y/o female with h/o MS, HTN, HLD, hypothroidism, depression/anxiety and GERD who is admitted with COVID 19  2/4 intubated  Patient is currently intubated on ventilator support MV: 8.2 L/min Temp (24hrs), Avg:98.4 F (36.9 C), Min:97.34 F (36.3 C), Max:99.68 F (37.6 C)  Propofol: 17 ml/hr (449 kcal daily)  Medications reviewed and include: Vitamin C 500 mg daily per tube, Decadron 6 mg Q24hrs IV, Colace 100 mg BID, free water 100 mL Q6hrs per tube, lactulose 10 grams BID per tube, levothyroxine, Protonix, Miralax, zinc sulfate 220 mg daily per tube, cefazolin, fentanyl gtt, propofol gtt, D5-1/2NS at 50 mL/hr.  Labs reviewed: CBG 56-59, Chloride 91, CO2 36.  I/O: 1664 mL UOP yesterday (0.9 mL/kg/hr)  Weight trend: 79.2 kg on 2/13; -1.6 kg from 2/4  Enteral Access: OGT placed 2/4; terminates in stomach per abdominal x-ray 2/4  TF regimen: Vital AF 1.2 Cal at 55 mL/hr + PROSource TF 45 mL daily per tube  Discussed on rounds. Patient tolerating tube feeds well. Experiencing hypoglycemia requiring D5-1/2NS.   Diet Order:   Diet Order            Diet NPO time specified  Diet effective now                EDUCATION NEEDS:   No education needs have been identified at this  time  Skin:  Skin Assessment: Reviewed RN Assessment  Last BM:  02/25/2020 per chart  Height:   Ht Readings from Last 1 Encounters:  02/17/20 5' 7"  (1.702 m)   Weight:   Wt Readings from Last 1 Encounters:  02/26/20 79.2 kg   Ideal Body Weight:  61.36 kg  BMI:  Body mass index is 27.35 kg/m.  Estimated Nutritional Needs:   Kcal:  2059  Protein:  120-130 grams  Fluid:  >/= 2 L/day  Jacklynn Barnacle, MS, RD, LDN Pager number available on Amion

## 2020-02-28 ENCOUNTER — Inpatient Hospital Stay: Payer: Medicare PPO

## 2020-02-28 DIAGNOSIS — J1282 Pneumonia due to coronavirus disease 2019: Secondary | ICD-10-CM | POA: Diagnosis not present

## 2020-02-28 DIAGNOSIS — U071 COVID-19: Secondary | ICD-10-CM | POA: Diagnosis not present

## 2020-02-28 DIAGNOSIS — J9600 Acute respiratory failure, unspecified whether with hypoxia or hypercapnia: Secondary | ICD-10-CM | POA: Diagnosis not present

## 2020-02-28 LAB — RENAL FUNCTION PANEL
Albumin: 1.7 g/dL — ABNORMAL LOW (ref 3.5–5.0)
Anion gap: 9 (ref 5–15)
BUN: 35 mg/dL — ABNORMAL HIGH (ref 8–23)
CO2: 34 mmol/L — ABNORMAL HIGH (ref 22–32)
Calcium: 8 mg/dL — ABNORMAL LOW (ref 8.9–10.3)
Chloride: 94 mmol/L — ABNORMAL LOW (ref 98–111)
Creatinine, Ser: 1.16 mg/dL — ABNORMAL HIGH (ref 0.44–1.00)
GFR, Estimated: 51 mL/min — ABNORMAL LOW (ref >60)
Glucose, Bld: 141 mg/dL — ABNORMAL HIGH (ref 70–99)
Phosphorus: 6.6 mg/dL — ABNORMAL HIGH (ref 2.5–4.6)
Potassium: 4.7 mmol/L (ref 3.5–5.1)
Sodium: 137 mmol/L (ref 135–145)

## 2020-02-28 LAB — CBC WITH DIFFERENTIAL/PLATELET
Abs Immature Granulocytes: 0.39 10*3/uL — ABNORMAL HIGH (ref 0.00–0.07)
Basophils Absolute: 0 10*3/uL (ref 0.0–0.1)
Basophils Relative: 0 %
Eosinophils Absolute: 0.1 10*3/uL (ref 0.0–0.5)
Eosinophils Relative: 0 %
HCT: 27.6 % — ABNORMAL LOW (ref 36.0–46.0)
Hemoglobin: 8.6 g/dL — ABNORMAL LOW (ref 12.0–15.0)
Immature Granulocytes: 3 %
Lymphocytes Relative: 4 %
Lymphs Abs: 0.5 10*3/uL — ABNORMAL LOW (ref 0.7–4.0)
MCH: 25.4 pg — ABNORMAL LOW (ref 26.0–34.0)
MCHC: 31.2 g/dL (ref 30.0–36.0)
MCV: 81.7 fL (ref 80.0–100.0)
Monocytes Absolute: 0.5 10*3/uL (ref 0.1–1.0)
Monocytes Relative: 4 %
Neutro Abs: 12.8 10*3/uL — ABNORMAL HIGH (ref 1.7–7.7)
Neutrophils Relative %: 89 %
Platelets: 498 10*3/uL — ABNORMAL HIGH (ref 150–400)
RBC: 3.38 MIL/uL — ABNORMAL LOW (ref 3.87–5.11)
RDW: 19.7 % — ABNORMAL HIGH (ref 11.5–15.5)
WBC: 14.3 10*3/uL — ABNORMAL HIGH (ref 4.0–10.5)
nRBC: 0 % (ref 0.0–0.2)

## 2020-02-28 LAB — TRIGLYCERIDES: Triglycerides: 130 mg/dL (ref <150)

## 2020-02-28 LAB — MAGNESIUM: Magnesium: 2.4 mg/dL (ref 1.7–2.4)

## 2020-02-28 LAB — CK: Total CK: 11 U/L — ABNORMAL LOW (ref 38–234)

## 2020-02-28 LAB — C-REACTIVE PROTEIN: CRP: 15.7 mg/dL — ABNORMAL HIGH (ref <1.0)

## 2020-02-28 LAB — FIBRIN DERIVATIVES D-DIMER (ARMC ONLY): Fibrin derivatives D-dimer (ARMC): 4845.67 ng/mL (FEU) — ABNORMAL HIGH (ref 0.00–499.00)

## 2020-02-28 LAB — GLUCOSE, CAPILLARY
Glucose-Capillary: 112 mg/dL — ABNORMAL HIGH (ref 70–99)
Glucose-Capillary: 114 mg/dL — ABNORMAL HIGH (ref 70–99)
Glucose-Capillary: 126 mg/dL — ABNORMAL HIGH (ref 70–99)
Glucose-Capillary: 135 mg/dL — ABNORMAL HIGH (ref 70–99)
Glucose-Capillary: 151 mg/dL — ABNORMAL HIGH (ref 70–99)
Glucose-Capillary: 164 mg/dL — ABNORMAL HIGH (ref 70–99)

## 2020-02-28 LAB — PHOSPHORUS: Phosphorus: 6.5 mg/dL — ABNORMAL HIGH (ref 2.5–4.6)

## 2020-02-28 LAB — FERRITIN: Ferritin: 115 ng/mL (ref 11–307)

## 2020-02-28 MED ORDER — MIDAZOLAM 50MG/50ML (1MG/ML) PREMIX INFUSION
0.5000 mg/h | INTRAVENOUS | Status: DC
  Administered 2020-02-28: 3 mg/h via INTRAVENOUS
  Administered 2020-02-28: 2 mg/h via INTRAVENOUS
  Administered 2020-02-29: 3 mg/h via INTRAVENOUS
  Filled 2020-02-28 (×3): qty 50

## 2020-02-28 NOTE — Progress Notes (Signed)
CRITICAL CARE NOTE [name], presented to [facility] via EMS on 15 February 2020. Had been ill for approximately 2 weeks prior to admission. She had a positive COVID-19 home test on 04 February 2020. Patient was noted to be hypoxic in the field and placed on nonrebreather mask and transported to the ED. Still was showing positive Covid test on admission. PCCM consulted for February 2022 due to worsening respiratory failure. Patient required intubation and mechanical ventilation. Transfer to [facility] service.    Events: 02/18/2020-patient remains critically ill on MV wit PRVC, weaned FiO2 from 100 to 80% today. Sedation was light patient up sitting awake we advanced RASS goal to -3 to -4 for patient comfort.  02/19/2020-patient weaned to 70% today, no events today. Met with husband and reviewed hospital course, answered questions. He is happy and thankful for care. 02/20/2020-FiO2 down to 50%, PEEP at 8, still asynchronous when sedation is lightened 2/8 remains on vent 2/9 failed SAT/SBT, increased WOB, PATIENT DNR STATUS Dx of MSSA PNEUMONIA 2/10 severe hypoxia, unable to wean from vent 2/11 severe hypoxia, unable to wean from vent 2/12 severe resp failure, hypoxia 2/13 severe resp failure, husband at bedside updated 2/14 failed weaning trials  CC  follow up respiratory failure  SUBJECTIVE Patient remains critically ill Prognosis is guarded  Vent Mode: PRVC FiO2 (%):  [40 %-50 %] 50 % Set Rate:  [15 bmp] 15 bmp Vt Set:  [450 mL] 450 mL PEEP:  [5 cmH20] 5 cmH20 Plateau Pressure:  [18 cmH20-30 cmH20] 30 cmH20  CBC    Component Value Date/Time   WBC 14.3 (H) 02/28/2020 0440   RBC 3.38 (L) 02/28/2020 0440   HGB 8.6 (L) 02/28/2020 0440   HGB 10.5 (L) 10/19/2019 1138   HCT 27.6 (L) 02/28/2020 0440   HCT 32.7 (L) 10/19/2019 1138   PLT 498 (H) 02/28/2020 0440   PLT 395 10/19/2019 1138   MCV 81.7 02/28/2020 0440   MCV 81 10/19/2019 1138   MCH 25.4 (L)  02/28/2020 0440   MCHC 31.2 02/28/2020 0440   RDW 19.7 (H) 02/28/2020 0440   RDW 15.6 (H) 10/19/2019 1138   LYMPHSABS 0.5 (L) 02/28/2020 0440   LYMPHSABS 0.9 10/19/2019 1138   MONOABS 0.5 02/28/2020 0440   EOSABS 0.1 02/28/2020 0440   EOSABS 0.1 10/19/2019 1138   BASOSABS 0.0 02/28/2020 0440   BASOSABS 0.1 10/19/2019 1138   BMP Latest Ref Rng & Units 02/28/2020 02/27/2020 02/26/2020  Glucose 70 - 99 mg/dL 141(H) 96 97  BUN 8 - 23 mg/dL 35(H) 23 29(H)  Creatinine 0.44 - 1.00 mg/dL 1.16(H) 0.55 0.43(L)  BUN/Creat Ratio 12 - 28 - - -  Sodium 135 - 145 mmol/L 137 137 140  Potassium 3.5 - 5.1 mmol/L 4.7 4.5 5.0  Chloride 98 - 111 mmol/L 94(L) 91(L) 94(L)  CO2 22 - 32 mmol/L 34(H) 36(H) 39(H)  Calcium 8.9 - 10.3 mg/dL 8.0(L) 8.0(L) 8.0(L)    BP 110/61   Pulse 79   Temp (!) 97.34 F (36.3 C)   Resp 12   Ht 5' 7"  (1.702 m)   Wt 79.2 kg   SpO2 94%   BMI 27.35 kg/m    I/O last 3 completed shifts: In: 0300 [I.V.:2005.2; NG/GT:1876.9; IV Piggyback:199.9] Out: 3135 [Urine:3115; Emesis/NG output:20] No intake/output data recorded.  SpO2: 94 % O2 Flow Rate (L/min): 15 L/min FiO2 (%): 50 %  Estimated body mass index is 27.35 kg/m as calculated from the following:   Height as of this  encounter: 5' 7"  (1.702 m).   Weight as of this encounter: 79.2 kg.  SIGNIFICANT EVENTS   REVIEW OF SYSTEMS  PATIENT IS UNABLE TO PROVIDE COMPLETE REVIEW OF SYSTEMS DUE TO SEVERE CRITICAL ILLNESS       PHYSICAL EXAMINATION: GENERAL:critically ill appearing, +resp distress NECK: Supple.  PULMONARY: +rhonchi, +wheezing CARDIOVASCULAR: S1 and S2.  GASTROINTESTINAL: Soft, nontender, +Positive bowel sounds.  MUSCULOSKELETAL: No swelling, clubbing, or edema.  NEUROLOGIC: obtunded, GCS<8 SKIN:intact,warm,dry     MEDICATIONS: I have reviewed all medications and confirmed regimen as documented   CULTURE RESULTS   Recent Results (from the past 240 hour(s))  Culture, respiratory  (non-expectorated)     Status: None   Collection Time: 02/18/20  1:49 PM   Specimen: Tracheal Aspirate; Respiratory  Result Value Ref Range Status   Specimen Description   Final    TRACHEAL ASPIRATE Performed at [facility], [address], [place], [place] [address]    Special Requests   Final    NONE Performed at [facility], [address] [place], [place] [address]    Gram Stain   Final    ABUNDANT WBC PRESENT, PREDOMINANTLY PMN RARE GRAM POSITIVE COCCI    Culture   Final    FEW CANDIDA ALBICANS RARE STAPHYLOCOCCUS AUREUS No Pseudomonas species isolated Performed at [facility] [address], [place], [place] [address]    Report Status 02/22/2020 FINAL  Final   Organism ID, Bacteria STAPHYLOCOCCUS AUREUS  Final      Susceptibility   Staphylococcus aureus - MIC*    CIPROFLOXACIN <=0.5 SENSITIVE Sensitive     ERYTHROMYCIN <=0.25 SENSITIVE Sensitive     GENTAMICIN <=0.5 SENSITIVE Sensitive     OXACILLIN 0.5 SENSITIVE Sensitive     TETRACYCLINE <=1 SENSITIVE Sensitive     VANCOMYCIN 1 SENSITIVE Sensitive     TRIMETH/SULFA <=10 SENSITIVE Sensitive     CLINDAMYCIN <=0.25 SENSITIVE Sensitive     RIFAMPIN <=0.5 SENSITIVE Sensitive     Inducible Clindamycin NEGATIVE Sensitive     * RARE STAPHYLOCOCCUS AUREUS          IMAGING    DG Chest Port 1 View  Result Date: 02/28/2020 CLINICAL DATA:  Acute respiratory failure.  Hypoxia. EXAM: PORTABLE CHEST 1 VIEW COMPARISON:  02/21/2020. FINDINGS: Endotracheal tube and NG tube in stable position. Right IJ line noted with tip over right atrium in unchanged position. Heart size stable. Diffuse severe bilateral interstitial infiltrates again noted without interim change. No pleural effusion or pneumothorax. IMPRESSION: 1. Endotracheal tube and NG tube in stable position. Right IJ line noted with tip over the right atrium in unchanged position. 2. Diffuse severe bilateral interstitial infiltrates again noted  without interim change. Electronically Signed   By: Marcello Moores  Register   On: 02/28/2020 06:10     Nutrition Status: Nutrition Problem: Inadequate oral intake Etiology: inability to eat (pt sedated and ventilated) Signs/Symptoms: NPO status       Indwelling Urinary Catheter continued, requirement due to   Reason to continue Indwelling Urinary Catheter strict Intake/Output monitoring for hemodynamic instability   Central Line/ continued, requirement due to  Reason to continue [place] of central venous pressure or other hemodynamic parameters and poor IV access   Ventilator continued, requirement due to severe respiratory failure   Ventilator Sedation RASS 0 to -2      ASSESSMENT AND PLAN SYNOPSIS   70 morbidly obese white female with severe and Acute hypoxemic respiratory failure due to  COVID-19 pneumoniawith MSSA pneumoniaCOMPLICATED BY PROGRESSIVE MULTIPLE SCLEROSISfailure to wean from vent    Acute hypoxemic respiratory failure due to COVID-19 pneumonia/ARDS Mechanical ventilation via ARDS protocol, target PRVC 6 cc/kg Wean PEEP and FiO2 as able Goal plateau pressure less than 30, driving pressure less than 15 Deep sedation per PAD protocol Diuresis as tolerated based on Kidney function VAP prevention order set IV STEROIDS Therapy Follow inflammatory markers as needed with CRP Vitamin C, zinc Plan to repeat and check resp cultures as needed   Severe ACUTE Hypoxic and Hypercapnic Respiratory Failure -continue Full MV support -continue Bronchodilator Therapy -Wean Fio2 and PEEP as tolerated -will perform SAT/SBT when respiratory parameters are met -VAP/VENT bundle implementation  ACUTE DIASTOLIC CARDIAC FAILURE-  -oxygen as needed -Lasix as tolerated   Morbid obesity, possible OSA.   Will certainly impact respiratory mechanics, ventilator weaning Suspect will need to consider additional PEEP   ACUTE KIDNEY INJURY/Renal  Failure -continue Foley Catheter-assess need -Avoid nephrotoxic agents -Follow urine output, BMP -Ensure adequate renal perfusion, optimize oxygenation -Renal dose medications     NEUROLOGY Acute toxic metabolic encephalopathy, need for sedation Goal RASS -2 to -3  CARDIAC ICU monitoring   GI GI PROPHYLAXIS as indicated   DIET-->TF's as tolerated Constipation protocol as indicated  ENDO - will use ICU hypoglycemic\Hyperglycemia protocol if indicated    ELECTROLYTES -follow labs as needed -replace as needed -pharmacy consultation and following   DVT/GI PRX ordered and assessed TRANSFUSIONS AS NEEDED MONITOR FSBS I Assessed the need for Labs I Assessed the need for Foley I Assessed the need for Central Venous Line Family Discussion when available I Assessed the need for Mobilization I made an Assessment of medications to be adjusted accordingly Safety Risk assessment completed   CASE DISCUSSED IN MULTIDISCIPLINARY ROUNDS WITH ICU TEAM  Critical Care Time devoted to patient care services described in this note is 48 minutes.   Overall, patient is critically ill, prognosis is guarded.  Patient with Multiorgan failure and at high risk for cardiac arrest and death.    Corrin Parker, M.D.  [facility]  Medical Director [place] Director [facility] Cardio-Pulmonary Department

## 2020-02-28 NOTE — Progress Notes (Signed)
Pt's family at bedside, sedation turned off when they stated they were ready.  W/I 30 minutes pt awake, eyes very wide, only following simple motor commands w/LEs, not tracking, tachycardic, tachypneic, O2 sats <88%. Pt appeared very uncomfortable but we did attempt to briefly turn FIO2 from 45% to 35%.  Pt did not tolerate, moving her arms non purposefully, raising her shoulders as if in respiratory distress, seemed to be panicked.  Family requested sedation be restarted.  Pt resting comfortably at this time

## 2020-02-28 NOTE — Progress Notes (Signed)
Pt remains on vent overnight. Pt remains on fent and prop gtt's. Foley in place, putting out adequate urine. Turn q2. Pt in no acute distress @ this time. Will continue to monitor.

## 2020-02-28 NOTE — Progress Notes (Signed)
GOALS OF CARE DISCUSSION  Pt's family at bedside, sedation turned off when they stated they were ready.  W/I 30 minutes pt awake, eyes very wide, only following simple motor commands w/LEs, not tracking, tachycardic, tachypneic, O2 sats <88%. Pt appeared very uncomfortable but we did attempt to briefly turn FIO2 from 45% to 35%.  Pt did not tolerate, moving her arms non purposefully, raising her shoulders as if in respiratory distress, seemed to be panicked.  Family requested sedation be restarted.  Pt resting comfortably at this time     The Clinical status was relayed to family in detail. Husband, daughter and Son at bedside Updated and notified of patients medical condition.  Patient remains unresponsive and will not open eyes to command.    Patient is having a weak cough and struggling to remove secretions.   Patient with increased WOB and using accessory muscles to breathe Explained to family course of therapy and the modalities     Patient with Progressive multiorgan failure with a very high probablity of a very minimal chance of meaningful recovery despite all aggressive and optimal medical therapy. Patient is in the Dying  Process associated with Suffering.  Family understands the situation.  They have consented and agreed to DNR/DNI and would like to proceed with Comfort care measures.  1)implement END OF LIFE VISITATION POLICY 2)WILL NOT PERFORM ANY MORE SAT/SBT's 3)ONE WAY EXTUBATION AND COMFORT MEASURES WHEN FAMILY IS READY  Family are satisfied with Plan of action and management. All questions answered  Additional CC time 42 mins   Kurian Santiago Glad, M.D.  [facility]  Medical Director [facility] Medical Director [facility] Cardio-Pulmonary Department

## 2020-02-29 LAB — CBC WITH DIFFERENTIAL/PLATELET
Abs Immature Granulocytes: 0.35 10*3/uL — ABNORMAL HIGH (ref 0.00–0.07)
Basophils Absolute: 0.1 10*3/uL (ref 0.0–0.1)
Basophils Relative: 0 %
Eosinophils Absolute: 0.1 10*3/uL (ref 0.0–0.5)
Eosinophils Relative: 0 %
HCT: 30.1 % — ABNORMAL LOW (ref 36.0–46.0)
Hemoglobin: 9.3 g/dL — ABNORMAL LOW (ref 12.0–15.0)
Immature Granulocytes: 1 %
Lymphocytes Relative: 1 %
Lymphs Abs: 0.2 10*3/uL — ABNORMAL LOW (ref 0.7–4.0)
MCH: 26.1 pg (ref 26.0–34.0)
MCHC: 30.9 g/dL (ref 30.0–36.0)
MCV: 84.6 fL (ref 80.0–100.0)
Monocytes Absolute: 0.6 10*3/uL (ref 0.1–1.0)
Monocytes Relative: 2 %
Neutro Abs: 24.6 10*3/uL — ABNORMAL HIGH (ref 1.7–7.7)
Neutrophils Relative %: 96 %
Platelets: 396 10*3/uL (ref 150–400)
RBC: 3.56 MIL/uL — ABNORMAL LOW (ref 3.87–5.11)
RDW: 20.3 % — ABNORMAL HIGH (ref 11.5–15.5)
Smear Review: NORMAL
WBC: 26 10*3/uL — ABNORMAL HIGH (ref 4.0–10.5)
nRBC: 0 % (ref 0.0–0.2)

## 2020-02-29 LAB — MAGNESIUM: Magnesium: 2.1 mg/dL (ref 1.7–2.4)

## 2020-02-29 LAB — RENAL FUNCTION PANEL
Albumin: 1.8 g/dL — ABNORMAL LOW (ref 3.5–5.0)
Anion gap: 6 (ref 5–15)
BUN: 20 mg/dL (ref 8–23)
CO2: 36 mmol/L — ABNORMAL HIGH (ref 22–32)
Calcium: 8.1 mg/dL — ABNORMAL LOW (ref 8.9–10.3)
Chloride: 97 mmol/L — ABNORMAL LOW (ref 98–111)
Creatinine, Ser: 0.54 mg/dL (ref 0.44–1.00)
GFR, Estimated: >60 mL/min (ref >60)
Glucose, Bld: 113 mg/dL — ABNORMAL HIGH (ref 70–99)
Phosphorus: 3.1 mg/dL (ref 2.5–4.6)
Potassium: 5.1 mmol/L (ref 3.5–5.1)
Sodium: 139 mmol/L (ref 135–145)

## 2020-02-29 LAB — FERRITIN: Ferritin: 130 ng/mL (ref 11–307)

## 2020-02-29 LAB — C-REACTIVE PROTEIN: CRP: 23.3 mg/dL — ABNORMAL HIGH (ref <1.0)

## 2020-02-29 LAB — PHOSPHORUS: Phosphorus: 3.2 mg/dL (ref 2.5–4.6)

## 2020-02-29 LAB — D-DIMER, QUANTITATIVE: D-Dimer, Quant: 17.02 ug/mL-FEU — ABNORMAL HIGH (ref 0.00–0.50)

## 2020-02-29 LAB — GLUCOSE, CAPILLARY
Glucose-Capillary: 99 mg/dL (ref 70–99)
Glucose-Capillary: 92 mg/dL (ref 70–99)

## 2020-02-29 MED ORDER — LORAZEPAM 2 MG/ML IJ SOLN
2.0000 mg | INTRAMUSCULAR | Status: DC | PRN
  Administered 2020-02-29: 4 mg via INTRAVENOUS
  Filled 2020-02-29: qty 2

## 2020-02-29 MED ORDER — LORAZEPAM 2 MG/ML IJ SOLN
INTRAMUSCULAR | Status: AC
  Administered 2020-02-29: 4 mg via INTRAVENOUS
  Filled 2020-02-29: qty 2

## 2020-02-29 MED ORDER — FUROSEMIDE 10 MG/ML IJ SOLN: 40.0000 mg | Freq: Two times a day (BID) | INTRAMUSCULAR | Status: DC

## 2020-02-29 MED ORDER — SODIUM CHLORIDE 0.9% FLUSH: 10.0000 mL | INTRAVENOUS | Status: DC | PRN

## 2020-02-29 MED ORDER — HYDROMORPHONE HCL 1 MG/ML IJ SOLN: 2.0000 mg | Freq: Once | INTRAMUSCULAR | Status: AC

## 2020-02-29 MED ORDER — ALBUMIN HUMAN 25 % IV SOLN: 12.5000 g | Freq: Every day | INTRAVENOUS | Status: DC

## 2020-02-29 MED ORDER — LORAZEPAM 2 MG/ML IJ SOLN
2.0000 mg | INTRAMUSCULAR | Status: DC | PRN
  Administered 2020-02-29: 2 mg via INTRAVENOUS
  Filled 2020-02-29: qty 1

## 2020-02-29 MED ORDER — GLYCOPYRROLATE 0.2 MG/ML IJ SOLN
0.4000 mg | INTRAMUSCULAR | Status: DC | PRN
  Administered 2020-02-29: 0.4 mg via INTRAVENOUS

## 2020-02-29 MED ORDER — SCOPOLAMINE 1 MG/3DAYS TD PT72
1.0000 | MEDICATED_PATCH | TRANSDERMAL | Status: DC
  Filled 2020-02-29: qty 1

## 2020-02-29 MED ORDER — HYDROMORPHONE HCL 1 MG/ML IJ SOLN
1.0000 mg | INTRAMUSCULAR | Status: DC | PRN
  Administered 2020-02-29: 1 mg via INTRAVENOUS
  Filled 2020-02-29: qty 1

## 2020-02-29 MED ORDER — LORAZEPAM 2 MG/ML IJ SOLN: 4.0000 mg | Freq: Once | INTRAMUSCULAR | Status: AC

## 2020-02-29 MED ORDER — HYDROMORPHONE HCL 1 MG/ML IJ SOLN
INTRAMUSCULAR | Status: AC
  Administered 2020-02-29: 2 mg via INTRAVENOUS
  Filled 2020-02-29: qty 2

## 2020-02-29 MED ORDER — MORPHINE SULFATE (PF) 4 MG/ML IV SOLN
4.0000 mg | Freq: Once | INTRAVENOUS | Status: AC
Start: 2020-02-29 — End: 2020-02-29
  Administered 2020-02-29: 4 mg via INTRAVENOUS

## 2020-02-29 MED ORDER — MORPHINE 100MG IN NS 100ML (1MG/ML) PREMIX INFUSION
1.0000 mg/h | INTRAVENOUS | Status: DC
  Administered 2020-02-29: 4 mg/h via INTRAVENOUS
  Filled 2020-02-29 (×2): qty 100

## 2020-02-29 MED ORDER — SODIUM CHLORIDE 0.9% FLUSH
10.0000 mL | Freq: Two times a day (BID) | INTRAVENOUS | Status: DC
  Administered 2020-02-29: 10 mL

## 2020-03-13 NOTE — Progress Notes (Signed)
CRITICAL CARE NOTE [name], presented to [facility] via EMS on 15 February 2020. Had been ill for approximately 2 weeks prior to admission. She had a positive COVID-19 home test on 04 February 2020. Patient was noted to be hypoxic in the field and placed on nonrebreather mask and transported to the ED. Still was showing positive Covid test on admission. PCCM consulted for February 2022 due to worsening respiratory failure. Patient required intubation and mechanical ventilation. Transfer to [facility] service.    Events: 02/18/2020-patient remains critically ill on MV wit PRVC, weaned FiO2 from 100 to 80% today. Sedation was light patient up sitting awake we advanced RASS goal to -3 to -4 for patient comfort.  02/19/2020-patient weaned to 70% today, no events today. Met with husband and reviewed hospital course, answered questions. He is happy and thankful for care. 02/20/2020-FiO2 down to 50%, PEEP at 8, still asynchronous when sedation is lightened 2/8 remains on vent 2/9 failed SAT/SBT, increased WOB, PATIENT DNR STATUS Dx of MSSA PNEUMONIA 2/10 severe hypoxia, unable to wean from vent 2/11 severe hypoxia, unable to wean from vent 2/12 severe resp failure, hypoxia 2/13 severe resp failure, husband at bedside updated 2/14 failed weaning trials 03/03/20- Met with family today with husband, daughter and son at bedside.  We discussed goals of care and family is agreeable to comfort measures after meeting with remainder of family.   CC  follow up respiratory failure  SUBJECTIVE Patient remains critically ill Prognosis is guarded  Vent Mode: PRVC FiO2 (%):  [45 %-60 %] 60 % Set Rate:  [15 bmp] 15 bmp Vt Set:  [450 mL] 450 mL PEEP:  [5 cmH20] 5 cmH20 Plateau Pressure:  [20 cmH20] 20 cmH20  CBC    Component Value Date/Time   WBC 26.0 (H) 03/03/2020 0550   RBC 3.56 (L) 2020/03/03 0550   HGB 9.3 (L) 03/03/2020 0550   HGB 10.5 (L) 10/19/2019 1138   HCT 30.1  (L) 03-03-2020 0550   HCT 32.7 (L) 10/19/2019 1138   PLT 396 03-03-20 0550   PLT 395 10/19/2019 1138   MCV 84.6 03-03-2020 0550   MCV 81 10/19/2019 1138   MCH 26.1 03/03/20 0550   MCHC 30.9 2020/03/03 0550   RDW 20.3 (H) March 03, 2020 0550   RDW 15.6 (H) 10/19/2019 1138   LYMPHSABS PENDING 03/03/20 0550   LYMPHSABS 0.9 10/19/2019 1138   MONOABS PENDING 03-03-2020 0550   EOSABS PENDING 03/03/2020 0550   EOSABS 0.1 10/19/2019 1138   BASOSABS PENDING Mar 03, 2020 0550   BASOSABS 0.1 10/19/2019 1138   BMP Latest Ref Rng & Units Mar 03, 2020 02/28/2020 02/27/2020  Glucose 70 - 99 mg/dL 113(H) 141(H) 96  BUN 8 - 23 mg/dL 20 35(H) 23  Creatinine 0.44 - 1.00 mg/dL 0.54 1.16(H) 0.55  BUN/Creat Ratio 12 - 28 - - -  Sodium 135 - 145 mmol/L 139 137 137  Potassium 3.5 - 5.1 mmol/L 5.1 4.7 4.5  Chloride 98 - 111 mmol/L 97(L) 94(L) 91(L)  CO2 22 - 32 mmol/L 36(H) 34(H) 36(H)  Calcium 8.9 - 10.3 mg/dL 8.1(L) 8.0(L) 8.0(L)    BP (!) 138/59   Pulse (!) 102   Temp 99.14 F (37.3 C)   Resp 20   Ht 5' 7.01" (1.702 m)   Wt 79.2 kg   SpO2 93%   BMI 27.34 kg/m    I/O last 3 completed shifts: In: 4098.4 [I.V.:1933.4; NG/GT:1865; IV [id number]:355] Out: 7322 [Urine:4325] No intake/output data recorded.  SpO2: 93 % O2 Flow  Rate (L/min): 15 L/min FiO2 (%): 60 %  Estimated body mass index is 27.34 kg/m as calculated from the following:   Height as of this encounter: 5' 7.01" (1.702 m).   Weight as of this encounter: 79.2 kg.  SIGNIFICANT EVENTS   REVIEW OF SYSTEMS  PATIENT IS UNABLE TO PROVIDE COMPLETE REVIEW OF SYSTEMS DUE TO SEVERE CRITICAL ILLNESS       PHYSICAL EXAMINATION: GENERAL:critically ill appearing, +resp distress NECK: Supple.  PULMONARY: mildly rhonchorous breathing CARDIOVASCULAR: S1 and S2.  GASTROINTESTINAL: Soft, nontender, +Positive bowel sounds.  MUSCULOSKELETAL: No swelling, clubbing, or edema.  NEUROLOGIC: obtunded,  GCS4T SKIN:intact,warm,dry     MEDICATIONS: I have reviewed all medications and confirmed regimen as documented   CULTURE RESULTS   No results found for this or any previous visit (from the past 240 hour(s)).        IMAGING    No results found.   Nutrition Status: Nutrition Problem: Inadequate oral intake Etiology: inability to eat (pt sedated and ventilated) Signs/Symptoms: NPO status       Indwelling Urinary Catheter continued, requirement due to   Reason to continue Indwelling Urinary Catheter strict Intake/Output monitoring for hemodynamic instability   Central Line/ continued, requirement due to  Reason to continue [place] of central venous pressure or other hemodynamic parameters and poor IV access   Ventilator continued, requirement due to severe respiratory failure   Ventilator Sedation RASS 0 to -2      ASSESSMENT AND PLAN SYNOPSIS   70 morbidly obese white female with severe and Acute hypoxemic respiratory failure due to COVID-19 pneumoniawith MSSA pneumoniaCOMPLICATED BY PROGRESSIVE MULTIPLE SCLEROSISfailure to wean from vent    Acute hypoxemic respiratory failure due to COVID-19 pneumonia/ARDS Mechanical ventilation via ARDS protocol, target PRVC 6 cc/kg Wean PEEP and FiO2 as able Goal plateau pressure less than 30, driving pressure less than 15 Deep sedation per PAD protocol Diuresis as tolerated based on Kidney function VAP prevention order set IV STEROIDS Therapy Follow inflammatory markers as needed with CRP Vitamin C, zinc Plan to repeat and check resp cultures as needed              -plan to de-escalate care per family - goals of care discusstion DNR with plan for comfort measures  Severe ACUTE Hypoxic and Hypercapnic Respiratory Failure -continue Full MV support -continue Bronchodilator Therapy -Wean Fio2 and PEEP as tolerated -will perform SAT/SBT when respiratory parameters are met -VAP/VENT bundle  implementation  ACUTE DIASTOLIC CARDIAC FAILURE-  -oxygen as needed -Lasix as tolerated   Morbid obesity, possible OSA.   Will certainly impact respiratory mechanics, ventilator weaning Suspect will need to consider additional PEEP   ACUTE KIDNEY INJURY/Renal Failure -continue Foley Catheter-assess need -Avoid nephrotoxic agents -Follow urine output, BMP -Ensure adequate renal perfusion, optimize oxygenation -Renal dose medications     NEUROLOGY Acute toxic metabolic encephalopathy, need for sedation Goal RASS -2 to -3  CARDIAC ICU monitoring   GI GI PROPHYLAXIS as indicated   DIET-->TF's as tolerated Constipation protocol as indicated  ENDO - will use ICU hypoglycemic\Hyperglycemia protocol if indicated    ELECTROLYTES -follow labs as needed -replace as needed -pharmacy consultation and following   DVT/GI PRX ordered and assessed TRANSFUSIONS AS NEEDED MONITOR FSBS I Assessed the need for Labs I Assessed the need for Foley I Assessed the need for Central Venous Line Family Discussion when available I Assessed the need for Mobilization I made an Assessment of medications to be adjusted accordingly Safety Risk  assessment completed   CASE DISCUSSED IN MULTIDISCIPLINARY ROUNDS WITH ICU TEAM  Critical Care Time devoted to patient care services described in this note is 109 minutes.   Overall, patient is critically ill, prognosis is guarded.  Patient with Multiorgan failure and at high risk for cardiac arrest and death.      Ottie Glazier, M.D.  Pulmonary & Saline

## 2020-03-13 NOTE — Progress Notes (Signed)
Pt was suctioned for a small amount of thick white secretions. She was extubated and placed on a nasal cannula.

## 2020-03-13 NOTE — Progress Notes (Signed)
No acute changes overnight. Pt remains on vent and fent + versed gtt's. Copious amounts of inline and oral sx's overnight. Turn q2. Pt in no acute distress @ this time. Will continue to monitor.

## 2020-03-13 NOTE — Death Summary Note (Addendum)
CRITICAL CARE NOTE [name], presented to [facility] via EMS on 15 February 2020. Had been ill for approximately 2 weeks prior to admission. She had a positive COVID-19 home test on 04 February 2020. Patient was noted to be hypoxic in the field and placed on nonrebreather mask and transported to the ED. Still was showing positive Covid test on admission. PCCM consulted for February 2022 due to worsening respiratory failure. Patient required intubation and mechanical ventilation. Transfer to [facility] service.             SEPSIS HAS BEEN RULED OUT  Events: 02/18/2020-patient remains critically ill on MV wit PRVC, weaned FiO2 from 100 to 80% today. Sedation was light patient up sitting awake we advanced RASS goal to -3 to -4 for patient comfort.  02/19/2020-patient weaned to 70% today, no events today. Met with husband and reviewed hospital course, answered questions. He is happy and thankful for care. 02/20/2020-FiO2 down to 50%, PEEP at 8, still asynchronous when sedation is lightened 2/8 remains on vent 2/9 failed SAT/SBT, increased WOB, PATIENT DNR STATUS Dx of MSSA PNEUMONIA 2/10 severe hypoxia, unable to wean from vent 2/11 severe hypoxia, unable to wean from vent 2/12 severe resp failure, hypoxia 2/13 severe resp failure, husband at bedside updated 2/14 failed weaning trials 03-05-2020- Met with family today with husband, daughter and son at bedside.  We discussed goals of care and family is agreeable to comfort measures after meeting with remainder of family.    PATIENT PASSED AWAY WITH FAMILY AT BEDSIDE ON COMFORT MEASURES AT 1653 03/05/20 - MAY SHE REST IN [place]  CC  follow up respiratory failure  SUBJECTIVE Patient remains critically ill Prognosis is guarded  Vent Mode: PRVC FiO2 (%):  [50 %-70 %] 70 % Set Rate:  [15 bmp] 15 bmp Vt Set:  [450 mL] 450 mL PEEP:  [5 cmH20] 5 cmH20 Plateau Pressure:  [20 cmH20] 20 cmH20  CBC    Component Value  Date/Time   WBC 26.0 (H) 03/05/20 0550   RBC 3.56 (L) 2020/03/05 0550   HGB 9.3 (L) 2020/03/05 0550   HGB 10.5 (L) 10/19/2019 1138   HCT 30.1 (L) 2020-03-05 0550   HCT 32.7 (L) 10/19/2019 1138   PLT 396 March 05, 2020 0550   PLT 395 10/19/2019 1138   MCV 84.6 Mar 05, 2020 0550   MCV 81 10/19/2019 1138   MCH 26.1 03/05/20 0550   MCHC 30.9 03/05/2020 0550   RDW 20.3 (H) 05-Mar-2020 0550   RDW 15.6 (H) 10/19/2019 1138   LYMPHSABS 0.2 (L) 03/05/20 0550   LYMPHSABS 0.9 10/19/2019 1138   MONOABS 0.6 03-05-2020 0550   EOSABS 0.1 03/05/20 0550   EOSABS 0.1 10/19/2019 1138   BASOSABS 0.1 03/05/20 0550   BASOSABS 0.1 10/19/2019 1138   BMP Latest Ref Rng & Units 2020-03-05 02/28/2020 02/27/2020  Glucose 70 - 99 mg/dL 113(H) 141(H) 96  BUN 8 - 23 mg/dL 20 35(H) 23  Creatinine 0.44 - 1.00 mg/dL 0.54 1.16(H) 0.55  BUN/Creat Ratio 12 - 28 - - -  Sodium 135 - 145 mmol/L 139 137 137  Potassium 3.5 - 5.1 mmol/L 5.1 4.7 4.5  Chloride 98 - 111 mmol/L 97(L) 94(L) 91(L)  CO2 22 - 32 mmol/L 36(H) 34(H) 36(H)  Calcium 8.9 - 10.3 mg/dL 8.1(L) 8.0(L) 8.0(L)    BP (!) 173/85   Pulse (!) 103   Temp (!) 97.52 F (36.4 C)   Resp (!) 23   Ht 5' 7.01" (1.702 m)  Wt 79.2 kg   SpO2 (!) 51%   BMI 27.34 kg/m    I/O last 3 completed shifts: In: 4098.4 [I.V.:1933.4; NG/GT:1865; IV [id number]:031] Out: 5945 [Urine:4325] No intake/output data recorded.  SpO2: (!) 51 % O2 Flow Rate (L/min): 15 L/min FiO2 (%): 70 %  Estimated body mass index is 27.34 kg/m as calculated from the following:   Height as of this encounter: 5' 7.01" (1.702 m).   Weight as of this encounter: 79.2 kg.  SIGNIFICANT EVENTS   REVIEW OF SYSTEMS  PATIENT IS UNABLE TO PROVIDE COMPLETE REVIEW OF SYSTEMS DUE TO SEVERE CRITICAL ILLNESS       PHYSICAL EXAMINATION: GENERAL:critically ill appearing, +resp distress NECK: Supple.  PULMONARY: mildly rhonchorous breathing CARDIOVASCULAR: S1 and S2.  GASTROINTESTINAL:  Soft, nontender, +Positive bowel sounds.  MUSCULOSKELETAL: No swelling, clubbing, or edema.  NEUROLOGIC: obtunded, GCS4T SKIN:intact,warm,dry     MEDICATIONS: I have reviewed all medications and confirmed regimen as documented   CULTURE RESULTS   No results found for this or any previous visit (from the past 240 hour(s)).        IMAGING    No results found.   Nutrition Status: Nutrition Problem: Inadequate oral intake Etiology: inability to eat (pt sedated and ventilated) Signs/Symptoms: NPO status       Indwelling Urinary Catheter continued, requirement due to   Reason to continue Indwelling Urinary Catheter strict Intake/Output monitoring for hemodynamic instability   Central Line/ continued, requirement due to  Reason to continue [place] of central venous pressure or other hemodynamic parameters and poor IV access   Ventilator continued, requirement due to severe respiratory failure   Ventilator Sedation RASS 0 to -2      ASSESSMENT AND PLAN SYNOPSIS   70 morbidly obese white female with severe and Acute hypoxemic respiratory failure due to COVID-19 pneumoniawith MSSA pneumoniaCOMPLICATED BY PROGRESSIVE MULTIPLE SCLEROSISfailure to wean from vent    Acute hypoxemic respiratory failure due to COVID-19 pneumonia/ARDS Mechanical ventilation via ARDS protocol, target PRVC 6 cc/kg Wean PEEP and FiO2 as able Goal plateau pressure less than 30, driving pressure less than 15 Deep sedation per PAD protocol Diuresis as tolerated based on Kidney function VAP prevention order set IV STEROIDS Therapy Follow inflammatory markers as needed with CRP Vitamin C, zinc Plan to repeat and check resp cultures as needed              -plan to de-escalate care per family - goals of care discusstion DNR with plan for comfort measures  Severe ACUTE Hypoxic and Hypercapnic Respiratory Failure -continue Full MV support -continue Bronchodilator  Therapy -Wean Fio2 and PEEP as tolerated -will perform SAT/SBT when respiratory parameters are met -VAP/VENT bundle implementation  ACUTE DIASTOLIC CARDIAC FAILURE-  -oxygen as needed -Lasix as tolerated   Morbid obesity, possible OSA.   Will certainly impact respiratory mechanics, ventilator weaning Suspect will need to consider additional PEEP   ACUTE KIDNEY INJURY/Renal Failure -continue Foley Catheter-assess need -Avoid nephrotoxic agents -Follow urine output, BMP -Ensure adequate renal perfusion, optimize oxygenation -Renal dose medications     NEUROLOGY Acute toxic metabolic encephalopathy, need for sedation Goal RASS -2 to -3  CARDIAC ICU monitoring   GI GI PROPHYLAXIS as indicated   DIET-->TF's as tolerated Constipation protocol as indicated  ENDO - will use ICU hypoglycemic\Hyperglycemia protocol if indicated    ELECTROLYTES -follow labs as needed -replace as needed -pharmacy consultation and following    Ottie Glazier, M.D.  Pulmonary & Critical Care Medicine  [place]

## 2020-03-13 DEATH — deceased

## 2020-04-16 ENCOUNTER — Ambulatory Visit: Payer: Medicare PPO | Admitting: Neurology

## 2022-01-03 IMAGING — DX DG CHEST 1V
1 series · 1 of 1 positions shown · non-contrast
Comparison: February 17, 2020

CLINICAL DATA: ETT

EXAM:
CHEST  1 VIEW

[chest ap]
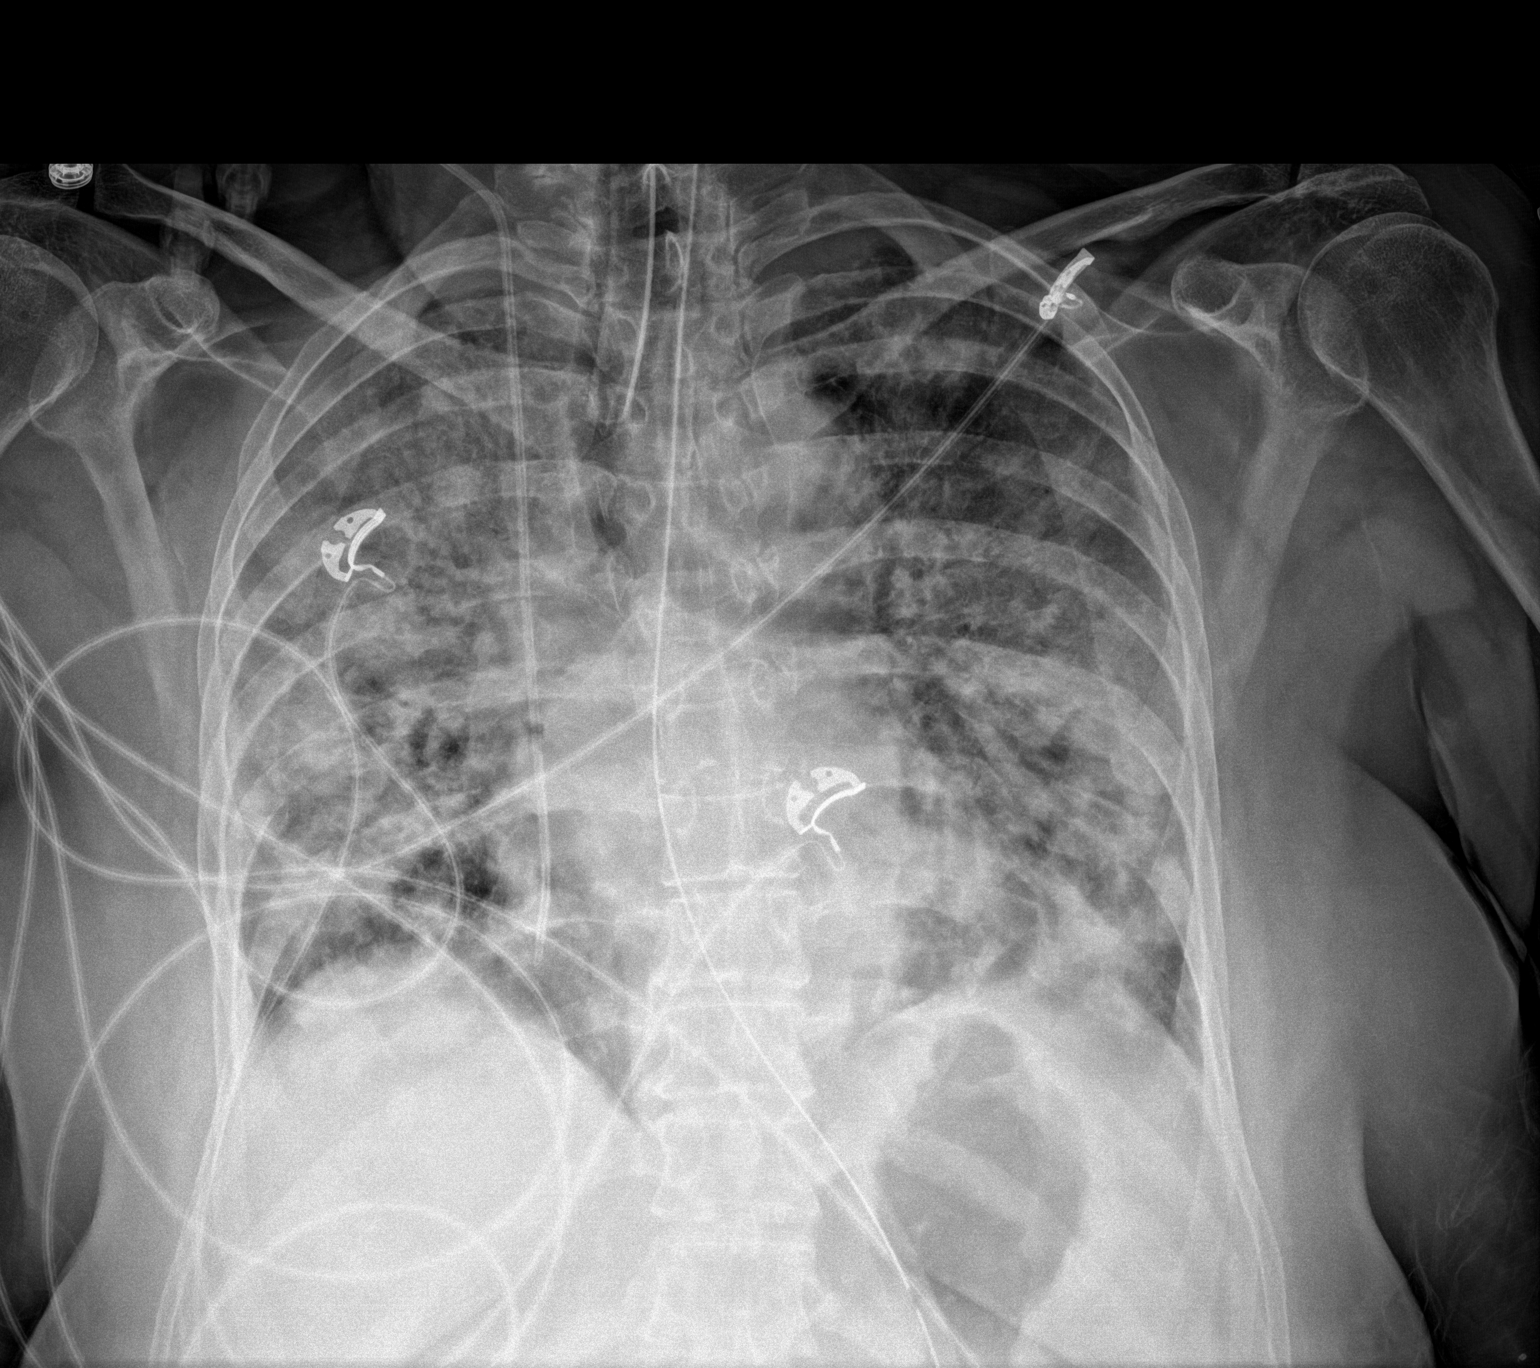

[1 of 1 positions shown; findings below may reference images not displayed]

FINDINGS: The cardiomediastinal silhouette is unchanged in contour.The tip
terminates 3.7 cm above the carina. Enteric tube side port projects
over the proximal stomach. RIGHT IJ CVC tip terminates over the
RIGHT atrium. No pleural effusion. No pneumothorax. Diffuse
bilateral heterogeneous opacities, unchanged. Visualized abdomen is
unremarkable. No acute osseous abnormality
IMPRESSION: 1.  Support apparatus as described above.
2. Unchanged diffuse bilateral heterogeneous opacities consistent
with the sequela of [id number] infection.

## 2022-01-07 IMAGING — DX DG CHEST 1V PORT
1 series · 2 of 2 positions shown · non-contrast
Comparison: 02/17/2020

CLINICAL DATA: Acute respiratory failure with hypoxia.

EXAM:
PORTABLE CHEST 1 VIEW

[Series 1: chest ap · 0.14mm/px · 2 of 2 slices shown]
[im 1/2]
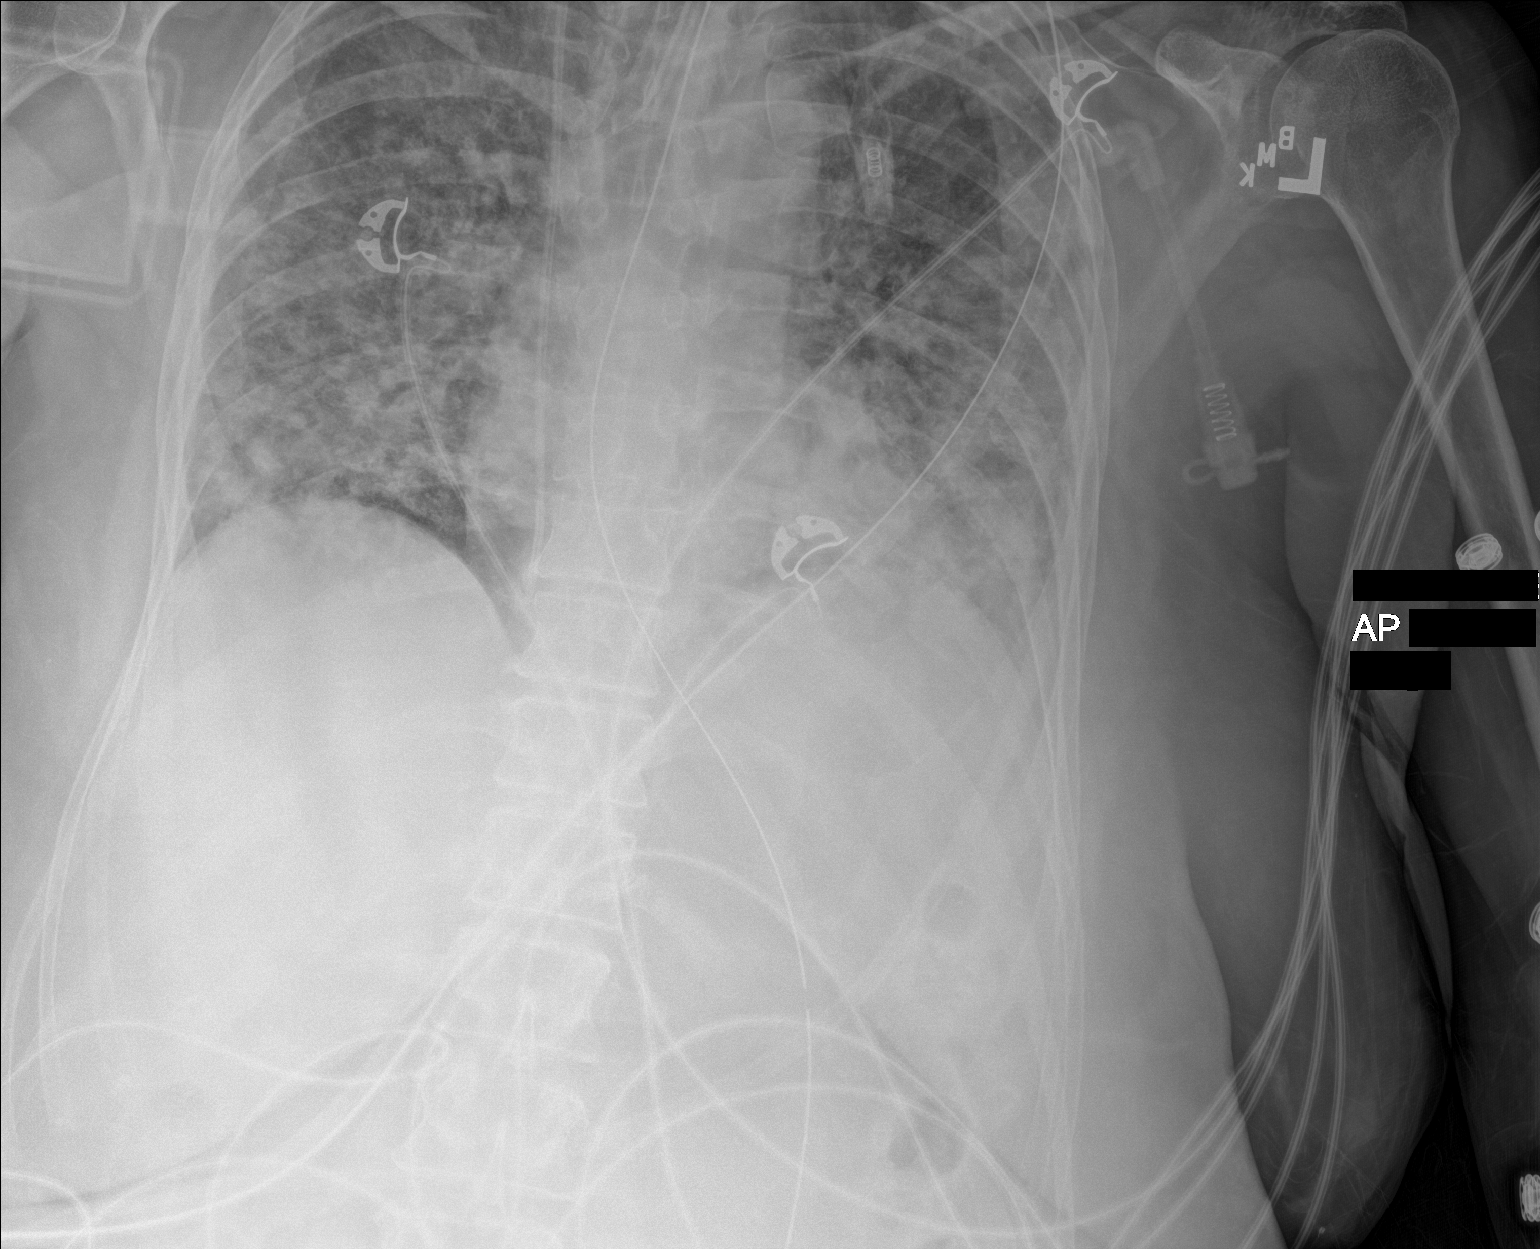
[im 2/2]
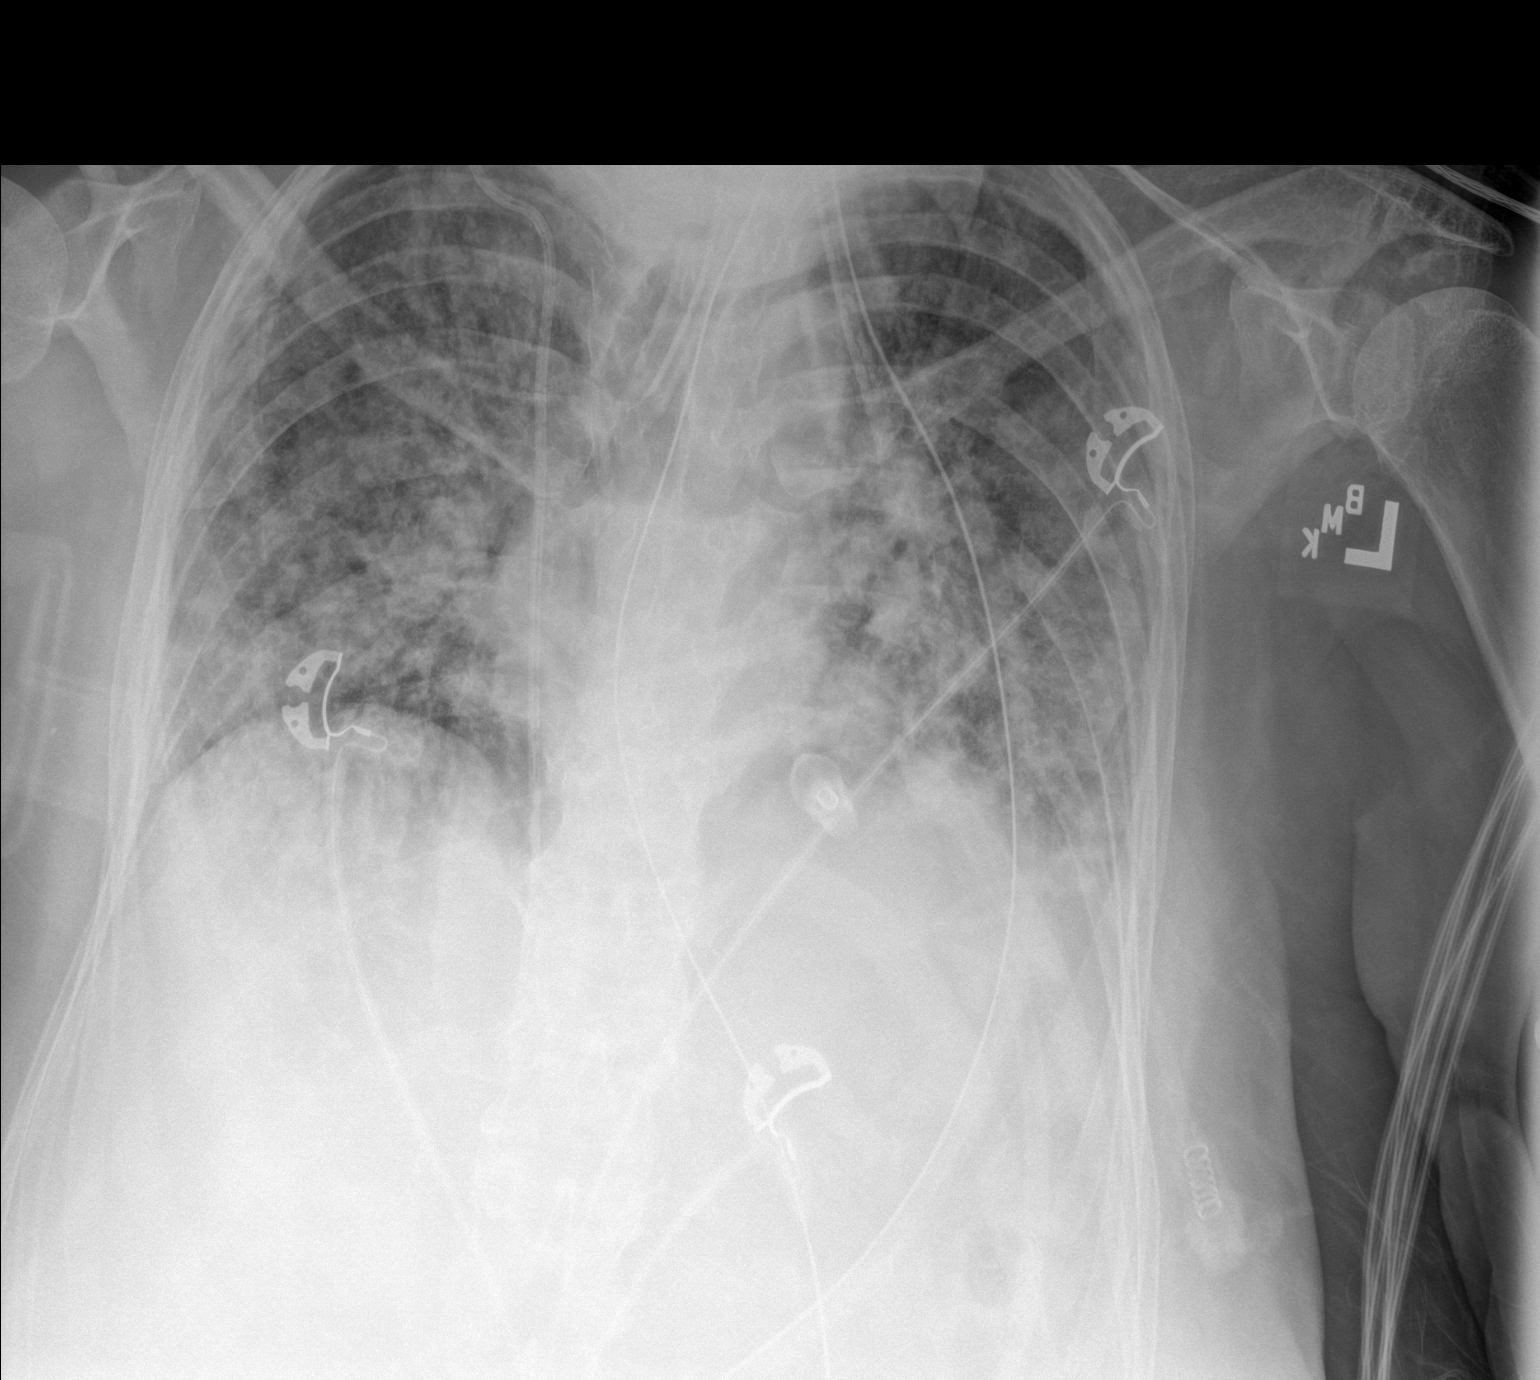

[2 of 2 positions shown; findings below may reference images not displayed]

FINDINGS: The endotracheal tube, central venous catheter, and enteric tube all
appear to be grossly stable in positioning. There are diffuse
bilateral hazy airspace opacities, not substantially changed from
prior study. No pneumothorax. No large pleural effusion.
IMPRESSION: 1. Essentially stable lines and tubes.
2. No significant interval change.

## 2022-01-14 IMAGING — DX DG CHEST 1V PORT
1 series · 1 of 1 positions shown · non-contrast
Comparison: 02/21/2020.

CLINICAL DATA: Acute respiratory failure.  Hypoxia.

EXAM:
PORTABLE CHEST 1 VIEW

[chest ap]
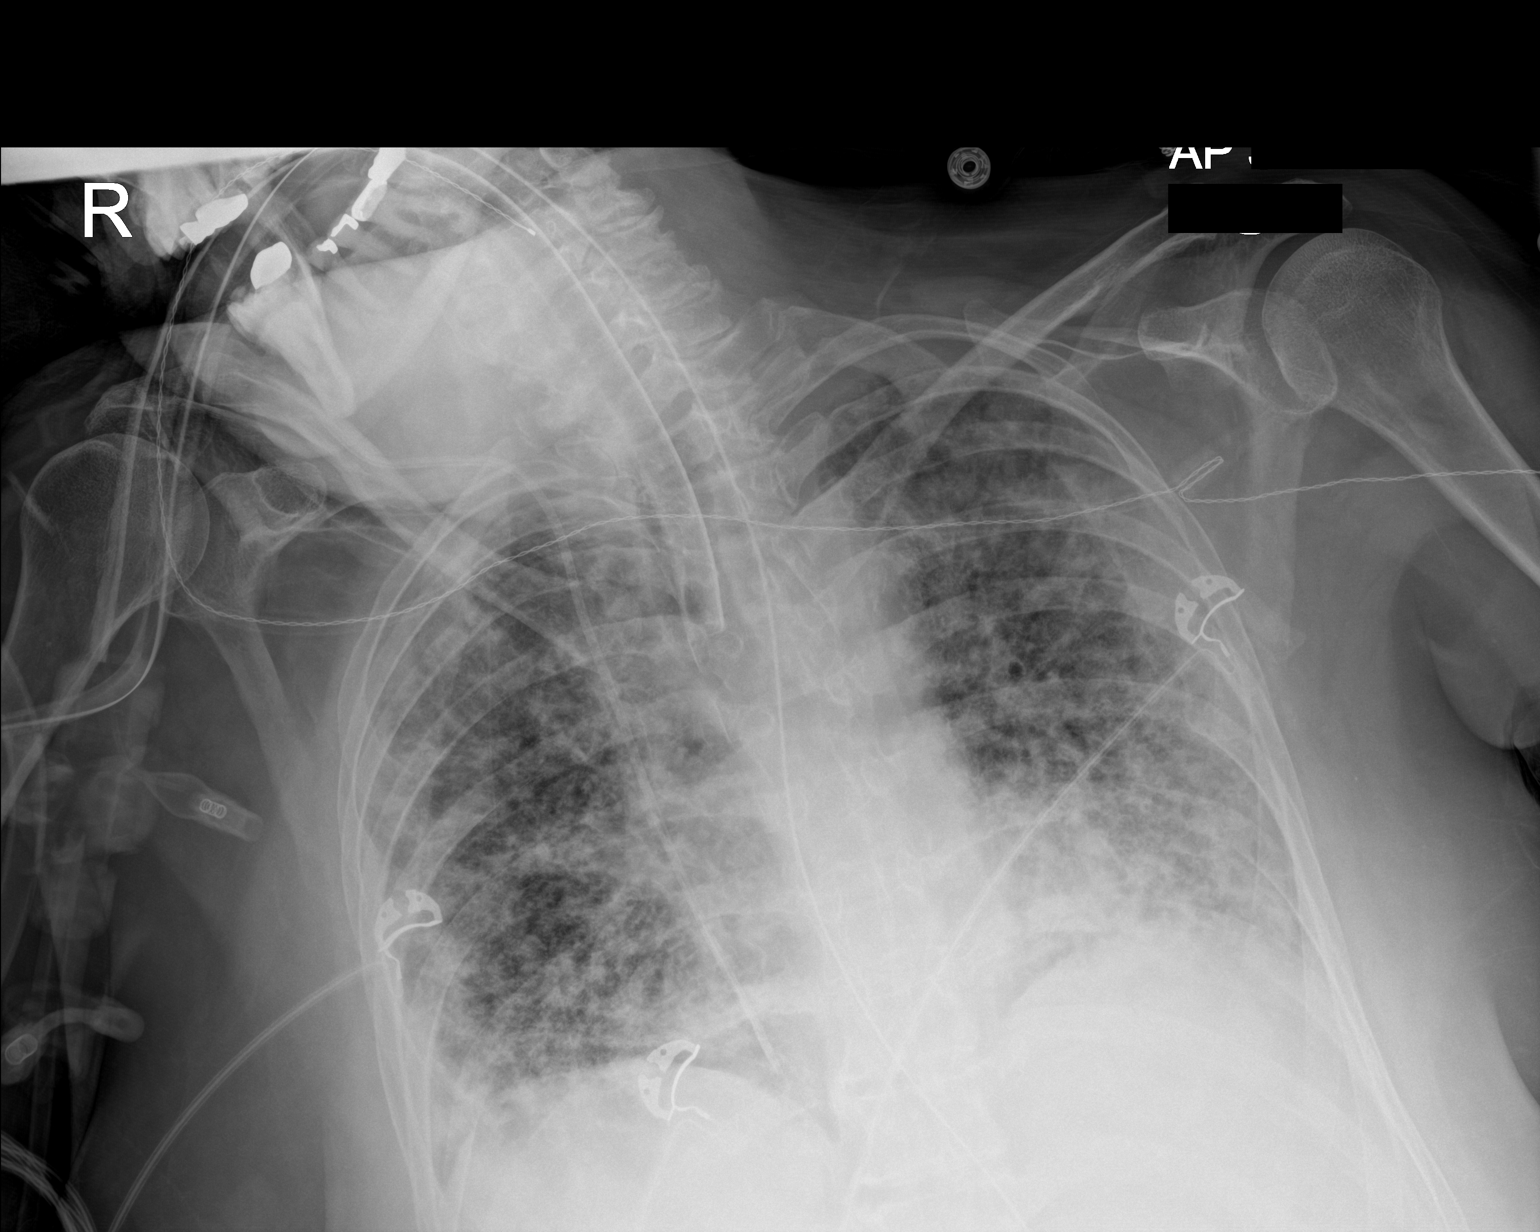

[1 of 1 positions shown; findings below may reference images not displayed]

FINDINGS: Endotracheal tube and NG tube in stable position. Right IJ line
noted with tip over right atrium in unchanged position. Heart size
stable. Diffuse severe bilateral interstitial infiltrates again
noted without interim change. No pleural effusion or pneumothorax.
IMPRESSION: 1. Endotracheal tube and NG tube in stable position. Right IJ line
noted with tip over the right atrium in unchanged position.
2. Diffuse severe bilateral interstitial infiltrates again noted
without interim change.

## 2022-09-14 DEATH — deceased
